# Patient Record
Sex: Male | Born: 1935
Health system: Southern US, Community
[De-identification: ages and names within clinical notes are randomized; demographics above are authoritative.]

## PROBLEM LIST (undated history)

## (undated) DIAGNOSIS — M1990 Unspecified osteoarthritis, unspecified site: Secondary | ICD-10-CM

## (undated) DIAGNOSIS — E785 Hyperlipidemia, unspecified: Secondary | ICD-10-CM

## (undated) DIAGNOSIS — R011 Cardiac murmur, unspecified: Secondary | ICD-10-CM

## (undated) DIAGNOSIS — K635 Polyp of colon: Secondary | ICD-10-CM

## (undated) DIAGNOSIS — I4891 Unspecified atrial fibrillation: Secondary | ICD-10-CM

## (undated) DIAGNOSIS — I2692 Saddle embolus of pulmonary artery without acute cor pulmonale: Principal | ICD-10-CM

## (undated) DIAGNOSIS — I10 Essential (primary) hypertension: Secondary | ICD-10-CM

## (undated) DIAGNOSIS — I714 Abdominal aortic aneurysm, without rupture: Secondary | ICD-10-CM

## (undated) HISTORY — DX: Saddle embolus of pulmonary artery without acute cor pulmonale: I26.92

## (undated) HISTORY — DX: Hyperlipidemia, unspecified: E78.5

## (undated) HISTORY — DX: Polyp of colon: K63.5

## (undated) HISTORY — DX: Unspecified atrial fibrillation: I48.91

## (undated) HISTORY — PX: TONSILLECTOMY: SUR1361

## (undated) HISTORY — DX: Abdominal aortic aneurysm, without rupture: I71.4

## (undated) HISTORY — DX: Cardiac murmur, unspecified: R01.1

## (undated) HISTORY — DX: Unspecified osteoarthritis, unspecified site: M19.90

## (undated) HISTORY — DX: Essential (primary) hypertension: I10

---

## 1998-08-09 ENCOUNTER — Ambulatory Visit (HOSPITAL_COMMUNITY): Admission: RE | Admit: 1998-08-09 | Discharge: 1998-08-09 | Payer: Self-pay | Admitting: Family Medicine

## 1998-08-10 ENCOUNTER — Ambulatory Visit (HOSPITAL_COMMUNITY): Admission: RE | Admit: 1998-08-10 | Discharge: 1998-08-10 | Payer: Self-pay | Admitting: *Deleted

## 1998-08-10 ENCOUNTER — Encounter: Payer: Self-pay | Admitting: Family Medicine

## 1998-09-20 ENCOUNTER — Ambulatory Visit (HOSPITAL_COMMUNITY): Admission: RE | Admit: 1998-09-20 | Discharge: 1998-09-20 | Payer: Self-pay | Admitting: Gastroenterology

## 1998-09-20 ENCOUNTER — Encounter: Payer: Self-pay | Admitting: Gastroenterology

## 1998-10-25 ENCOUNTER — Ambulatory Visit (HOSPITAL_COMMUNITY): Admission: RE | Admit: 1998-10-25 | Discharge: 1998-10-25 | Payer: Self-pay | Admitting: Gastroenterology

## 2008-08-03 ENCOUNTER — Encounter: Admission: RE | Admit: 2008-08-03 | Discharge: 2008-08-03 | Payer: Self-pay | Admitting: Family Medicine

## 2008-08-16 ENCOUNTER — Encounter
Admission: RE | Admit: 2008-08-16 | Discharge: 2008-10-06 | Payer: Self-pay | Admitting: Physical Medicine and Rehabilitation

## 2009-03-08 ENCOUNTER — Encounter: Admission: RE | Admit: 2009-03-08 | Discharge: 2009-04-28 | Payer: Self-pay | Admitting: Family Medicine

## 2009-05-10 ENCOUNTER — Encounter (INDEPENDENT_AMBULATORY_CARE_PROVIDER_SITE_OTHER): Payer: Self-pay | Admitting: *Deleted

## 2010-05-30 NOTE — Letter (Signed)
Summary: Colonoscopy Date Change Letter  [facility]  [address] [place], [place] [address]   Phone: [phone]  Fax: [phone]      May 10, 2009 MRN: [id number]   Jerry Fox [address] [place], [place]  [address]   Dear [name],   Previously you were recommended to have a repeat colonoscopy around this time. Your chart was recently reviewed by Dr. Judie Petit T. Russella Dar of [facility]. Follow up colonoscopy is now recommended in February 2014. This revised recommendation is based on current, nationally recognized guidelines for colorectal cancer screening and polyp surveillance. These guidelines are endorsed by the American Cancer Society, [employer] on Colorectal Cancer as well as numerous other major medical organizations.  Please understand that our recommendation assumes that you do not have any new symptoms such as bleeding, a change in bowel habits, anemia, or significant abdominal discomfort. If you do have any concerning GI symptoms or want to discuss the guideline recommendations, please call to arrange an office visit at your earliest convenience. Otherwise we will keep you in our reminder system and contact you 1-2 months prior to the date listed above to schedule your next colonoscopy.  Thank you,  Judie Petit T. Russella Dar, M.D.  [facility] [phone]

## 2010-06-13 ENCOUNTER — Other Ambulatory Visit: Payer: Self-pay | Admitting: Nurse Practitioner

## 2010-06-13 DIAGNOSIS — M25512 Pain in left shoulder: Secondary | ICD-10-CM

## 2010-06-17 ENCOUNTER — Other Ambulatory Visit: Payer: Self-pay

## 2010-07-08 ENCOUNTER — Ambulatory Visit
Admission: RE | Admit: 2010-07-08 | Discharge: 2010-07-08 | Disposition: A | Discharge status: Home / Self Care | Payer: BC Managed Care – PPO | Source: Ambulatory Visit | Attending: Nurse Practitioner | Admitting: Nurse Practitioner

## 2010-07-08 DIAGNOSIS — M25512 Pain in left shoulder: Secondary | ICD-10-CM

## 2011-02-01 ENCOUNTER — Ambulatory Visit (INDEPENDENT_AMBULATORY_CARE_PROVIDER_SITE_OTHER): Payer: Medicare Other | Admitting: Family Medicine

## 2011-02-01 ENCOUNTER — Encounter: Payer: Self-pay | Admitting: Family Medicine

## 2011-02-01 VITALS — BP 160/90 | HR 72 | Temp 97.5°F | Resp 12 | Ht 68.75 in | Wt 192.0 lb

## 2011-02-01 DIAGNOSIS — E785 Hyperlipidemia, unspecified: Secondary | ICD-10-CM | POA: Insufficient documentation

## 2011-02-01 DIAGNOSIS — M545 Low back pain, unspecified: Secondary | ICD-10-CM

## 2011-02-01 DIAGNOSIS — G8929 Other chronic pain: Secondary | ICD-10-CM

## 2011-02-01 DIAGNOSIS — Z23 Encounter for immunization: Secondary | ICD-10-CM

## 2011-02-01 DIAGNOSIS — M1990 Unspecified osteoarthritis, unspecified site: Secondary | ICD-10-CM | POA: Insufficient documentation

## 2011-02-01 DIAGNOSIS — I10 Essential (primary) hypertension: Secondary | ICD-10-CM | POA: Insufficient documentation

## 2011-02-01 LAB — BASIC METABOLIC PANEL
CO2: 32 mEq/L (ref 19–32)
Calcium: 10 mg/dL (ref 8.4–10.5)
Chloride: 102 mEq/L (ref 96–112)
Glucose, Bld: 110 mg/dL — ABNORMAL HIGH (ref 70–99)
Sodium: 141 mEq/L (ref 135–145)

## 2011-02-01 LAB — LIPID PANEL
HDL: 50.7 mg/dL (ref >39.00)
LDL Cholesterol: 95 mg/dL (ref 0–99)
Total CHOL/HDL Ratio: 3
VLDL: 24.2 mg/dL (ref 0.0–40.0)

## 2011-02-01 LAB — HEPATIC FUNCTION PANEL: Total Bilirubin: 0.8 mg/dL (ref 0.3–1.2)

## 2011-02-01 MED ORDER — LISINOPRIL 10 MG PO TABS: 10.0000 mg | ORAL_TABLET | Freq: Every day | ORAL | Status: DC

## 2011-02-01 NOTE — Progress Notes (Signed)
Subjective:    Patient ID: Jerry Fox, male    DOB: 1933-11-19, 77 y.o.   MRN: [id number]  HPI New to establish care. Past medical history reviewed. History of osteoarthritis, chronic low back pain, hypertension, hyperlipidemia, reported history of positive PPD treated and history of colon polyps. Medications reviewed. Surgical history tonsillectomy in childhood.  Blood pressures have been running slightly high at home 140s to 150s systolic. No headaches or dizziness. Patient gives what sounds like remote history of nonobstructive CAD by catheterization several years ago. No lab work in about a year. Takes lovastatin for hyperlipidemia. No myalgias. Osteoarthritis mostly involving the lower lumbar region and hands.  Family history significant for both parents with coronary disease. Father hypertension.  Patient is retired from [employer]. Nonsmoker. Did smoke and quit around age 75. No alcohol use. Last tetanus 5 years ago. Pneumovax 3 years ago. No flu vaccine yet.  Past Medical History  Diagnosis Date  . Arthritis   . Murmur, cardiac   . Hypertension   . Colon polyps    Past Surgical History  Procedure Date  . Tonsillectomy     reports that he quit smoking about 57 years ago. His smoking use included Cigarettes. He has a 10 pack-year smoking history. He quit smokeless tobacco use about 47 years ago. His smokeless tobacco use included Chew. His alcohol and drug histories not on file. family history includes Arthritis in his father and mother; Heart disease in his father, mother, and paternal grandfather; and Hypertension in his father. No Known Allergies    Review of Systems  Constitutional: Negative for fever, chills, appetite change, fatigue and unexpected weight change.  HENT: Negative for trouble swallowing.   Eyes: Negative for visual disturbance.  Respiratory: Negative for cough and shortness of breath.   Cardiovascular: Negative for chest pain, palpitations  and leg swelling.  Gastrointestinal: Negative for abdominal pain.  Genitourinary: Negative for dysuria.  Musculoskeletal: Positive for back pain and arthralgias. Negative for myalgias and gait problem.  Skin: Negative for rash.  Neurological: Negative for dizziness, syncope and headaches.  Hematological: Negative for adenopathy.  Psychiatric/Behavioral: Negative for dysphoric mood.       Objective:   Physical Exam  Constitutional: He is oriented to person, place, and time. He appears well-developed and well-nourished.  HENT:  Right Ear: External ear normal.  Left Ear: External ear normal.  Mouth/Throat: Oropharynx is clear and moist.  Neck: Neck supple. No thyromegaly present.  Cardiovascular: Normal rate and regular rhythm.  Exam reveals no gallop.   Pulmonary/Chest: Effort normal and breath sounds normal. No respiratory distress. He has no wheezes. He has no rales.  Abdominal: Soft. There is no tenderness.  Musculoskeletal: He exhibits no edema.  Lymphadenopathy:    He has no cervical adenopathy.  Neurological: He is alert and oriented to person, place, and time.  Psychiatric: He has a normal mood and affect. His behavior is normal.          Assessment & Plan:  #1 hyperlipidemia. Check lipid and hepatic panel #2 hypertension suboptimally controlled. Add lisinopril 10 mg daily and reassess blood pressure approximately 1 month #3 osteoarthritis involving mostly low back and hands. Patient's been maintained on hydrocodone for years and takes intermittent diazepam for muscle spasm. Send for old records. We explained if we take over pain medication have to have clear change of command regarding who is in charge of that and only through one practice and one provider much as possible.  #4 health  maintenance. Flu vaccine recommended and patient consents

## 2011-02-02 NOTE — Progress Notes (Signed)
Quick Note:  Pt informed ______ 

## 2011-03-06 ENCOUNTER — Ambulatory Visit (INDEPENDENT_AMBULATORY_CARE_PROVIDER_SITE_OTHER): Payer: Medicare Other | Admitting: Family Medicine

## 2011-03-06 ENCOUNTER — Encounter: Payer: Self-pay | Admitting: Family Medicine

## 2011-03-06 VITALS — BP 142/80 | Temp 97.7°F | Wt 194.0 lb

## 2011-03-06 DIAGNOSIS — I10 Essential (primary) hypertension: Secondary | ICD-10-CM

## 2011-03-06 NOTE — Progress Notes (Signed)
  Subjective:    Patient ID: Jerry Fox, male    DOB: Jul 18, 1933, 77 y.o.   MRN: [id number]  HPI  Patient seen for followup hypertension. Added lisinopril last visit. No cough or other side effect. Blood pressure is improved with home readings consistently less than 140 systolic. No dizziness or headache. Compliant with all medications. He is trying to taper back his diazepam which he has taken for back muscle spasms in the past. Has already received flu vaccine.  Review of Systems  Constitutional: Negative for fatigue.  Eyes: Negative for visual disturbance.  Respiratory: Negative for cough, chest tightness and shortness of breath.   Cardiovascular: Negative for chest pain, palpitations and leg swelling.  Neurological: Negative for dizziness, syncope, weakness, light-headedness and headaches.       Objective:   Physical Exam  Constitutional: He appears well-developed and well-nourished. No distress.  HENT:  Mouth/Throat: Oropharynx is clear and moist.  Neck: Neck supple. No thyromegaly present.  Cardiovascular: Normal rate and regular rhythm.   Pulmonary/Chest: Effort normal and breath sounds normal. No respiratory distress. He has no wheezes. He has no rales.  Musculoskeletal: He exhibits no edema.  Lymphadenopathy:    He has no cervical adenopathy.          Assessment & Plan:  Hypertension. Improved. Continue current medications. Continue close home monitoring. Routine follow up 6 months.

## 2011-03-06 NOTE — Patient Instructions (Signed)
Keep monitoring blood pressure and be in touch if BP > 140/90.

## 2011-04-12 ENCOUNTER — Other Ambulatory Visit: Payer: Self-pay | Admitting: Family Medicine

## 2011-04-12 NOTE — Telephone Encounter (Signed)
Has taken diazepam and hydrocodone intermittently and rarely for back pain flares May refill diazepam 5 mg one po bid prn muscle spasm #30 with one refill and Norco 5/325 mg 1-2 po q6 hours prn pain #30 with one refill. Refill other requested meds for 6 months.

## 2011-04-12 NOTE — Telephone Encounter (Signed)
Pt has not had these meds filled in [facility] or [facility] by [place], Valium and Hydrocodone Please advise

## 2011-04-12 NOTE — Telephone Encounter (Signed)
Pt called req refills of diazepam (VALIUM) 5 MG tablet, hydrochlorothiazide (HYDRODIURIL) 25 MG tablet,HYDROcodone-acetaminophen (NORCO) 5-325 MG per tablet ,lovastatin (MEVACOR) 20 MG tablet ,metoprolol tartrate (LOPRESSOR) 25 MG tablet to [facility] in [place].

## 2011-04-13 MED ORDER — HYDROCHLOROTHIAZIDE 25 MG PO TABS: 25.0000 mg | ORAL_TABLET | Freq: Every day | ORAL | Status: DC

## 2011-04-13 MED ORDER — DIAZEPAM 5 MG PO TABS: 5.0000 mg | ORAL_TABLET | Freq: Four times a day (QID) | ORAL | Status: DC | PRN

## 2011-04-13 MED ORDER — HYDROCODONE-ACETAMINOPHEN 5-325 MG PO TABS: 1.0000 | ORAL_TABLET | Freq: Four times a day (QID) | ORAL | Status: DC | PRN

## 2011-04-13 MED ORDER — METOPROLOL TARTRATE 25 MG PO TABS: 25.0000 mg | ORAL_TABLET | Freq: Every day | ORAL | Status: DC

## 2011-04-13 MED ORDER — LOVASTATIN 20 MG PO TABS: 20.0000 mg | ORAL_TABLET | Freq: Every day | ORAL | Status: DC

## 2011-04-13 MED ORDER — LISINOPRIL 10 MG PO TABS: 10.0000 mg | ORAL_TABLET | Freq: Every day | ORAL | Status: DC

## 2011-05-31 ENCOUNTER — Other Ambulatory Visit: Payer: Self-pay | Admitting: *Deleted

## 2011-05-31 NOTE — Telephone Encounter (Signed)
Diazepam last filled on 04-13-11, #30 with 1 refill

## 2011-06-05 NOTE — Telephone Encounter (Signed)
Refill for 3 months. 

## 2011-06-06 MED ORDER — DIAZEPAM 5 MG PO TABS: 5.0000 mg | ORAL_TABLET | Freq: Four times a day (QID) | ORAL | Status: DC | PRN

## 2011-06-19 ENCOUNTER — Telehealth: Payer: Self-pay | Admitting: Family Medicine

## 2011-06-19 NOTE — Telephone Encounter (Signed)
Follow up to discuss.

## 2011-06-19 NOTE — Telephone Encounter (Signed)
HYDROcodone-acetaminophen - pt wants to get dosage increased say still having a lot of pain//also same request for muscle relaxer. Or is there something else he can try?

## 2011-06-19 NOTE — Telephone Encounter (Signed)
Please advise 

## 2011-06-20 NOTE — Telephone Encounter (Signed)
Pt wife informed

## 2011-06-27 ENCOUNTER — Encounter: Payer: Self-pay | Admitting: Family Medicine

## 2011-06-27 ENCOUNTER — Ambulatory Visit (INDEPENDENT_AMBULATORY_CARE_PROVIDER_SITE_OTHER): Payer: Medicare Other | Admitting: Family Medicine

## 2011-06-27 DIAGNOSIS — I10 Essential (primary) hypertension: Secondary | ICD-10-CM

## 2011-06-27 DIAGNOSIS — M545 Low back pain, unspecified: Secondary | ICD-10-CM

## 2011-06-27 DIAGNOSIS — E785 Hyperlipidemia, unspecified: Secondary | ICD-10-CM

## 2011-06-27 DIAGNOSIS — M1990 Unspecified osteoarthritis, unspecified site: Secondary | ICD-10-CM

## 2011-06-27 DIAGNOSIS — G8929 Other chronic pain: Secondary | ICD-10-CM

## 2011-06-27 MED ORDER — DIAZEPAM 5 MG PO TABS: 5.0000 mg | ORAL_TABLET | Freq: Four times a day (QID) | ORAL | Status: DC | PRN

## 2011-06-27 MED ORDER — HYDROCODONE-ACETAMINOPHEN 5-325 MG PO TABS: 1.0000 | ORAL_TABLET | Freq: Four times a day (QID) | ORAL | Status: DC | PRN

## 2011-06-27 NOTE — Progress Notes (Signed)
  Subjective:    Patient ID: Jerry Fox, male    DOB: 02/26/1934, 78 y.o.   MRN: [id number]  HPI  Medical followup. Patient has history of hypertension, hyperlipidemia, and osteoarthritis mostly involving lumbar and cervical spine. Has had previous x-rays including MRI scans. He has been on regimen of diazepam and hydrocodone which he takes bid. He has frequent muscle spasms in his back mostly lumbar area. Diazepam seems to working well. No problems with gait. No sedation issues. No history of misuse. Generally takes one hydrocodone twice daily. Without this he had severe sleep difficulty. Previous intolerance to nonsteroidals. No relief with Tylenol.  Hypertension treated with lisinopril, HCTZ, and metoprolol. Blood pressures been well controlled by home readings. Lipids were checked last fall and at goal  Past Medical History  Diagnosis Date  . Arthritis   . Murmur, cardiac   . Hypertension   . Colon polyps    Past Surgical History  Procedure Date  . Tonsillectomy     reports that he quit smoking about 57 years ago. His smoking use included Cigarettes. He has a 10 pack-year smoking history. He quit smokeless tobacco use about 47 years ago. His smokeless tobacco use included Chew. His alcohol and drug histories not on file. family history includes Arthritis in his father and mother; Heart disease in his father, mother, and paternal grandfather; and Hypertension in his father. No Known Allergies    Review of Systems  HENT: Positive for neck pain.   Respiratory: Negative for cough and shortness of breath.   Cardiovascular: Negative for chest pain, palpitations and leg swelling.  Gastrointestinal: Negative for abdominal pain.  Musculoskeletal: Positive for back pain.  Neurological: Negative for dizziness.       Objective:   Physical Exam  Constitutional: He is oriented to person, place, and time. He appears well-developed and well-nourished.  Neck: Neck supple. No  thyromegaly present.  Cardiovascular: Normal rate and regular rhythm.   Pulmonary/Chest: Effort normal and breath sounds normal. No respiratory distress. He has no wheezes. He has no rales.  Musculoskeletal: He exhibits no edema.  Lymphadenopathy:    He has no cervical adenopathy.  Neurological: He is alert and oriented to person, place, and time.          Assessment & Plan:  #1Osteoarthritis involving cervical and lumbar spine. We've refilled his diazepam and hydrocodone. He knows to get this through one office only. Cautioned about potential side effects.  #2 hypertension stable #3 hyperlipidemia recent lipids reviewed with patient and at goal. Repeat lipid and hepatic panel in 6 months

## 2011-09-03 ENCOUNTER — Ambulatory Visit: Payer: Medicare Other | Admitting: Family Medicine

## 2011-11-21 ENCOUNTER — Encounter: Payer: Self-pay | Admitting: Family Medicine

## 2011-11-21 ENCOUNTER — Ambulatory Visit (INDEPENDENT_AMBULATORY_CARE_PROVIDER_SITE_OTHER): Payer: Medicare Other | Admitting: Family Medicine

## 2011-11-21 VITALS — BP 120/68 | Temp 97.7°F | Wt 198.0 lb

## 2011-11-21 DIAGNOSIS — G8929 Other chronic pain: Secondary | ICD-10-CM

## 2011-11-21 DIAGNOSIS — R5383 Other fatigue: Secondary | ICD-10-CM

## 2011-11-21 DIAGNOSIS — R5381 Other malaise: Secondary | ICD-10-CM

## 2011-11-21 DIAGNOSIS — M1990 Unspecified osteoarthritis, unspecified site: Secondary | ICD-10-CM

## 2011-11-21 DIAGNOSIS — I10 Essential (primary) hypertension: Secondary | ICD-10-CM

## 2011-11-21 DIAGNOSIS — M545 Low back pain: Secondary | ICD-10-CM

## 2011-11-21 DIAGNOSIS — E785 Hyperlipidemia, unspecified: Secondary | ICD-10-CM

## 2011-11-21 LAB — LIPID PANEL
Cholesterol: 143 mg/dL (ref 0–200)
Total CHOL/HDL Ratio: 3
Triglycerides: 140 mg/dL (ref 0.0–149.0)

## 2011-11-21 LAB — HEPATIC FUNCTION PANEL
AST: 22 U/L (ref 0–37)
Albumin: 4.1 g/dL (ref 3.5–5.2)
Alkaline Phosphatase: 51 U/L (ref 39–117)
Total Protein: 7.2 g/dL (ref 6.0–8.3)

## 2011-11-21 LAB — BASIC METABOLIC PANEL
Calcium: 9.6 mg/dL (ref 8.4–10.5)
Creatinine, Ser: 1 mg/dL (ref 0.4–1.5)
GFR: 77.23 mL/min (ref >60.00)

## 2011-11-21 LAB — TESTOSTERONE: Testosterone: 385.2 ng/dL (ref 350.00–890.00)

## 2011-11-21 MED ORDER — TRAMADOL HCL 50 MG PO TABS: ORAL_TABLET | ORAL | Status: DC

## 2011-11-21 NOTE — Progress Notes (Signed)
  Subjective:    Patient ID: Jerry Fox, male    DOB: February 06, 1934, 78 y.o.   MRN: [id number]  HPI  Medical followup. Patient has history of chronic low back pain secondary to osteoarthritis, hyperlipidemia, hypertension. Medications reviewed. Low back pain tends to be worse at night. He generally takes 2 hydrocodone per day and also diazepam as needed. He's recently had some increased low back pain at night. Does not call trying tramadol previously. Tries to avoid regular nonsteroidal use because of his age and risk of GI problems.  Blood pressures been well controlled with lisinopril. He also takes Lopressor. No side effects. No recent chest pains. Had one episode recently with golf where he felt overheated and had elevated pulse rate but no chest pain. Felt dizzy and symptoms resolved with fluids and cooling down.  Hyperlipidemia treated with lovastatin. Due for repeat lipids. No myalgias.  Pt c/o fatigue but normal libido and no ED issues.  He is requesting testosterone levels.  Past Medical History  Diagnosis Date  . Arthritis   . Murmur, cardiac   . Hypertension   . Colon polyps    Past Surgical History  Procedure Date  . Tonsillectomy     reports that he quit smoking about 57 years ago. His smoking use included Cigarettes. He has a 10 pack-year smoking history. He quit smokeless tobacco use about 47 years ago. His smokeless tobacco use included Chew. His alcohol and drug histories not on file. family history includes Arthritis in his father and mother; Heart disease in his father, mother, and paternal grandfather; and Hypertension in his father. No Known Allergies    Review of Systems  Constitutional: Negative for fatigue.  Eyes: Negative for visual disturbance.  Respiratory: Negative for cough, chest tightness and shortness of breath.   Cardiovascular: Negative for chest pain, palpitations and leg swelling.  Neurological: Negative for dizziness, syncope, weakness,  light-headedness and headaches.       Objective:   Physical Exam  Constitutional: He is oriented to person, place, and time. He appears well-developed and well-nourished.  Neck: Neck supple. No thyromegaly present.  Cardiovascular: Normal rate and regular rhythm.   Pulmonary/Chest: Effort normal and breath sounds normal. No respiratory distress. He has no wheezes. He has no rales.  Musculoskeletal: He exhibits no edema.  Neurological: He is alert and oriented to person, place, and time. No cranial nerve deficit.          Assessment & Plan:  #1 chronic low back pain. Try tramadol 50 mg one to 2 every 6 hours when necessary. Try to avoid escalation in hydrocodone use if possible #2 hypertension well controlled. Check basic metabolic panel #3 hyperlipidemia. Check lipid and hepatic panel

## 2011-11-22 ENCOUNTER — Encounter: Payer: Self-pay | Admitting: Family Medicine

## 2011-11-22 NOTE — Progress Notes (Signed)
Quick Note:  Pt informed, copy mailed to home ______ 

## 2011-12-20 ENCOUNTER — Other Ambulatory Visit: Payer: Self-pay | Admitting: *Deleted

## 2011-12-20 MED ORDER — HYDROCODONE-ACETAMINOPHEN 5-325 MG PO TABS: 1.0000 | ORAL_TABLET | Freq: Four times a day (QID) | ORAL | Status: DC | PRN

## 2011-12-20 MED ORDER — DIAZEPAM 5 MG PO TABS: 5.0000 mg | ORAL_TABLET | Freq: Four times a day (QID) | ORAL | Status: DC | PRN

## 2011-12-26 ENCOUNTER — Ambulatory Visit: Payer: Medicare Other | Admitting: Family Medicine

## 2012-01-07 ENCOUNTER — Other Ambulatory Visit: Payer: Self-pay | Admitting: *Deleted

## 2012-01-07 MED ORDER — LISINOPRIL 10 MG PO TABS: 10.0000 mg | ORAL_TABLET | Freq: Every day | ORAL | Status: DC

## 2012-01-07 MED ORDER — METOPROLOL TARTRATE 25 MG PO TABS: 25.0000 mg | ORAL_TABLET | Freq: Every day | ORAL | Status: DC

## 2012-01-07 MED ORDER — LOVASTATIN 20 MG PO TABS: 20.0000 mg | ORAL_TABLET | Freq: Every day | ORAL | Status: DC

## 2012-01-07 MED ORDER — HYDROCHLOROTHIAZIDE 25 MG PO TABS: 25.0000 mg | ORAL_TABLET | Freq: Every day | ORAL | Status: DC

## 2012-04-01 ENCOUNTER — Telehealth: Payer: Self-pay | Admitting: *Deleted

## 2012-04-01 DIAGNOSIS — G8929 Other chronic pain: Secondary | ICD-10-CM

## 2012-04-01 MED ORDER — TRAMADOL HCL 50 MG PO TABS: ORAL_TABLET | ORAL | Status: DC

## 2012-04-01 NOTE — Telephone Encounter (Signed)
Refill for 6 months. 

## 2012-04-01 NOTE — Telephone Encounter (Signed)
Diazepam refill request, last filled 12-20-11, #60 with 3 refills

## 2012-04-02 MED ORDER — DIAZEPAM 5 MG PO TABS: 5.0000 mg | ORAL_TABLET | Freq: Four times a day (QID) | ORAL | Status: DC | PRN

## 2012-05-07 ENCOUNTER — Other Ambulatory Visit: Payer: Self-pay | Admitting: *Deleted

## 2012-05-07 DIAGNOSIS — G8929 Other chronic pain: Secondary | ICD-10-CM

## 2012-05-07 MED ORDER — TRAMADOL HCL 50 MG PO TABS: ORAL_TABLET | ORAL | Status: DC

## 2012-05-16 ENCOUNTER — Encounter: Payer: Self-pay | Admitting: Gastroenterology

## 2012-05-23 ENCOUNTER — Ambulatory Visit: Payer: Medicare Other | Admitting: Family Medicine

## 2012-05-23 ENCOUNTER — Encounter: Payer: Self-pay | Admitting: Family Medicine

## 2012-05-23 ENCOUNTER — Telehealth: Payer: Self-pay | Admitting: Family Medicine

## 2012-05-23 ENCOUNTER — Ambulatory Visit (INDEPENDENT_AMBULATORY_CARE_PROVIDER_SITE_OTHER): Payer: PRIVATE HEALTH INSURANCE | Admitting: Family Medicine

## 2012-05-23 VITALS — BP 130/74 | Temp 97.9°F | Wt 200.0 lb

## 2012-05-23 DIAGNOSIS — M545 Low back pain: Secondary | ICD-10-CM

## 2012-05-23 DIAGNOSIS — I10 Essential (primary) hypertension: Secondary | ICD-10-CM

## 2012-05-23 DIAGNOSIS — G8929 Other chronic pain: Secondary | ICD-10-CM

## 2012-05-23 DIAGNOSIS — E785 Hyperlipidemia, unspecified: Secondary | ICD-10-CM

## 2012-05-23 NOTE — Progress Notes (Signed)
  Subjective:    Patient ID: Jerry Fox, male    DOB: 1933-12-27, 79 y.o.   MRN: [id number]  HPI Medical followup. His history of hypertension and hyperlipidemia. Medications reviewed. Blood pressure stable. No orthostasis. Lipids were very well controlled when checked last summer. No history of CAD. No recent chest pains. In consistent exercise.  Chronic low back pain. Essentially unchanged. He takes tramadol and supplement with hydrocodone generally daily but his pain has been poorly controlled with this. Takes diazepam for muscle spasms and taking this for years. No recent falls. Rare episodes of constipation. Pain is lower lumbar. No clear radiculopathy symptoms. No numbness. No weakness. No incontinence symptoms. Patient like to consider physical therapy for back  Past Medical History  Diagnosis Date  . Arthritis   . Murmur, cardiac   . Hypertension   . Colon polyps   . Hyperlipidemia    Past Surgical History  Procedure Date  . Tonsillectomy     reports that he quit smoking about 58 years ago. His smoking use included Cigarettes. He has a 10 pack-year smoking history. He quit smokeless tobacco use about 48 years ago. His smokeless tobacco use included Chew. His alcohol and drug histories not on file. family history includes Arthritis in his father and mother; Heart disease in his father, mother, and paternal grandfather; and Hypertension in his father. No Known Allergies    Review of Systems  Constitutional: Negative for fever, activity change, appetite change, fatigue and unexpected weight change.  Eyes: Negative for visual disturbance.  Respiratory: Negative for cough, chest tightness and shortness of breath.   Cardiovascular: Negative for chest pain, palpitations and leg swelling.  Gastrointestinal: Negative for vomiting and abdominal pain.  Genitourinary: Negative for dysuria, hematuria and flank pain.  Musculoskeletal: Positive for back pain. Negative for joint  swelling.  Neurological: Negative for dizziness, syncope, weakness, light-headedness, numbness and headaches.       Objective:   Physical Exam  Constitutional: He appears well-developed and well-nourished.  Neck: Neck supple.  Cardiovascular: Normal rate and regular rhythm.   Pulmonary/Chest: Effort normal and breath sounds normal. No respiratory distress. He has no wheezes. He has no rales.  Musculoskeletal: He exhibits no edema.       Straight leg raises are negative bilaterally. No peripheral edema  Lymphadenopathy:    He has no cervical adenopathy.  Neurological:       Full-strength lower extremities. 2+ reflexes knee and ankle bilaterally          Assessment & Plan:  #1 hypertension. Stable at goal. Continue current medications. Check basic metabolic panel at followup #2 hyperlipidemia. Check lipids and hepatic panel in 6 months. Continue lovastatin  #3 chronic low back pain. Nonfocal neurologic exam. Set up physical therapy. Work on weight loss. He is requesting taking up to 3 hydrocodone daily. Continue tramadol and try to minimize escalation of hydrocodone.

## 2012-05-23 NOTE — Telephone Encounter (Signed)
We also just received 2 faxes from pt pharmacy.  1.) Diazepam 5mg  #60, pt said you increased to #90 today?  2.)  Hydrocodone/APAP 5-325 #60, pt said you increased to #90 today?

## 2012-05-23 NOTE — Telephone Encounter (Signed)
Pt was seen today and forgot to tell Doc he is having problem with gerd. [facility] [place]

## 2012-05-23 NOTE — Telephone Encounter (Signed)
We did NOT recommend increasing Diazepam to TID-especially at his age-increased risk of falls. We can increase his Hydrocodone to TID.

## 2012-05-23 NOTE — Patient Instructions (Addendum)
We will call you regarding setting up physical therapy.

## 2012-05-27 MED ORDER — HYDROCODONE-ACETAMINOPHEN 5-325 MG PO TABS: 1.0000 | ORAL_TABLET | Freq: Three times a day (TID) | ORAL | Status: DC | PRN

## 2012-05-28 ENCOUNTER — Ambulatory Visit: Payer: PRIVATE HEALTH INSURANCE | Admitting: Physical Therapy

## 2012-05-31 DIAGNOSIS — I714 Abdominal aortic aneurysm, without rupture, unspecified: Secondary | ICD-10-CM

## 2012-05-31 DIAGNOSIS — I2692 Saddle embolus of pulmonary artery without acute cor pulmonale: Secondary | ICD-10-CM

## 2012-05-31 DIAGNOSIS — I4891 Unspecified atrial fibrillation: Secondary | ICD-10-CM

## 2012-05-31 HISTORY — DX: Saddle embolus of pulmonary artery without acute cor pulmonale: I26.92

## 2012-05-31 HISTORY — DX: Abdominal aortic aneurysm, without rupture: I71.4

## 2012-05-31 HISTORY — DX: Abdominal aortic aneurysm, without rupture, unspecified: I71.40

## 2012-05-31 HISTORY — DX: Unspecified atrial fibrillation: I48.91

## 2012-06-10 ENCOUNTER — Emergency Department (HOSPITAL_COMMUNITY): Payer: Medicare Other

## 2012-06-10 ENCOUNTER — Encounter: Payer: Self-pay | Admitting: Family Medicine

## 2012-06-10 ENCOUNTER — Telehealth: Payer: Self-pay | Admitting: Family Medicine

## 2012-06-10 ENCOUNTER — Inpatient Hospital Stay (HOSPITAL_COMMUNITY)
Admission: EM | Admit: 2012-06-10 | Discharge: 2012-06-15 | DRG: 175 | Disposition: A | Discharge status: Home / Self Care | Payer: Medicare Other | Attending: Internal Medicine | Admitting: Internal Medicine

## 2012-06-10 ENCOUNTER — Ambulatory Visit (INDEPENDENT_AMBULATORY_CARE_PROVIDER_SITE_OTHER): Payer: PRIVATE HEALTH INSURANCE | Admitting: Family Medicine

## 2012-06-10 ENCOUNTER — Encounter (HOSPITAL_COMMUNITY): Payer: Self-pay | Admitting: Physical Medicine and Rehabilitation

## 2012-06-10 VITALS — BP 138/78 | HR 107 | Temp 98.6°F | Wt 204.0 lb

## 2012-06-10 DIAGNOSIS — I2692 Saddle embolus of pulmonary artery without acute cor pulmonale: Secondary | ICD-10-CM | POA: Diagnosis present

## 2012-06-10 DIAGNOSIS — I272 Pulmonary hypertension, unspecified: Secondary | ICD-10-CM

## 2012-06-10 DIAGNOSIS — Z79899 Other long term (current) drug therapy: Secondary | ICD-10-CM

## 2012-06-10 DIAGNOSIS — J9601 Acute respiratory failure with hypoxia: Secondary | ICD-10-CM

## 2012-06-10 DIAGNOSIS — I714 Abdominal aortic aneurysm, without rupture, unspecified: Secondary | ICD-10-CM | POA: Diagnosis present

## 2012-06-10 DIAGNOSIS — K5900 Constipation, unspecified: Secondary | ICD-10-CM | POA: Diagnosis present

## 2012-06-10 DIAGNOSIS — R0781 Pleurodynia: Secondary | ICD-10-CM | POA: Diagnosis present

## 2012-06-10 DIAGNOSIS — J9600 Acute respiratory failure, unspecified whether with hypoxia or hypercapnia: Secondary | ICD-10-CM | POA: Diagnosis present

## 2012-06-10 DIAGNOSIS — Z7982 Long term (current) use of aspirin: Secondary | ICD-10-CM

## 2012-06-10 DIAGNOSIS — R1011 Right upper quadrant pain: Secondary | ICD-10-CM

## 2012-06-10 DIAGNOSIS — M1990 Unspecified osteoarthritis, unspecified site: Secondary | ICD-10-CM

## 2012-06-10 DIAGNOSIS — I4891 Unspecified atrial fibrillation: Secondary | ICD-10-CM | POA: Diagnosis present

## 2012-06-10 DIAGNOSIS — I10 Essential (primary) hypertension: Secondary | ICD-10-CM | POA: Diagnosis present

## 2012-06-10 DIAGNOSIS — M549 Dorsalgia, unspecified: Secondary | ICD-10-CM

## 2012-06-10 DIAGNOSIS — F17200 Nicotine dependence, unspecified, uncomplicated: Secondary | ICD-10-CM | POA: Diagnosis present

## 2012-06-10 DIAGNOSIS — M545 Low back pain: Secondary | ICD-10-CM

## 2012-06-10 DIAGNOSIS — D72819 Decreased white blood cell count, unspecified: Secondary | ICD-10-CM | POA: Diagnosis present

## 2012-06-10 DIAGNOSIS — I369 Nonrheumatic tricuspid valve disorder, unspecified: Secondary | ICD-10-CM

## 2012-06-10 DIAGNOSIS — D72829 Elevated white blood cell count, unspecified: Secondary | ICD-10-CM | POA: Diagnosis present

## 2012-06-10 DIAGNOSIS — I2699 Other pulmonary embolism without acute cor pulmonale: Principal | ICD-10-CM | POA: Diagnosis present

## 2012-06-10 DIAGNOSIS — I2789 Other specified pulmonary heart diseases: Secondary | ICD-10-CM | POA: Diagnosis present

## 2012-06-10 DIAGNOSIS — I2510 Atherosclerotic heart disease of native coronary artery without angina pectoris: Secondary | ICD-10-CM | POA: Diagnosis present

## 2012-06-10 DIAGNOSIS — E785 Hyperlipidemia, unspecified: Secondary | ICD-10-CM | POA: Diagnosis present

## 2012-06-10 HISTORY — DX: Unspecified atrial fibrillation: I48.91

## 2012-06-10 LAB — URINALYSIS, ROUTINE W REFLEX MICROSCOPIC
Ketones, ur: 15 mg/dL — AB
Nitrite: NEGATIVE
Protein, ur: 30 mg/dL — AB

## 2012-06-10 LAB — MRSA PCR SCREENING: MRSA by PCR: NEGATIVE

## 2012-06-10 LAB — URINE MICROSCOPIC-ADD ON

## 2012-06-10 LAB — COMPREHENSIVE METABOLIC PANEL
ALT: 15 U/L (ref 0–53)
AST: 17 U/L (ref 0–37)
Alkaline Phosphatase: 60 U/L (ref 39–117)
CO2: 30 mEq/L (ref 19–32)
Chloride: 94 mEq/L — ABNORMAL LOW (ref 96–112)
Creatinine, Ser: 0.89 mg/dL (ref 0.50–1.35)
GFR calc non Af Amer: 81 mL/min — ABNORMAL LOW (ref >90)
Potassium: 3.5 mEq/L (ref 3.5–5.1)
Total Bilirubin: 0.9 mg/dL (ref 0.3–1.2)

## 2012-06-10 LAB — CBC WITH DIFFERENTIAL/PLATELET
Basophils Absolute: 0 10*3/uL (ref 0.0–0.1)
HCT: 39.7 % (ref 39.0–52.0)
Hemoglobin: 13.6 g/dL (ref 13.0–17.0)
Lymphocytes Relative: 6 % — ABNORMAL LOW (ref 12–46)
Monocytes Absolute: 2.1 10*3/uL — ABNORMAL HIGH (ref 0.1–1.0)
Neutro Abs: 15.7 10*3/uL — ABNORMAL HIGH (ref 1.7–7.7)
RDW: 12.9 % (ref 11.5–15.5)
WBC: 19.1 10*3/uL — ABNORMAL HIGH (ref 4.0–10.5)

## 2012-06-10 LAB — GLUCOSE, CAPILLARY: Glucose-Capillary: 120 mg/dL — ABNORMAL HIGH (ref 70–99)

## 2012-06-10 LAB — POCT I-STAT TROPONIN I: Troponin i, poc: 0 ng/mL (ref 0.00–0.08)

## 2012-06-10 MED ORDER — PIPERACILLIN-TAZOBACTAM 3.375 G IVPB: 3.3750 g | Freq: Three times a day (TID) | INTRAVENOUS | Status: DC

## 2012-06-10 MED ORDER — HEPARIN (PORCINE) IN NACL 100-0.45 UNIT/ML-% IJ SOLN
1600.0000 [IU]/h | INTRAMUSCULAR | Status: DC
  Administered 2012-06-10: 1450 [IU]/h via INTRAVENOUS
  Administered 2012-06-11 (×2): 1600 [IU]/h via INTRAVENOUS
  Filled 2012-06-10 (×4): qty 250

## 2012-06-10 MED ORDER — METOPROLOL TARTRATE 25 MG PO TABS
25.0000 mg | ORAL_TABLET | Freq: Every day | ORAL | Status: DC
  Administered 2012-06-10 – 2012-06-11 (×2): 25 mg via ORAL
  Filled 2012-06-10 (×2): qty 1

## 2012-06-10 MED ORDER — ACETAMINOPHEN 650 MG RE SUPP: 650.0000 mg | Freq: Four times a day (QID) | RECTAL | Status: DC | PRN

## 2012-06-10 MED ORDER — OXYCODONE HCL 5 MG PO TABS
10.0000 mg | ORAL_TABLET | Freq: Once | ORAL | Status: AC
  Administered 2012-06-10: 10 mg via ORAL
  Filled 2012-06-10: qty 2

## 2012-06-10 MED ORDER — SODIUM CHLORIDE 0.9 % IJ SOLN
3.0000 mL | Freq: Two times a day (BID) | INTRAMUSCULAR | Status: DC
  Administered 2012-06-10: 3 mL via INTRAVENOUS

## 2012-06-10 MED ORDER — TRAMADOL HCL 50 MG PO TABS
50.0000 mg | ORAL_TABLET | Freq: Four times a day (QID) | ORAL | Status: DC | PRN
  Administered 2012-06-10 – 2012-06-13 (×2): 100 mg via ORAL
  Administered 2012-06-14 – 2012-06-15 (×2): 50 mg via ORAL
  Filled 2012-06-10: qty 2
  Filled 2012-06-10: qty 1
  Filled 2012-06-10: qty 2
  Filled 2012-06-10: qty 1

## 2012-06-10 MED ORDER — VANCOMYCIN HCL IN DEXTROSE 1-5 GM/200ML-% IV SOLN: 1000.0000 mg | Freq: Once | INTRAVENOUS | Status: DC

## 2012-06-10 MED ORDER — PIPERACILLIN-TAZOBACTAM 3.375 G IVPB 30 MIN: 3.3750 g | Freq: Once | INTRAVENOUS | Status: DC

## 2012-06-10 MED ORDER — ONDANSETRON HCL 4 MG PO TABS
4.0000 mg | ORAL_TABLET | Freq: Four times a day (QID) | ORAL | Status: DC | PRN
  Administered 2012-06-11: 4 mg via ORAL
  Filled 2012-06-10: qty 1

## 2012-06-10 MED ORDER — DILTIAZEM HCL 100 MG IV SOLR
2.0000 mg/h | Freq: Once | INTRAVENOUS | Status: AC
  Administered 2012-06-10: 2 mg/h via INTRAVENOUS

## 2012-06-10 MED ORDER — VANCOMYCIN HCL IN DEXTROSE 1-5 GM/200ML-% IV SOLN: 1000.0000 mg | Freq: Two times a day (BID) | INTRAVENOUS | Status: DC

## 2012-06-10 MED ORDER — SODIUM CHLORIDE 0.9 % IJ SOLN
3.0000 mL | Freq: Two times a day (BID) | INTRAMUSCULAR | Status: DC
  Administered 2012-06-10 – 2012-06-11 (×2): 3 mL via INTRAVENOUS

## 2012-06-10 MED ORDER — SODIUM CHLORIDE 0.9 % IV SOLN: 250.0000 mL | INTRAVENOUS | Status: DC | PRN

## 2012-06-10 MED ORDER — IOHEXOL 350 MG/ML SOLN
80.0000 mL | Freq: Once | INTRAVENOUS | Status: AC | PRN
  Administered 2012-06-10: 80 mL via INTRAVENOUS

## 2012-06-10 MED ORDER — MORPHINE SULFATE 2 MG/ML IJ SOLN
2.0000 mg | INTRAMUSCULAR | Status: DC | PRN
  Administered 2012-06-10 – 2012-06-11 (×10): 2 mg via INTRAVENOUS
  Filled 2012-06-10 (×10): qty 1

## 2012-06-10 MED ORDER — ONDANSETRON HCL 4 MG/2ML IJ SOLN: 4.0000 mg | Freq: Four times a day (QID) | INTRAMUSCULAR | Status: DC | PRN

## 2012-06-10 MED ORDER — ACETAMINOPHEN 325 MG PO TABS
650.0000 mg | ORAL_TABLET | Freq: Four times a day (QID) | ORAL | Status: DC | PRN
Start: 2012-06-10 — End: 2012-06-15

## 2012-06-10 MED ORDER — BIOTENE DRY MOUTH MT LIQD
15.0000 mL | Freq: Two times a day (BID) | OROMUCOSAL | Status: DC
  Administered 2012-06-10 – 2012-06-15 (×8): 15 mL via OROMUCOSAL

## 2012-06-10 MED ORDER — SODIUM CHLORIDE 0.9 % IV BOLUS (SEPSIS)
1000.0000 mL | Freq: Once | INTRAVENOUS | Status: AC
  Administered 2012-06-10: 1000 mL via INTRAVENOUS

## 2012-06-10 MED ORDER — SIMVASTATIN 5 MG PO TABS
5.0000 mg | ORAL_TABLET | Freq: Every day | ORAL | Status: DC
  Administered 2012-06-11 – 2012-06-14 (×4): 5 mg via ORAL
  Filled 2012-06-10 (×5): qty 1

## 2012-06-10 MED ORDER — SODIUM CHLORIDE 0.9 % IV SOLN
INTRAVENOUS | Status: AC
  Administered 2012-06-10: 20:00:00 via INTRAVENOUS

## 2012-06-10 MED ORDER — HEPARIN BOLUS VIA INFUSION
4000.0000 [IU] | Freq: Once | INTRAVENOUS | Status: AC
  Administered 2012-06-10: 4000 [IU] via INTRAVENOUS

## 2012-06-10 MED ORDER — LEVALBUTEROL HCL 0.63 MG/3ML IN NEBU
0.6300 mg | INHALATION_SOLUTION | Freq: Four times a day (QID) | RESPIRATORY_TRACT | Status: DC | PRN
  Administered 2012-06-11: 0.63 mg via RESPIRATORY_TRACT
  Filled 2012-06-10 (×2): qty 3

## 2012-06-10 MED ORDER — DIAZEPAM 2 MG PO TABS
2.0000 mg | ORAL_TABLET | Freq: Three times a day (TID) | ORAL | Status: DC | PRN
  Administered 2012-06-10 – 2012-06-12 (×2): 2 mg via ORAL
  Administered 2012-06-14: 5 mg via ORAL
  Filled 2012-06-10 (×2): qty 1
  Filled 2012-06-10: qty 2
  Filled 2012-06-10: qty 1

## 2012-06-10 MED ORDER — SODIUM CHLORIDE 0.9 % IJ SOLN: 3.0000 mL | INTRAMUSCULAR | Status: DC | PRN

## 2012-06-10 NOTE — Telephone Encounter (Signed)
noted 

## 2012-06-10 NOTE — ED Notes (Signed)
Pt voided urine in urinal

## 2012-06-10 NOTE — Progress Notes (Addendum)
ANTICOAGULATION CONSULT NOTE - Initial Consult  Pharmacy Consult for heparin Indication: rule out pulmonary embolus  No Known Allergies  Patient Measurements:   Heparin Dosing Weight: 92 kg  Vital Signs: Temp: 98.8 F (37.1 C) (02/11 1430) Temp src: Oral (02/11 1430) BP: 119/57 mmHg (02/11 1430) Pulse Rate: 94 (02/11 1430)  Labs:  Recent Labs  06/10/12 1116  HGB 13.6  HCT 39.7  PLT 208  CREATININE 0.89    The CrCl is unknown because both a height and weight (above a minimum accepted value) are required for this calculation.   Medical History: Past Medical History  Diagnosis Date  . Arthritis   . Murmur, cardiac   . Hypertension   . Colon polyps   . Hyperlipidemia     Medications:  Asa, hctz, lisinopril, mevacor, lopressor  Assessment: 79 year old man who presented with back pain not to rule out for pulmonary embolus.  He is not on any anticoagulants at home except for aspirin.  Goal of Therapy:  Heparin level 0.3-0.7 units/ml Monitor platelets by anticoagulation protocol: Yes   Plan:  Give 4000 units bolus x 1 Start heparin infusion at 1450 units/hr Check anti-Xa level in 6 hours and daily while on heparin Continue to monitor H&H and platelets  Mickeal Skinner 06/10/2012,3:50 PM  Addendum: Asked to dose vancomycin and Zosyn for suspected pneumonia as well.  Patient weighs 92.5KG and Scr is 0.9 with CrCl 60-70 mL/min.  First doses already ordered by ED physician. Plan: Vancomycin 1g IV q12, Zosyn 3.375g IV q8h (infuse over 4 hours)  Comment - could not find any risk factors for hospital acquired infection.  Consider narrowing antibiotics to Levofloxacin monotherapy or based on cultures. Celedonio Miyamoto, PharmD

## 2012-06-10 NOTE — ED Notes (Signed)
Family at bedside. 

## 2012-06-10 NOTE — ED Provider Notes (Signed)
History     CSN: [id number]  Arrival date & time 06/10/12  1044   First MD Initiated Contact with Patient 06/10/12 1101      Chief Complaint  Patient presents with  . Back Pain  . Abdominal Pain    HPI Patient is a 79 yo M with PMH of HTN, HLD, murmur and prior "small heart attack" in 2002 presenting from PCP office with complaint of right sided back, shoulder and abdominal pain. Pt states he has chronic low back pain, but current pain is different and started acutely at 7:00pm yesterday. It initially started as a pain under his right shoulder blade that did not improve with Vicodin or muscle relaxer. He is unable to lie flat, and pain continued this morning with pain to his right flank/abdomen therefore he went to see his PCP this morning. She was concerned of an intraabdominal process and sent him to ED for further evaluation.  He was noted to be in A.fib with RVR on arrival with no known history. He denies CP, SOB, palpitations at this time. He states his upper back is his biggest concern, and denies abdominal pain at this time as well.   Past Medical History  Diagnosis Date  . Arthritis   . Murmur, cardiac   . Hypertension   . Colon polyps   . Hyperlipidemia     Past Surgical History  Procedure Laterality Date  . Tonsillectomy      Family History  Problem Relation Age of Onset  . Arthritis Mother   . Heart disease Mother   . Arthritis Father   . Heart disease Father   . Hypertension Father   . Heart disease Paternal Grandfather     History  Substance Use Topics  . Smoking status: Former Smoker -- 1.00 packs/day for 10 years    Types: Cigarettes    Quit date: 01/31/1954  . Smokeless tobacco: Former Neurosurgeon    Types: Chew    Quit date: 02/01/1964  . Alcohol Use: No      Review of Systems  Allergies  Review of patient's allergies indicates no known allergies.  Home Medications   Current Outpatient Rx  Name  Route  Sig  Dispense  Refill  . aspirin 325 MG  tablet      1/2 tab daily         . diazepam (VALIUM) 5 MG tablet   Oral   Take 2.5-5 mg by mouth 3 (three) times daily as needed (for anxiety; 2.5 mg every morning and at noon and 5 mg at bedtime).          . hydrochlorothiazide (HYDRODIURIL) 25 MG tablet   Oral   Take 1 tablet (25 mg total) by mouth daily.   90 tablet   3   . HYDROcodone-acetaminophen (NORCO/VICODIN) 5-325 MG per tablet   Oral   Take 1 tablet by mouth 3 (three) times daily as needed for pain (for pain).         Marland Kitchen lisinopril (PRINIVIL,ZESTRIL) 10 MG tablet   Oral   Take 1 tablet (10 mg total) by mouth daily.   90 tablet   3   . metoprolol tartrate (LOPRESSOR) 25 MG tablet   Oral   Take 1 tablet (25 mg total) by mouth daily.   90 tablet   3   . traMADol (ULTRAM) 50 MG tablet   Oral   Take 50-100 mg by mouth every 6 (six) hours as needed for pain (for pain).         Marland Kitchen  lovastatin (MEVACOR) 20 MG tablet   Oral   Take 1 tablet (20 mg total) by mouth at bedtime.   90 tablet   3     BP 119/57  Pulse 94  Temp(Src) 98.8 F (37.1 C) (Oral)  Resp 20  SpO2 97%  Physical Exam  ED Course  Procedures (including critical care time)  Labs Reviewed  CBC WITH DIFFERENTIAL - Abnormal; Notable for the following:    WBC 19.1 (*)    Neutrophils Relative 82 (*)    Neutro Abs 15.7 (*)    Lymphocytes Relative 6 (*)    Monocytes Absolute 2.1 (*)    All other components within normal limits  COMPREHENSIVE METABOLIC PANEL - Abnormal; Notable for the following:    Chloride 94 (*)    Glucose, Bld 153 (*)    GFR calc non Af Amer 81 (*)    All other components within normal limits  URINALYSIS, ROUTINE W REFLEX MICROSCOPIC - Abnormal; Notable for the following:    Color, Urine Irwin Toran (*)    APPearance CLOUDY (*)    Glucose, UA 100 (*)    Hgb urine dipstick MODERATE (*)    Bilirubin Urine SMALL (*)    Ketones, ur 15 (*)    Protein, ur 30 (*)    Leukocytes, UA TRACE (*)    All other components within  normal limits  URINE MICROSCOPIC-ADD ON - Abnormal; Notable for the following:    Bacteria, UA FEW (*)    Casts GRANULAR CAST (*)    All other components within normal limits  POCT I-STAT TROPONIN I   Dg Chest Port 1 View  06/10/2012  *RADIOLOGY REPORT*  Clinical Data: Back pain.  PORTABLE CHEST - 1 VIEW  Comparison: None.  Findings: Low lung volumes with bibasilar atelectasis.  Heart size is accentuated by the low volumes.  No acute bony abnormality.  IMPRESSION: Low lung volumes with bibasilar opacities, likely atelectasis.   Original Report Authenticated By: Charlett Nose, M.D.     Date: 06/10/2012  Rate: 163  Rhythm: Atrial fib with RVR  QRS Axis: normal  Intervals: normal, except PR which cannot be calculated  ST/T Wave abnormalities: normal  Old EKG Reviewed: Now in A.fib which is new for patient   No diagnosis found.  MDM  79 yo M with back and abdominal pain found to be in A.fib with RVR  1110- Started on Cardiazem drip for A.fib with RVR with rate of 160-180. HR decreased after bolus and patient converted to NSR with HR in the 90's. Drip was d/c. Patient continued to have pain and was given Morphine. He still states this is the "worst pain he's ever had."  1217- Labs reviewed, WBC elevated. Will get abd u/s to rule out gallbladder pathology, but given the new onset A.fib and other risk factors patient should be admitted for observation. Cardiology consulted. Reviewed with Dr. Radford Pax.  1430- Urine shows leukocytes and HgB. Awaiting abd u/s. Patient's pain somewhat improved with Morphine but continues to stay 7/10. 1533- Abd u/s shows incidental finding of aortic aneurysm. Discussed patient with Dr. Gala Romney who would like to work up possible PE given the pain, elevated WBC and unilateral leg swelling on his exam. Will order CT angio and start heparin until results return.  1555- Care assumed by Dr. Zachary George, MD 06/10/12 1556

## 2012-06-10 NOTE — ED Provider Notes (Signed)
Care assumed at sign out. Jerry Fox is a 79 y.o. male here with new onset afib, SOB. Rapid afib converted to sinus after cardizem. Dr. Gala Romney saw the patient and recommended CT angio chest to r/o PE. CT showed saddle embolus and patient was started on heparin. He was never hypotensive so I called the ICU and they feel that he is stable for step down. He also has WBC 19 and elevated lactate so I ordered blood culture x 2 and vanc/zosyn for possible pneumonia. I talked to Dr. Susie Cassette, who accepted the patient on step down.   CRITICAL CARE Performed by: Silverio Lay, DAVID   Total critical care time: 40 min   Critical care time was exclusive of separately billable procedures and treating other patients.  Critical care was necessary to treat or prevent imminent or life-threatening deterioration.  Critical care was time spent personally by me on the following activities: development of treatment plan with patient and/or surrogate as well as nursing, discussions with consultants, evaluation of patient's response to treatment, examination of patient, obtaining history from patient or surrogate, ordering and performing treatments and interventions, ordering and review of laboratory studies, ordering and review of radiographic studies, pulse oximetry and re-evaluation of patient's condition.    [name], MD 06/10/12 [phone]

## 2012-06-10 NOTE — Telephone Encounter (Signed)
Patient Information:  Caller Name: Kennon Rounds  Phone: [phone]  Patient: Jerry Fox, Jerry Fox  Gender: Male  DOB: May 26, 1933  Age: 79 Years  PCP: Evelena Peat (Family Practice)  Office Follow Up:  Does the office need to follow up with this patient?: No  Instructions For The Office: N/A  RN Note:  ONset of pain down right side of back from shoulders down to end of spine above the buttocks.  Pain does radiate around the side to the stomach area on the right.  Per back pain protocol, advised appt today; appt scheduled 1000 06/10/12 with Dr. Selena Batten.  krs/can  Symptoms  Reason For Call & Symptoms: disabling back pain  Reviewed Health History In EMR: Yes  Reviewed Medications In EMR: Yes  Reviewed Allergies In EMR: Yes  Reviewed Surgeries / Procedures: Yes  Date of Onset of Symptoms: 06/09/2012  Guideline(s) Used:  Back Pain  Disposition Per Guideline:   Go to Office Now  Reason For Disposition Reached:   Severe back pain  Advice Given:  N/A  Appointment Scheduled:  06/10/2012 10:00:00 Appointment Scheduled Provider:  Kriste Basque (Family Practice)

## 2012-06-10 NOTE — ED Notes (Signed)
Cardiologist in to assess pt for admission 

## 2012-06-10 NOTE — ED Notes (Signed)
Patient transported to Ultrasound 

## 2012-06-10 NOTE — Progress Notes (Addendum)
Chief Complaint  Patient presents with  . severe back pain    HPI:  Acute visit for back pain and abd pain: -started last night and reports it is the worst pain he has ever had in his life and is not like any of the back pain he has had in the past -last night started having worsening back pain - did lift a mattress the day before and felt back strain with this -pain is severe and constant in R abdomen, R low back and R shoulder -has taken his vicodin and ibuprofen and dermatran which helped minimally -pain is worse with any movement and was worse when he woke up, sleep was uncomfortable -denies: fevers, chills, SOB, NVD, palpitations, dysuria, changes in bowel or vomiting  ROS: See pertinent positives and negatives per HPI.  Past Medical History  Diagnosis Date  . Arthritis   . Murmur, cardiac   . Hypertension   . Colon polyps   . Hyperlipidemia     Family History  Problem Relation Age of Onset  . Arthritis Mother   . Heart disease Mother   . Arthritis Father   . Heart disease Father   . Hypertension Father   . Heart disease Paternal Grandfather     History   Social History  . Marital Status: Married    Spouse Name: N/A    Number of Children: N/A  . Years of Education: N/A   Social History Main Topics  . Smoking status: Former Smoker -- 1.00 packs/day for 10 years    Types: Cigarettes    Quit date: 01/31/1954  . Smokeless tobacco: Former Neurosurgeon    Types: Chew    Quit date: 02/01/1964  . Alcohol Use: None  . Drug Use: None  . Sexually Active: None   Other Topics Concern  . None   Social History Narrative  . None    Current outpatient prescriptions:aspirin 325 MG tablet, 1/2 tab daily, Disp: , Rfl: ;  diazepam (VALIUM) 5 MG tablet, Take 5 mg by mouth every 12 (twelve) hours as needed., Disp: , Rfl: ;  hydrochlorothiazide (HYDRODIURIL) 25 MG tablet, Take 1 tablet (25 mg total) by mouth daily., Disp: 90 tablet, Rfl: 3;  HYDROcodone-acetaminophen  (NORCO/VICODIN) 5-325 MG per tablet, Take 1 tablet by mouth 3 (three) times daily as needed., Disp: 90 tablet, Rfl: 3 lisinopril (PRINIVIL,ZESTRIL) 10 MG tablet, Take 1 tablet (10 mg total) by mouth daily., Disp: 90 tablet, Rfl: 3;  lovastatin (MEVACOR) 20 MG tablet, Take 1 tablet (20 mg total) by mouth at bedtime., Disp: 90 tablet, Rfl: 3;  metoprolol tartrate (LOPRESSOR) 25 MG tablet, Take 1 tablet (25 mg total) by mouth daily., Disp: 90 tablet, Rfl: 3;  traMADol (ULTRAM) 50 MG tablet, 1-2 po q 6 hours prn pain, Disp: 30 tablet, Rfl: 0  EXAM:  Filed Vitals:   06/10/12 0947  BP: 138/78  Pulse: 107  Temp: 98.6 F (37 C)    Body mass index is 30.35 kg/(m^2).  GENERAL: vitals reviewed and listed above, alert, oriented, diaphoretic and appears to be in obvious discomfort  HEENT: atraumatic, conjunttiva clear, no obvious abnormalities on inspection of external nose and ears  NECK: no obvious masses on inspection  LUNGS: clear to auscultation bilaterally, no wheezes, rales or rhonchi, good air movement  CV: HRRR, no peripheral edema  ABD: BS+, TTP R upper, mid and RLQ with guarding - could not tolerate deep palpation on the R  MS: moves all extremities without noticeable abnormality -  minimal TTP R upper shoulder and mid thoracic paraspinal muscles  PSYCH: pleasant and cooperative, no obvious depression or anxiety  ASSESSMENT AND PLAN:  Discussed the following assessment and plan:  1. Abdominal pain, right upper quadrant   2. Back pain    -patient with known chronic back pain, but with most of his discomfort on exam today in the abdomen with obvious discomfort with even mild TTP in the RLQ and RUQ, vital sings ok except for mild tachy -advised EMS transport to the ED for further evaluation for intraabdominal pathology  -pt refused EMS transport - he and wife report they will drive to the ED - he reports he will be more comfortable and it with be faster then the ambulance and  refuses EMS -ED notified and notified traige nurse of my concerns for possible intraabdominal acute pathology and their refusal to take EMS -Patient advised to return or notify a doctor immediately if symptoms worsen or persist or new concerns arise.  There are no Patient Instructions on file for this visit.   Kriste Basque R.

## 2012-06-10 NOTE — Consult Note (Signed)
PCP: Evelena Peat, MD Reason for consult: Back pain. New onset AF.  HPI: Mr. Kinzler is a 79 y/o male with h/o CAD, HTN, HL and chronic back pain. Whom we are asked to consult on for back pain and new onset AF.   Says he had small MI in 2002. Cath at [facility] showed a blockage "in a very small artery at the bottom which already fixed itself." Other arteries said to be OK. Has not had cardiology f/u since.  His wife says he has not felt well for the past week or so. Last night about 7PM had severe pain in R scapula that radiated down his side and into his hip. This was very severe and much different than his chronic back pain. It was associated with shortness of breath. No CP, fever, chills, cough, nausea or vomiting.  Came to the ER this am due to ongoing severe pain in his back. Found to be in AF @163 . ECG with LVH but no ST-T wave abnormality. Converted to NSR spontaneously. Troponin negative. WBC 19k. Ab u/s showed small AAA but no dissection. No cholecystitis.   On ROS, no orthopnea or PND. + LLE swelling.   Review of Systems:     Cardiac Review of Systems: {Y] = yes [ ]  = no  Chest Pain [    ]  Resting SOB Cove.Etienne  ] Exertional SOB  [  ]  Orthopnea [  ]   Pedal Edema [   y]    Palpitations [ y ] Syncope  [  ]   Presyncope [   ]  General Review of Systems: [Y] = yes [  ]=no Constitional: recent weight change [  ]; anorexia [  ]; fatigue [  ]; nausea [  ]; night sweats [  ]; fever [  ]; or chills [  ];                                                                                                                                          Dental: poor dentition[  ];   Eye : blurred vision [  ]; diplopia [   ]; vision changes [  ];  Amaurosis fugax[  ]; Resp: cough [  ];  wheezing[  ];  hemoptysis[  ]; shortness of breath[y  ]; paroxysmal nocturnal dyspnea[  ]; dyspnea on exertion[  ]; or orthopnea[  ];  GI:  gallstones[  ], vomiting[  ];  dysphagia[  ]; melena[  ];  hematochezia [  ];  heartburn[  ];  GU: kidney stones [  ]; hematuria[  ];   dysuria [  ];  nocturia[  ];  history of     obstruction [  ];                 Skin: rash, swelling[  ];, hair loss[  ];  peripheral  edema[ y ];  or itching[  ]; Musculosketetal: myalgias[  ];  joint swelling[  ];  joint erythema[  ];  joint pain[y  ];  back pain[y  ];  Heme/Lymph: bruising[  ];  bleeding[  ];  anemia[  ];  Neuro: TIA[  ];  headaches[  ];  stroke[  ];  vertigo[  ];  seizures[  ];   paresthesias[  ];  difficulty walking[  ];  Psych:depression[  ]; anxiety[  ];  Endocrine: diabetes[  ];  thyroid dysfunction[  ];  Other:  Past Medical History  Diagnosis Date  . Arthritis   . Murmur, cardiac   . Hypertension   . Colon polyps   . Hyperlipidemia     No Known Allergies  History   Social History  . Marital Status: Married    Spouse Name: N/A    Number of Children: N/A  . Years of Education: N/A   Occupational History  . Not on file.   Social History Main Topics  . Smoking status: Former Smoker -- 1.00 packs/day for 10 years    Types: Cigarettes    Quit date: 01/31/1954  . Smokeless tobacco: Former Neurosurgeon    Types: Chew    Quit date: 02/01/1964  . Alcohol Use: No  . Drug Use: Not on file  . Sexually Active: Not on file   Other Topics Concern  . Not on file   Social History Narrative  . No narrative on file    Family History  Problem Relation Age of Onset  . Arthritis Mother   . Heart disease Mother   . Arthritis Father   . Heart disease Father   . Hypertension Father   . Heart disease Paternal Grandfather     PHYSICAL EXAM: Filed Vitals:   06/10/12 1430  BP: 119/57  Pulse: 94  Temp: 98.8 F (37.1 C)  Resp: 20   General:  Mildly tachpyneic HEENT: normal Neck: supple. JVP 9 cm + prominent CV waves. Carotids 2+ bilat; no bruits. No lymphadenopathy or thryomegaly appreciated. Cor: PMI nondisplaced. Regular rate & rhythm. No rubs, gallops 2/6 TR Lungs: clear Abdomen: obese soft,  nontender, nondistended. No hepatosplenomegaly. No bruits or masses. Good bowel sounds. Extremities: no cyanosis, clubbing, rash, LLE 1+ edema. slightly warm. R is normal. No cords Neuro: alert & oriented x 3, cranial nerves grossly intact. moves all 4 extremities w/o difficulty. Affect pleasant.  ECG: AF 163 LVH. No ST-T wave abnormalities.    Results for orders placed during the hospital encounter of 06/10/12 (from the past 24 hour(s))  CBC WITH DIFFERENTIAL     Status: Abnormal   Collection Time    06/10/12 11:16 AM      Result Value Range   WBC 19.1 (*) 4.0 - 10.5 K/uL   RBC 4.31  4.22 - 5.81 MIL/uL   Hemoglobin 13.6  13.0 - 17.0 g/dL   HCT 16.1  09.6 - 04.5 %   MCV 92.1  78.0 - 100.0 fL   MCH 31.6  26.0 - 34.0 pg   MCHC 34.3  30.0 - 36.0 g/dL   RDW 40.9  81.1 - 91.4 %   Platelets 208  150 - 400 K/uL   Neutrophils Relative 82 (*) 43 - 77 %   Neutro Abs 15.7 (*) 1.7 - 7.7 K/uL   Lymphocytes Relative 6 (*) 12 - 46 %   Lymphs Abs 1.2  0.7 - 4.0 K/uL   Monocytes Relative 11  3 - 12 %  Monocytes Absolute 2.1 (*) 0.1 - 1.0 K/uL   Eosinophils Relative 0  0 - 5 %   Eosinophils Absolute 0.0  0.0 - 0.7 K/uL   Basophils Relative 0  0 - 1 %   Basophils Absolute 0.0  0.0 - 0.1 K/uL  COMPREHENSIVE METABOLIC PANEL     Status: Abnormal   Collection Time    06/10/12 11:16 AM      Result Value Range   Sodium 135  135 - 145 mEq/L   Potassium 3.5  3.5 - 5.1 mEq/L   Chloride 94 (*) 96 - 112 mEq/L   CO2 30  19 - 32 mEq/L   Glucose, Bld 153 (*) 70 - 99 mg/dL   BUN 19  6 - 23 mg/dL   Creatinine, Ser 1.61  0.50 - 1.35 mg/dL   Calcium 9.4  8.4 - 09.6 mg/dL   Total Protein 7.6  6.0 - 8.3 g/dL   Albumin 3.7  3.5 - 5.2 g/dL   AST 17  0 - 37 U/L   ALT 15  0 - 53 U/L   Alkaline Phosphatase 60  39 - 117 U/L   Total Bilirubin 0.9  0.3 - 1.2 mg/dL   GFR calc non Af Amer 81 (*) >90 mL/min   GFR calc Af Amer >90  >90 mL/min  POCT I-STAT TROPONIN I     Status: None   Collection Time    06/10/12  11:19 AM      Result Value Range   Troponin i, poc 0.00  0.00 - 0.08 ng/mL   Comment 3           URINALYSIS, ROUTINE W REFLEX MICROSCOPIC     Status: Abnormal   Collection Time    06/10/12  1:02 PM      Result Value Range   Color, Urine AMBER (*) YELLOW   APPearance CLOUDY (*) CLEAR   Specific Gravity, Urine 1.026  1.005 - 1.030   pH 6.0  5.0 - 8.0   Glucose, UA 100 (*) NEGATIVE mg/dL   Hgb urine dipstick MODERATE (*) NEGATIVE   Bilirubin Urine SMALL (*) NEGATIVE   Ketones, ur 15 (*) NEGATIVE mg/dL   Protein, ur 30 (*) NEGATIVE mg/dL   Urobilinogen, UA 0.2  0.0 - 1.0 mg/dL   Nitrite NEGATIVE  NEGATIVE   Leukocytes, UA TRACE (*) NEGATIVE  URINE MICROSCOPIC-ADD ON     Status: Abnormal   Collection Time    06/10/12  1:02 PM      Result Value Range   Squamous Epithelial / LPF RARE  RARE   WBC, UA 0-2  <3 WBC/hpf   RBC / HPF 3-6  <3 RBC/hpf   Bacteria, UA FEW (*) RARE   Casts GRANULAR CAST (*) NEGATIVE   Urine-Other MUCOUS PRESENT     US Abdomen Complete  06/10/2012  *RADIOLOGY REPORT*  Clinical Data:  Abdominal pain, back pain  COMPLETE ABDOMINAL ULTRASOUND  Comparison:  Lumbar MRI 08/03/2008.  Findings: Study is limited by abundant bowel gas and patient's large body habitus.  No sonographic Murphy's sign.  Gallbladder:  No gallstones, gallbladder wall thickening, or pericholecystic fluid.  Common bile duct:  Measures 4.8 mm in diameter.  Liver:  No focal lesion identified.  Within normal limits in parenchymal echogenicity.  IVC: Not visualized due to abundant bowel gas  Pancreas:  Not visualized  Spleen:  Measures 5.7 cm in length.  Normal echogenicity.  Right Kidney:  Measures 12.4 cm in length.  No  mass, hydronephrosis or diagnostic renal calculus  Left Kidney:  Measures 12.3 cm in length.  No mass, hydronephrosis or diagnostic renal calculus.  Abdominal aorta: There is aneurysm of the abdominal aorta measures 4.3 x 3.4 cm extending 3.8 cm cranial caudally.  IMPRESSION:  1.  No  gallstones are noted within gallbladder.  Normal CBD. 2.  No hydronephrosis or diagnostic renal calculus. 3.  Aneurysm of the abdominal aorta measures 4.3 x 3.4 cm.   Original Report Authenticated By: Natasha Mead, M.D.    Dg Chest Port 1 View  06/10/2012  *RADIOLOGY REPORT*  Clinical Data: Back pain.  PORTABLE CHEST - 1 VIEW  Comparison: None.  Findings: Low lung volumes with bibasilar atelectasis.  Heart size is accentuated by the low volumes.  No acute bony abnormality.  IMPRESSION: Low lung volumes with bibasilar opacities, likely atelectasis.   Original Report Authenticated By: Charlett Nose, M.D.      ASSESSMENT:  1. Back pain, acute severe 2. AF with RVR, new onset - now back in sinus rhythm 3. Asymmetric LE swelling 4. H/o CAD with NSTEMI on cath 2002 as above 5. Small AAA on u/s 6. HTN, controlled  PLAN/DISCUSSION:  Given his acute back pain, asymmetric LE edema and new onset AF my major concern is possible PE. Will get chest CT to further evaluate. If CT negative, would admit to hospitalist service for further work-up. We will continue to follow. Would start heparin now. Order echo. If does not have PE will need to decide on need for further cardiac testing and need for chronic anti-coagulation depending on what work-up shows.   D/w Dr. Radford Pax in ER.   Mckenna Gamm,MD 3:52 PM

## 2012-06-10 NOTE — ED Notes (Addendum)
Pt presents to department for evaluation of back pain and R sided abdominal pain. Pt from [facility] PCP for further evaluation. Onset of symptoms Monday evening. 10/10 pain at the time, becomes worse with movement. Pt is alert and oriented x4. SVT on cardiac monitor upon arrival to ED.

## 2012-06-10 NOTE — H&P (Addendum)
[facility] History and Physical  ELVIS LAUFER WUJ:[id number] DOB: [birth year] DOA: 06/10/2012  Referring physician: [name], MD  PCP: [name], MD   Chief Complaint: *Back pain. New onset AF  HPI:  Mr. Jerry Fox is a 79 y/o male with h/o CAD, HTN, HL and chronic back pain who presented with   new onset afib, SOB. Rapid afib converted to sinus rhythm after iv cardizem. Dr. Gala Romney saw the patient and recommended CT angio chest to r/o PE. CT showed saddle embolus and patient was started on heparin. He was never hypotensive Says he had small MI in 2002. Cath at [facility] showed a blockage "in a very small artery at the bottom which already fixed itself." Other arteries said to be OK. Has not had cardiology f/u since.  His wife says he has not felt well for the past week or so. Last night about 7PM had severe pain in R scapula that radiated down his side and into his hip. This was very severe and much different than his chronic back pain. It was associated with shortness of breath. No CP, fever, chills, cough, nausea or vomiting.  Came to the ER this am due to ongoing severe pain in his back. Found to be in AF @163 . ECG with LVH but no ST-T wave abnormality. Converted to NSR spontaneously after iv cardizem . Troponin negative. He also has WBC 19 and elevated lactate so I ordered blood culture x 2 and vanc/zosyn for possible pneumonia.        Review of Systems: negative for the following  Constitutional: Denies fever, chills, diaphoresis, appetite change and fatigue.  HEENT: Denies photophobia, eye pain, redness, hearing loss, ear pain, congestion, sore throat, rhinorrhea, sneezing, mouth sores, trouble swallowing, neck pain, neck stiffness and tinnitus.  Respiratory: Denies SOB, DOE, cough, chest tightness, and wheezing.  Cardiovascular: Denies chest pain, palpitations and leg swelling.  Gastrointestinal: Denies nausea, vomiting, abdominal pain, diarrhea, constipation,  blood in stool and abdominal distention.  Genitourinary: Denies dysuria, urgency, frequency, hematuria, flank pain and difficulty urinating.  Musculoskeletal: Denies myalgias, back pain, joint swelling, arthralgias and gait problem.  Skin: Denies pallor, rash and wound.  Neurological: Denies dizziness, seizures, syncope, weakness, light-headedness, numbness and headaches.  Hematological: Denies adenopathy. Easy bruising, personal or family bleeding history  Psychiatric/Behavioral: Denies suicidal ideation, mood changes, confusion, nervousness, sleep disturbance and agitation       Past Medical History  Diagnosis Date  . Arthritis   . Murmur, cardiac   . Hypertension   . Colon polyps   . Hyperlipidemia      Past Surgical History  Procedure Laterality Date  . Tonsillectomy        Social History:  reports that he quit smoking about 58 years ago. His smoking use included Cigarettes. He has a 10 pack-year smoking history. He quit smokeless tobacco use about 48 years ago. His smokeless tobacco use included Chew. He reports that he does not drink alcohol. His drug history is not on file.    No Known Allergies  Family History  Problem Relation Age of Onset  . Arthritis Mother   . Heart disease Mother   . Arthritis Father   . Heart disease Father   . Hypertension Father   . Heart disease Paternal Grandfather      Prior to Admission medications   Medication Sig Start Date End Date Taking? Authorizing Provider  aspirin 325 MG tablet 1/2 tab daily   Yes Historical Provider, MD  diazepam (  VALIUM) 5 MG tablet Take 2.5-5 mg by mouth 3 (three) times daily as needed (for anxiety; 2.5 mg every morning and at noon and 5 mg at bedtime).  04/02/12  Yes [name], MD  hydrochlorothiazide (HYDRODIURIL) 25 MG tablet Take 1 tablet (25 mg total) by mouth daily. 01/07/12  Yes [name], MD  HYDROcodone-acetaminophen (NORCO/VICODIN) 5-325 MG per tablet Take 1 tablet by mouth 3  (three) times daily as needed for pain (for pain).   Yes Historical Provider, MD  lisinopril (PRINIVIL,ZESTRIL) 10 MG tablet Take 1 tablet (10 mg total) by mouth daily. 01/07/12 01/06/13 Yes [name], MD  metoprolol tartrate (LOPRESSOR) 25 MG tablet Take 1 tablet (25 mg total) by mouth daily. 01/07/12  Yes [name], MD  traMADol (ULTRAM) 50 MG tablet Take 50-100 mg by mouth every 6 (six) hours as needed for pain (for pain).   Yes Historical Provider, MD  lovastatin (MEVACOR) 20 MG tablet Take 1 tablet (20 mg total) by mouth at bedtime. 01/07/12   [name], MD     Physical Exam: Filed Vitals:   06/10/12 1055 06/10/12 1200 06/10/12 1430 06/10/12 1615  BP: 128/78  119/57 121/64  Pulse: 172 96 94 116  Temp: 97.9 F (36.6 C)  98.8 F (37.1 C) 98.6 F (37 C)  TempSrc: Oral  Oral Oral  Resp: 18 21 20 17   SpO2: 98% 95% 97% 95%     Constitutional: Vital signs reviewed. Patient is a well-developed and well-nourished in no acute distress and cooperative with exam. Alert and oriented x3.  Head: Normocephalic and atraumatic  Ear: TM normal bilaterally  Mouth: no erythema or exudates, MMM  Eyes: PERRL, EOMI, conjunctivae normal, No scleral icterus.  Neck: supple. JVP 9 cm + prominent CV waves. Carotids 2+ bilat; no bruits. No lymphadenopathy or thryomegaly appreciated.  Cor: PMI nondisplaced. Regular rate & rhythm. No rubs, gallops 2/6 TR Pulmonary/Chest: CTAB, no wheezes, rales, or rhonchi  Abdominal: Soft. Non-tender, non-distended, bowel sounds are normal, no masses, organomegaly, or guarding present.  GU: no CVA tenderness Musculoskeletal: No joint deformities, erythema, or stiffness, ROM full and no nontender Ext: no edema and no cyanosis, pulses palpable bilaterally (DP and PT)  Hematology: no cervical, inginal, or axillary adenopathy.  Neurological: A&O x3, Strenght is normal and symmetric bilaterally, cranial nerve II-XII are grossly intact, no focal motor deficit,  sensory intact to light touch bilaterally.  Skin: Warm, dry and intact. No rash, cyanosis, or clubbing.  Psychiatric: Normal mood and affect. speech and behavior is normal. Judgment and thought content normal. Cognition and memory are normal.       Labs on Admission:    Basic Metabolic Panel:  Recent Labs Lab 06/10/12 1116  NA 135  K 3.5  CL 94*  CO2 30  GLUCOSE 153*  BUN 19  CREATININE 0.89  CALCIUM 9.4   Liver Function Tests:  Recent Labs Lab 06/10/12 1116  AST 17  ALT 15  ALKPHOS 60  BILITOT 0.9  PROT 7.6  ALBUMIN 3.7   No results found for this basename: LIPASE, AMYLASE,  in the last 168 hours No results found for this basename: AMMONIA,  in the last 168 hours CBC:  Recent Labs Lab 06/10/12 1116  WBC 19.1*  NEUTROABS 15.7*  HGB 13.6  HCT 39.7  MCV 92.1  PLT 208   Cardiac Enzymes:  Recent Labs Lab 06/10/12 1555  TROPONINI <0.30    BNP (last 3 results) No results found  for this basename: PROBNP,  in the last 8760 hours    CBG: No results found for this basename: GLUCAP,  in the last 168 hours  Radiological Exams on Admission: Ct Angio Chest W/cm &/or Wo Cm  06/10/2012  *RADIOLOGY REPORT*  Clinical Data:  Right shoulder pain, shortness of breath, flank pain.  CT ANGIOGRAPHY CHEST CT ABDOMEN AND PELVIS WITH CONTRAST  Technique:  Multidetector CT imaging of the chest was performed using the standard protocol during bolus administration of intravenous contrast.  Multiplanar CT image reconstructions including MIPs were obtained to evaluate the vascular anatomy. Multidetector CT imaging of the abdomen and pelvis was performed using the standard protocol during bolus administration of intravenous contrast.  Contrast: 80mL OMNIPAQUE IOHEXOL 350 MG/ML SOLN  Comparison:   None.  CTA CHEST  Findings:  There is a saddle embolus across the bifurcation of the main pulmonary artery, nearly occlusive at it extent into bilateral lower lobe branches. Adequate  contrast opacification of the thoracic aorta with no evidence of dissection, aneurysm, or stenosis. There is classic 3-vessel brachiocephalic arch anatomy. Patchy coronary and aortic calcifications.  No pleural or pericardial effusion.  Sub centimeter precarinal and subcarinal lymph nodes.  No definite hilar adenopathy.  Extensive ground-glass opacities throughout much of the right lower lobe with some patchy interspersed airspace disease.  There is mild dependent atelectasis posteriorly in the left lower lobe.  Linear scarring or subsegmental atelectasis in the lingula and right middle lobe. Regional bones unremarkable.   Review of the MIP images confirms the above findings.  IMPRESSION:  1.  Saddle pulmonary embolus with occlusive components extending into bilateral lower lobe branches. 2.  Alveolar and airspace opacities throughout   much of the right lower lobe, differential diagnosis pulmonary infarct, pneumonia, other inflammatory or infiltrative processes.  I telephoned the critical test results to Dr. Silverio Lay  at the time of interpretation.  CT ABDOMEN AND PELVIS  Findings: Scattered atheromatous plaque in the abdominal aorta with a fusiform 3.8 cm infrarenal aneurysm.  No dissection or stenosis. No retroperitoneal hemorrhage.  Small hiatal hernia.  Stomach, small bowel, and colon are nondilated.  No ascites.  No free air.  Unremarkable liver, gallbladder, spleen, adrenal glands, kidneys.  Portal vein patent. Spondylitic changes in the lower lumbar spine. Urinary bladder physiologically distended.  Right inguinal hernia containing only mesenteric fat.  No adenopathy localized.  Review of the MIP images confirms the above findings.  IMPRESSION:  1.  No acute abdominal process. 2.  3.8 cm abdominal aortic aneurysm. Recommend followup by [place] in 2 years.  This recommendation follows ACR consensus guidelines: White Paper of the ACR Incidental Findings Committee II on Vascular Findings.  J Am Coll Radiol 2013;  10:789-794. 3.  Small hiatal hernia.   Original Report Authenticated By: D. Andria Rhein, MD    US Abdomen Complete  06/10/2012  *RADIOLOGY REPORT*  Clinical Data:  Abdominal pain, back pain  COMPLETE ABDOMINAL ULTRASOUND  Comparison:  Lumbar MRI 08/03/2008.  Findings: Study is limited by abundant bowel gas and patient's large body habitus.  No sonographic Murphy's sign.  Gallbladder:  No gallstones, gallbladder wall thickening, or pericholecystic fluid.  Common bile duct:  Measures 4.8 mm in diameter.  Liver:  No focal lesion identified.  Within normal limits in parenchymal echogenicity.  IVC: Not visualized due to abundant bowel gas  Pancreas:  Not visualized  Spleen:  Measures 5.7 cm in length.  Normal echogenicity.  Right Kidney:  Measures 12.4 cm in length.  No mass, hydronephrosis or diagnostic renal calculus  Left Kidney:  Measures 12.3 cm in length.  No mass, hydronephrosis or diagnostic renal calculus.  Abdominal aorta: There is aneurysm of the abdominal aorta measures 4.3 x 3.4 cm extending 3.8 cm cranial caudally.  IMPRESSION:  1.  No gallstones are noted within gallbladder.  Normal CBD. 2.  No hydronephrosis or diagnostic renal calculus. 3.  Aneurysm of the abdominal aorta measures 4.3 x 3.4 cm.   Original Report Authenticated By: Natasha Mead, M.D.    Ct Abdomen Pelvis W Contrast  06/10/2012  *RADIOLOGY REPORT*  Clinical Data:  Right shoulder pain, shortness of breath, flank pain.  CT ANGIOGRAPHY CHEST CT ABDOMEN AND PELVIS WITH CONTRAST  Technique:  Multidetector CT imaging of the chest was performed using the standard protocol during bolus administration of intravenous contrast.  Multiplanar CT image reconstructions including MIPs were obtained to evaluate the vascular anatomy. Multidetector CT imaging of the abdomen and pelvis was performed using the standard protocol during bolus administration of intravenous contrast.  Contrast: 80mL OMNIPAQUE IOHEXOL 350 MG/ML SOLN  Comparison:   None.  CTA  CHEST  Findings:  There is a saddle embolus across the bifurcation of the main pulmonary artery, nearly occlusive at it extent into bilateral lower lobe branches. Adequate contrast opacification of the thoracic aorta with no evidence of dissection, aneurysm, or stenosis. There is classic 3-vessel brachiocephalic arch anatomy. Patchy coronary and aortic calcifications.  No pleural or pericardial effusion.  Sub centimeter precarinal and subcarinal lymph nodes.  No definite hilar adenopathy.  Extensive ground-glass opacities throughout much of the right lower lobe with some patchy interspersed airspace disease.  There is mild dependent atelectasis posteriorly in the left lower lobe.  Linear scarring or subsegmental atelectasis in the lingula and right middle lobe. Regional bones unremarkable.   Review of the MIP images confirms the above findings.  IMPRESSION:  1.  Saddle pulmonary embolus with occlusive components extending into bilateral lower lobe branches. 2.  Alveolar and airspace opacities throughout   much of the right lower lobe, differential diagnosis pulmonary infarct, pneumonia, other inflammatory or infiltrative processes.  I telephoned the critical test results to Dr. Silverio Lay  at the time of interpretation.  CT ABDOMEN AND PELVIS  Findings: Scattered atheromatous plaque in the abdominal aorta with a fusiform 3.8 cm infrarenal aneurysm.  No dissection or stenosis. No retroperitoneal hemorrhage.  Small hiatal hernia.  Stomach, small bowel, and colon are nondilated.  No ascites.  No free air.  Unremarkable liver, gallbladder, spleen, adrenal glands, kidneys.  Portal vein patent. Spondylitic changes in the lower lumbar spine. Urinary bladder physiologically distended.  Right inguinal hernia containing only mesenteric fat.  No adenopathy localized.  Review of the MIP images confirms the above findings.  IMPRESSION:  1.  No acute abdominal process. 2.  3.8 cm abdominal aortic aneurysm. Recommend followup by [place] in 2  years.  This recommendation follows ACR consensus guidelines: White Paper of the ACR Incidental Findings Committee II on Vascular Findings.  J Am Coll Radiol 2013; 10:789-794. 3.  Small hiatal hernia.   Original Report Authenticated By: D. Andria Rhein, MD    Dg Chest Port 1 View  06/10/2012  *RADIOLOGY REPORT*  Clinical Data: Back pain.  PORTABLE CHEST - 1 VIEW  Comparison: None.  Findings: Low lung volumes with bibasilar atelectasis.  Heart size is accentuated by the low volumes.  No acute bony abnormality.  IMPRESSION: Low lung volumes with bibasilar opacities, likely atelectasis.   Original  Report Authenticated By: Charlett Nose, M.D.     EKG: Independently reviewed.Date: 06/10/2012  Rate: 163  Rhythm: Atrial fib with RVR  QRS Axis: normal  Intervals: normal, except PR which cannot be calculated  ST/T Wave abnormalities: normal  Old EKG Reviewed: Now in A.fib which is new for patie   Assessment/Plan Principal Problem:   Saddle pulmonary embolus Active Problems:   Hyperlipidemia   Hypertension   Chronic low back pain   Atrial fibrillation   1. Saddle pulmonary embolism started on heparin drip, 2-D echo completed, we'll admit to step down, cycle cardiac enzymes, blood pressure stable at this time 2. It'll fibrillation with rapid ventricular response presenting heart rate was 160-180. The patient was started on a Cardizem drip and he converted to normal sinus rhythm after diltiazem bolus. Patient would need long-term anticoagulation, cardiology following, Continue toprol XL if ok with cards  3. Possible pulmonary infarct  As the cause of his leukocytosis. Hold off on ABX at this time per cardiology . 4. Incidental abdominal aortic aneurysm, cardiology of a, patient will need repeat ultrasound per protocol.  Code Status:   full Family Communication: bedside Disposition Plan: admit   Time spent: 70 mins   [facility] Pager [phone]  If 7PM-7AM, please contact  night-coverage [email or web address] Password [facility] 06/10/2012, 6:17 PM

## 2012-06-10 NOTE — Progress Notes (Signed)
  Echocardiogram 2D Echocardiogram has been performed.  Georgian Co 06/10/2012, 6:02 PM

## 2012-06-10 NOTE — ED Notes (Signed)
Echo at bedside, pt c/o chest pain rate of 8 on pain scale 0-10.  Pain med given for same.

## 2012-06-10 NOTE — ED Notes (Signed)
IV attempted x's 2 in left arm without success.

## 2012-06-11 ENCOUNTER — Inpatient Hospital Stay (HOSPITAL_COMMUNITY): Payer: Medicare Other

## 2012-06-11 DIAGNOSIS — D72829 Elevated white blood cell count, unspecified: Secondary | ICD-10-CM | POA: Diagnosis present

## 2012-06-11 DIAGNOSIS — I714 Abdominal aortic aneurysm, without rupture: Secondary | ICD-10-CM

## 2012-06-11 DIAGNOSIS — J9600 Acute respiratory failure, unspecified whether with hypoxia or hypercapnia: Secondary | ICD-10-CM

## 2012-06-11 DIAGNOSIS — I272 Pulmonary hypertension, unspecified: Secondary | ICD-10-CM | POA: Diagnosis present

## 2012-06-11 DIAGNOSIS — J9601 Acute respiratory failure with hypoxia: Secondary | ICD-10-CM | POA: Diagnosis present

## 2012-06-11 DIAGNOSIS — M7989 Other specified soft tissue disorders: Secondary | ICD-10-CM

## 2012-06-11 DIAGNOSIS — R0781 Pleurodynia: Secondary | ICD-10-CM | POA: Diagnosis present

## 2012-06-11 LAB — COMPREHENSIVE METABOLIC PANEL
ALT: 11 U/L (ref 0–53)
Albumin: 3.1 g/dL — ABNORMAL LOW (ref 3.5–5.2)
Calcium: 8.7 mg/dL (ref 8.4–10.5)
GFR calc Af Amer: >90 mL/min (ref >90)
Glucose, Bld: 117 mg/dL — ABNORMAL HIGH (ref 70–99)
Sodium: 138 mEq/L (ref 135–145)
Total Protein: 6.9 g/dL (ref 6.0–8.3)

## 2012-06-11 LAB — TROPONIN I
Troponin I: <0.3 ng/mL (ref <0.30)
Troponin I: <0.3 ng/mL (ref <0.30)

## 2012-06-11 LAB — URINE CULTURE: Colony Count: NO GROWTH

## 2012-06-11 LAB — LACTIC ACID, PLASMA: Lactic Acid, Venous: 0.9 mmol/L (ref 0.5–2.2)

## 2012-06-11 LAB — CBC
Hemoglobin: 12.1 g/dL — ABNORMAL LOW (ref 13.0–17.0)
MCH: 31.5 pg (ref 26.0–34.0)
MCHC: 33.5 g/dL (ref 30.0–36.0)
Platelets: 200 10*3/uL (ref 150–400)
RDW: 13.1 % (ref 11.5–15.5)

## 2012-06-11 LAB — HEMOGLOBIN A1C
Hgb A1c MFr Bld: 5.2 % (ref <5.7)
Mean Plasma Glucose: 103 mg/dL (ref <117)

## 2012-06-11 MED ORDER — SODIUM CHLORIDE 0.9 % IV SOLN
INTRAVENOUS | Status: DC
  Administered 2012-06-12: 05:00:00 via INTRAVENOUS

## 2012-06-11 MED ORDER — OXYCODONE HCL 5 MG PO TABS
5.0000 mg | ORAL_TABLET | ORAL | Status: DC | PRN
  Administered 2012-06-11 (×5): 10 mg via ORAL
  Administered 2012-06-12: 5 mg via ORAL
  Administered 2012-06-12 (×2): 10 mg via ORAL
  Administered 2012-06-13 – 2012-06-14 (×2): 5 mg via ORAL
  Filled 2012-06-11: qty 2
  Filled 2012-06-11: qty 1
  Filled 2012-06-11 (×3): qty 2
  Filled 2012-06-11 (×2): qty 1
  Filled 2012-06-11 (×4): qty 2
  Filled 2012-06-11: qty 1

## 2012-06-11 MED ORDER — ALUM & MAG HYDROXIDE-SIMETH 200-200-20 MG/5ML PO SUSP
30.0000 mL | ORAL | Status: DC | PRN
  Administered 2012-06-11 (×2): 30 mL via ORAL
  Filled 2012-06-11 (×2): qty 30

## 2012-06-11 MED ORDER — CALCIUM CARBONATE ANTACID 500 MG PO CHEW
1.0000 | CHEWABLE_TABLET | Freq: Three times a day (TID) | ORAL | Status: DC | PRN
  Administered 2012-06-11: 200 mg via ORAL
  Filled 2012-06-11 (×3): qty 1

## 2012-06-11 MED ORDER — HYDRALAZINE HCL 20 MG/ML IJ SOLN
10.0000 mg | INTRAMUSCULAR | Status: DC | PRN
  Administered 2012-06-11: 10 mg via INTRAVENOUS
  Filled 2012-06-11: qty 1
  Filled 2012-06-11: qty 0.5

## 2012-06-11 MED ORDER — SENNOSIDES-DOCUSATE SODIUM 8.6-50 MG PO TABS
1.0000 | ORAL_TABLET | Freq: Two times a day (BID) | ORAL | Status: DC
  Administered 2012-06-11 – 2012-06-15 (×7): 1 via ORAL
  Filled 2012-06-11 (×11): qty 1

## 2012-06-11 MED ORDER — METOPROLOL TARTRATE 50 MG PO TABS
50.0000 mg | ORAL_TABLET | Freq: Two times a day (BID) | ORAL | Status: DC
  Administered 2012-06-11 – 2012-06-15 (×8): 50 mg via ORAL
  Filled 2012-06-11 (×9): qty 1

## 2012-06-11 MED ORDER — LEVALBUTEROL HCL 0.63 MG/3ML IN NEBU
0.6300 mg | INHALATION_SOLUTION | RESPIRATORY_TRACT | Status: DC | PRN
  Administered 2012-06-12: 0.63 mg via RESPIRATORY_TRACT
  Filled 2012-06-11 (×2): qty 3

## 2012-06-11 MED ORDER — POLYETHYLENE GLYCOL 3350 17 G PO PACK
17.0000 g | PACK | Freq: Every day | ORAL | Status: DC
  Administered 2012-06-11 – 2012-06-13 (×3): 17 g via ORAL
  Filled 2012-06-11 (×3): qty 1

## 2012-06-11 MED ORDER — METHYLPREDNISOLONE SODIUM SUCC 125 MG IJ SOLR
125.0000 mg | Freq: Once | INTRAMUSCULAR | Status: AC
  Administered 2012-06-11: 125 mg via INTRAVENOUS
  Filled 2012-06-11 (×2): qty 2

## 2012-06-11 NOTE — Progress Notes (Signed)
ANTICOAGULATION CONSULT NOTE  Pharmacy Consult for heparin Indication: pulmonary embolus  No Known Allergies  Patient Measurements: Height: 5\' 11"  (180.3 cm) Weight: 199 lb 4.7 oz (90.4 kg) IBW/kg (Calculated) : 75.3 Heparin Dosing Weight: 92 kg  Vital Signs: Temp: 97.9 F (36.6 C) (02/11 2341) Temp src: Oral (02/11 2341) BP: 144/63 mmHg (02/11 2341) Pulse Rate: 92 (02/11 2341)  Labs:  Recent Labs  06/10/12 1116 06/10/12 1555 06/10/12 2021 06/10/12 2233  HGB 13.6  --   --   --   HCT 39.7  --   --   --   PLT 208  --   --   --   HEPARINUNFRC  --   --   --  0.31  CREATININE 0.89  --   --   --   TROPONINI  --  <0.30 <0.30  --     Estimated Creatinine Clearance: 81.2 ml/min (by C-G formula based on Cr of 0.89).  Assessment: 79 year old man with saddle PE for heparin  Goal of Therapy:  Heparin level 0.3-0.7 units/ml Monitor platelets by anticoagulation protocol: Yes   Plan:  Increase Heparin 1600 units/hr to keep in range.   Check heparin level in 6 hours.  Geannie Risen, PharmD, BCPS

## 2012-06-11 NOTE — Progress Notes (Signed)
SUBJECTIVE: The patient is doing well today.  His pain is improved.  He denies SOB.  He is stable  . antiseptic oral rinse  15 mL Mouth Rinse BID  . metoprolol tartrate  25 mg Oral Daily  . simvastatin  5 mg Oral q1800  . sodium chloride  3 mL Intravenous Q12H  . sodium chloride  3 mL Intravenous Q12H   . heparin 1,600 Units/hr (06/11/12 0600)    OBJECTIVE: Physical Exam: Filed Vitals:   06/11/12 0440 06/11/12 0546 06/11/12 0600 06/11/12 0734  BP:   147/84 129/77  Pulse:   97 94  Temp:    98.2 F (36.8 C)  TempSrc:    Oral  Resp:   23 22  Height:      Weight: 200 lb 9.9 oz (91 kg)     SpO2:  95% 94% 96%    Intake/Output Summary (Last 24 hours) at 06/11/12 1051 Last data filed at 06/11/12 0700  Gross per 24 hour  Intake 1246.98 ml  Output    550 ml  Net 696.98 ml    Telemetry reveals sinus rhythm  GEN- The patient is well appearing, alert and oriented x 3 today.   Head- normocephalic, atraumatic Eyes-  Sclera clear, conjunctiva pink Ears- hearing intact Oropharynx- clear Neck- supple,  Lungs- Clear to ausculation bilaterally, normal work of breathing Heart- Regular rate and rhythm, no murmurs, rubs or gallops, PMI not laterally displaced, no RV heave, P2 is normal GI- soft, NT, ND, + BS Extremities- no clubbing, cyanosis, +1 LLE edema Skin- no rash or lesion Psych- euthymic mood, full affect Neuro- strength and sensation are intact  LABS: Basic Metabolic Panel:  Recent Labs  30/86/57 1116 06/10/12 2233 06/11/12 0817  NA 135  --  138  K 3.5  --  4.0  CL 94*  --  101  CO2 30  --  27  GLUCOSE 153*  --  117*  BUN 19  --  20  CREATININE 0.89  --  0.79  CALCIUM 9.4  --  8.7  MG  --  2.0  --    Liver Function Tests:  Recent Labs  06/10/12 1116 06/11/12 0817  AST 17 15  ALT 15 11  ALKPHOS 60 67  BILITOT 0.9 0.8  PROT 7.6 6.9  ALBUMIN 3.7 3.1*   No results found for this basename: LIPASE, AMYLASE,  in the last 72 hours CBC:  Recent Labs  06/10/12 1116 06/11/12 0817  WBC 19.1* 17.2*  NEUTROABS 15.7*  --   HGB 13.6 12.1*  HCT 39.7 36.1*  MCV 92.1 94.0  PLT 208 200   Cardiac Enzymes:  Recent Labs  06/10/12 2021 06/11/12 0125 06/11/12 0816  TROPONINI <0.30 <0.30 <0.30   BNP: No components found with this basename: POCBNP,  D-Dimer: No results found for this basename: DDIMER,  in the last 72 hours Hemoglobin A1C: No results found for this basename: HGBA1C,  in the last 72 hours Fasting Lipid Panel: No results found for this basename: CHOL, HDL, LDLCALC, TRIG, CHOLHDL, LDLDIRECT,  in the last 72 hours Thyroid Function Tests:  Recent Labs  06/10/12 2233  TSH 0.458   Anemia Panel: No results found for this basename: VITAMINB12, FOLATE, FERRITIN, TIBC, IRON, RETICCTPCT,  in the last 72 hours  RADIOLOGY: Ct Angio Chest W/cm &/or Wo Cm  06/10/2012  *RADIOLOGY REPORT*  Clinical Data:  Right shoulder pain, shortness of breath, flank pain.  CT ANGIOGRAPHY CHEST CT ABDOMEN AND PELVIS WITH  CONTRAST  Technique:  Multidetector CT imaging of the chest was performed using the standard protocol during bolus administration of intravenous contrast.  Multiplanar CT image reconstructions including MIPs were obtained to evaluate the vascular anatomy. Multidetector CT imaging of the abdomen and pelvis was performed using the standard protocol during bolus administration of intravenous contrast.  Contrast: 80mL OMNIPAQUE IOHEXOL 350 MG/ML SOLN  Comparison:   None.  CTA CHEST  Findings:  There is a saddle embolus across the bifurcation of the main pulmonary artery, nearly occlusive at it extent into bilateral lower lobe branches. Adequate contrast opacification of the thoracic aorta with no evidence of dissection, aneurysm, or stenosis. There is classic 3-vessel brachiocephalic arch anatomy. Patchy coronary and aortic calcifications.  No pleural or pericardial effusion.  Sub centimeter precarinal and subcarinal lymph nodes.  No definite  hilar adenopathy.  Extensive ground-glass opacities throughout much of the right lower lobe with some patchy interspersed airspace disease.  There is mild dependent atelectasis posteriorly in the left lower lobe.  Linear scarring or subsegmental atelectasis in the lingula and right middle lobe. Regional bones unremarkable.   Review of the MIP images confirms the above findings.  IMPRESSION:  1.  Saddle pulmonary embolus with occlusive components extending into bilateral lower lobe branches. 2.  Alveolar and airspace opacities throughout   much of the right lower lobe, differential diagnosis pulmonary infarct, pneumonia, other inflammatory or infiltrative processes.  I telephoned the critical test results to Dr. Silverio Lay  at the time of interpretation.  CT ABDOMEN AND PELVIS  Findings: Scattered atheromatous plaque in the abdominal aorta with a fusiform 3.8 cm infrarenal aneurysm.  No dissection or stenosis. No retroperitoneal hemorrhage.  Small hiatal hernia.  Stomach, small bowel, and colon are nondilated.  No ascites.  No free air.  Unremarkable liver, gallbladder, spleen, adrenal glands, kidneys.  Portal vein patent. Spondylitic changes in the lower lumbar spine. Urinary bladder physiologically distended.  Right inguinal hernia containing only mesenteric fat.  No adenopathy localized.  Review of the MIP images confirms the above findings.  IMPRESSION:  1.  No acute abdominal process. 2.  3.8 cm abdominal aortic aneurysm. Recommend followup by [place] in 2 years.  This recommendation follows ACR consensus guidelines: White Paper of the ACR Incidental Findings Committee II on Vascular Findings.  J Am Coll Radiol 2013; 10:789-794. 3.  Small hiatal hernia.   Original Report Authenticated By: D. Andria Rhein, MD    US Abdomen Complete  06/10/2012  *RADIOLOGY REPORT*  Clinical Data:  Abdominal pain, back pain  COMPLETE ABDOMINAL ULTRASOUND  Comparison:  Lumbar MRI 08/03/2008.  Findings: Study is limited by abundant bowel gas  and patient's large body habitus.  No sonographic Murphy's sign.  Gallbladder:  No gallstones, gallbladder wall thickening, or pericholecystic fluid.  Common bile duct:  Measures 4.8 mm in diameter.  Liver:  No focal lesion identified.  Within normal limits in parenchymal echogenicity.  IVC: Not visualized due to abundant bowel gas  Pancreas:  Not visualized  Spleen:  Measures 5.7 cm in length.  Normal echogenicity.  Right Kidney:  Measures 12.4 cm in length.  No mass, hydronephrosis or diagnostic renal calculus  Left Kidney:  Measures 12.3 cm in length.  No mass, hydronephrosis or diagnostic renal calculus.  Abdominal aorta: There is aneurysm of the abdominal aorta measures 4.3 x 3.4 cm extending 3.8 cm cranial caudally.  IMPRESSION:  1.  No gallstones are noted within gallbladder.  Normal CBD. 2.  No hydronephrosis or diagnostic renal  calculus. 3.  Aneurysm of the abdominal aorta measures 4.3 x 3.4 cm.   Original Report Authenticated By: Natasha Mead, M.D.    Ct Abdomen Pelvis W Contrast  06/10/2012  *RADIOLOGY REPORT*  Clinical Data:  Right shoulder pain, shortness of breath, flank pain.  CT ANGIOGRAPHY CHEST CT ABDOMEN AND PELVIS WITH CONTRAST  Technique:  Multidetector CT imaging of the chest was performed using the standard protocol during bolus administration of intravenous contrast.  Multiplanar CT image reconstructions including MIPs were obtained to evaluate the vascular anatomy. Multidetector CT imaging of the abdomen and pelvis was performed using the standard protocol during bolus administration of intravenous contrast.  Contrast: 80mL OMNIPAQUE IOHEXOL 350 MG/ML SOLN  Comparison:   None.  CTA CHEST  Findings:  There is a saddle embolus across the bifurcation of the main pulmonary artery, nearly occlusive at it extent into bilateral lower lobe branches. Adequate contrast opacification of the thoracic aorta with no evidence of dissection, aneurysm, or stenosis. There is classic 3-vessel brachiocephalic  arch anatomy. Patchy coronary and aortic calcifications.  No pleural or pericardial effusion.  Sub centimeter precarinal and subcarinal lymph nodes.  No definite hilar adenopathy.  Extensive ground-glass opacities throughout much of the right lower lobe with some patchy interspersed airspace disease.  There is mild dependent atelectasis posteriorly in the left lower lobe.  Linear scarring or subsegmental atelectasis in the lingula and right middle lobe. Regional bones unremarkable.   Review of the MIP images confirms the above findings.  IMPRESSION:  1.  Saddle pulmonary embolus with occlusive components extending into bilateral lower lobe branches. 2.  Alveolar and airspace opacities throughout   much of the right lower lobe, differential diagnosis pulmonary infarct, pneumonia, other inflammatory or infiltrative processes.  I telephoned the critical test results to Dr. Silverio Lay  at the time of interpretation.  CT ABDOMEN AND PELVIS  Findings: Scattered atheromatous plaque in the abdominal aorta with a fusiform 3.8 cm infrarenal aneurysm.  No dissection or stenosis. No retroperitoneal hemorrhage.  Small hiatal hernia.  Stomach, small bowel, and colon are nondilated.  No ascites.  No free air.  Unremarkable liver, gallbladder, spleen, adrenal glands, kidneys.  Portal vein patent. Spondylitic changes in the lower lumbar spine. Urinary bladder physiologically distended.  Right inguinal hernia containing only mesenteric fat.  No adenopathy localized.  Review of the MIP images confirms the above findings.  IMPRESSION:  1.  No acute abdominal process. 2.  3.8 cm abdominal aortic aneurysm. Recommend followup by [place] in 2 years.  This recommendation follows ACR consensus guidelines: White Paper of the ACR Incidental Findings Committee II on Vascular Findings.  J Am Coll Radiol 2013; 10:789-794. 3.  Small hiatal hernia.   Original Report Authenticated By: D. Andria Rhein, MD    Dg Chest Port 1 View  06/11/2012  *RADIOLOGY REPORT*   Clinical Data: Acute onset of shortness of breath and wheezing.  PORTABLE CHEST - 1 VIEW  Comparison: Chest radiograph and CT of the chest performed 06/10/2012  Findings: The lungs are hypoexpanded.  Mild right basilar airspace opacity may reflect pulmonary infarct, given its appearance on CT. No definite pleural effusion or pneumothorax is seen.  The cardiomediastinal silhouette is borderline normal in size.  No acute osseous abnormalities are identified.  IMPRESSION: Lungs hypoexpanded; mild right basilar airspace opacity may reflect pulmonary infarct, given its appearance on recent CT.  No significant change from the prior study.   Original Report Authenticated By: Tonia Ghent, M.D.    Dg Chest  Port 1 View  06/10/2012  *RADIOLOGY REPORT*  Clinical Data: Back pain.  PORTABLE CHEST - 1 VIEW  Comparison: None.  Findings: Low lung volumes with bibasilar atelectasis.  Heart size is accentuated by the low volumes.  No acute bony abnormality.  IMPRESSION: Low lung volumes with bibasilar opacities, likely atelectasis.   Original Report Authenticated By: Charlett Nose, M.D.     ASSESSMENT AND PLAN:  Principal Problem:   Saddle pulmonary embolus Active Problems:   Hyperlipidemia   Hypertension   Chronic low back pain   Atrial fibrillation   AAA (abdominal aortic aneurysm) without rupture/ 3.8 cm   Acute respiratory failure with hypoxia   Leukocytopenia, unspecified  1. PTE Pt diagnosed with saddle PTE.  Ultrasound of L leg is pending. Continue IV heparin for now.  He is hemodynamically stable.  If he has LLE DVT, then IVC filter is probable necessary.  Once we make this decision and if echo looks OK, would convert heparin to xarelto 15mg  BID. Keep in stepdown today  2. AAA Follow as per radiology with outpatient [place] in 2 years  3. AFib Likely due to large PTE Will anticoagulate as above Now back in sinus  Hillis Range, MD 06/11/2012 10:51 AM

## 2012-06-11 NOTE — Progress Notes (Signed)
Pt has new onset of SOB with minimal exertion and audible wheezes without auscultation.  Lungs have expiratory wheezes with diminished bases. Pt 95% on 3L [place]. Non-productive cough. Call placed to Craige Cotta, NP. New orders received. Will monitor.   M.Foster Simpson, RN

## 2012-06-11 NOTE — ED Provider Notes (Signed)
.  Face to face Exam:  General:  A&Ox3 HEENT:  Atraumatic Resp:  Normal effort Abd:  Nondistended Neuro:No focal deficits    Nelia Shi, MD 06/11/12 [phone]

## 2012-06-11 NOTE — Progress Notes (Signed)
ANTICOAGULATION CONSULT NOTE  Pharmacy Consult for heparin Indication: pulmonary embolus  No Known Allergies  Patient Measurements: Height: 5\' 11"  (180.3 cm) Weight: 200 lb 9.9 oz (91 kg) IBW/kg (Calculated) : 75.3 Heparin Dosing Weight: 92 kg  Vital Signs: Temp: 98.2 F (36.8 C) (02/12 0734) Temp src: Oral (02/12 0734) BP: 129/77 mmHg (02/12 0734) Pulse Rate: 94 (02/12 0734)  Labs:  Recent Labs  06/10/12 1116 06/10/12 1555 06/10/12 2021 06/10/12 2233 06/11/12 0125 06/11/12 0817  HGB 13.6  --   --   --   --  12.1*  HCT 39.7  --   --   --   --  36.1*  PLT 208  --   --   --   --  200  HEPARINUNFRC  --   --   --  0.31  --  0.53  CREATININE 0.89  --   --   --   --   --   TROPONINI  --  <0.30 <0.30  --  <0.30  --     Estimated Creatinine Clearance: 81.5 ml/min (by C-G formula based on Cr of 0.89).  Assessment: 79 year old man with saddle PE for heparin. Heparin level therapeutic this AM.  Goal of Therapy:  Heparin level 0.3-0.7 units/ml Monitor platelets by anticoagulation protocol: Yes   Plan:  Cont heparin at 1600 units/hr F/u with daily level

## 2012-06-11 NOTE — Progress Notes (Signed)
Pt still c/o of 9/10 sharp back/shoulder pain after Morphine 2mg  IV x3 doses and Tramadol 100mg  PO. Call placed to Craige Cotta, NP. New orders received. Will monitor.  M.Foster Simpson, RN

## 2012-06-11 NOTE — Progress Notes (Signed)
VASCULAR LAB PRELIMINARY  PRELIMINARY  PRELIMINARY  PRELIMINARY  Left lower extremity venous duplex completed.    Preliminary report:  Left: Sub acute  DVT noted in the distal FV, pop v, PTV, and pero v.  No evidence of superficial thrombosis.  No Baker's cyst.   Amay Mijangos, RVT 06/11/2012, 11:50 AM

## 2012-06-11 NOTE — Progress Notes (Signed)
[facility] Progress Note [place] TEAM 1 - Stepdown/ICU TEAM   ZAMARI VEA ZOX:[id number] DOB: [birth year] DOA: 06/10/2012 PCP: [name], MD  Brief narrative: 79 year old male patient who developed severe pain of the right scapula at 7 PM on the night of admission. He presented to the emergency department and was found to be in new-onset atrial fibrillation. This was associated with hypoxemia and shortness of breath. He was started empirically on IV Cardizem with rapid conversion back to sinus rhythm. He was evaluated by cardiology who recommended a CT angio of the chest which demonstrated saddle pulmonary emboli. He was subsequently started on  IV heparin. He has remained hemodynamically stable. In addition to the above findings EKG was consistent with LVH but no acute ischemic ST abnormalities, he had leukocytosis with a white count of 19,000 and an elevated serum lactate. CT the chest also demonstrated alveolar and airspace opacities throughout the right lower lobe consistent with either pneumonia or pulmonary infarct. He was admitted to step down.  Assessment/Plan:  Acute respiratory failure with hypoxia due to Saddle pulmonary embolus  -remains on [place] oxygen w/ occ. desats and recurrent pleuritic pain -no provoking factors-may need eventual hematological eval. -continue Heparin-consider transition to Xarelto when more clinically stable  -maintain bedrest -presented with lactic acidosis- repeat lactic acid now normal  Acute Pulmonary HTN (46 mmHg) -due to acute PE- no RV strain seen on ECHO  New onset atrial fibrillation -RESOLVED- suspect due to significant PE  Pleuritic pain -from PE and associated pulmonary infarcts -symptomatic rxn  AAA (abdominal aortic aneurysm) without rupture/ 3.8 cm -will follow up imaging (CT vs abd. [place]) in next 6 months and likely would benefit from OP vascular eval  Hyperlipidemia -cont. statin   Hypertension -cont B  blocker  DVT prophylaxis: Full dose heparin Code Status: Full Family Communication: Patient Disposition Plan: Step down  Consultants: None  Procedures:  2-D echocardiogram (06/10/12): Study Conclusions - Left ventricle: The cavity size was normal. Wall thickness was normal. Systolic function was normal. The estimated ejection fraction was in the range of 55% to 60%. - Aortic valve: Calcified non coronary cusp - Left atrium: The atrium was mildly dilated. - Pulmonary arteries: PA peak pressure: 46mm Hg (S).  Lower extremity venous duplex (06/11/12): Preliminary report: Left: Sub acute DVT noted in the distal FV, pop v, PTV, and pero v. No evidence of superficial thrombosis. No Baker's cyst.   Antibiotics: Zosyn 2/11 Vancomycin 2/11   HPI/Subjective: Continues to endorse a pleuritic pain with deep breathing. Denies chest pain. Still somewhat short of breath especially with movement. Denies nausea or abdominal pain.  Objective: Blood pressure 134/81, pulse 85, temperature 98.2 F (36.8 C), temperature source Oral, resp. rate 12, height 5\' 11"  (1.803 m), weight 91 kg (200 lb 9.9 oz), SpO2 97.00%.  Intake/Output Summary (Last 24 hours) at 06/11/12 1203 Last data filed at 06/11/12 1151  Gross per 24 hour  Intake 1435.81 ml  Output    600 ml  Net 835.81 ml   Exam: General: No acute respiratory distress Lungs: Clear to auscultation bilaterally without wheezes or crackles, nasal cannula Cardiovascular: Regular rate and sinus rhythm without murmur gallop or rub normal S1 and S2, focal edema right lower extremity from ankle to knee, significant bilateral JVD about 7 cm Abdomen: Nontender, nondistended, soft, bowel sounds positive, no rebound, no ascites, no appreciable mass Extremities: No significant cyanosis, clubbing of bilateral lower extremities Neurological: Wears glasses, alert and oriented x3, moves all extremities x4,  exam nonfocal, cranial nerves II through XII grossly  intact  Data Reviewed: Basic Metabolic Panel:  Recent Labs Lab 06/10/12 1116 06/10/12 2233 06/11/12 0817  NA 135  --  138  K 3.5  --  4.0  CL 94*  --  101  CO2 30  --  27  GLUCOSE 153*  --  117*  BUN 19  --  20  CREATININE 0.89  --  0.79  CALCIUM 9.4  --  8.7  MG  --  2.0  --    Liver Function Tests:  Recent Labs Lab 06/10/12 1116 06/11/12 0817  AST 17 15  ALT 15 11  ALKPHOS 60 67  BILITOT 0.9 0.8  PROT 7.6 6.9  ALBUMIN 3.7 3.1*   CBC:  Recent Labs Lab 06/10/12 1116 06/11/12 0817  WBC 19.1* 17.2*  NEUTROABS 15.7*  --   HGB 13.6 12.1*  HCT 39.7 36.1*  MCV 92.1 94.0  PLT 208 200   Cardiac Enzymes:  Recent Labs Lab 06/10/12 1555 06/10/12 2021 06/11/12 0125 06/11/12 0816  TROPONINI <0.30 <0.30 <0.30 <0.30   CBG:  Recent Labs Lab 06/10/12 2340  GLUCAP 120*    Recent Results (from the past 240 hour(s))  MRSA PCR SCREENING     Status: None   Collection Time    06/10/12  8:02 PM      Result Value Range Status   MRSA by PCR NEGATIVE  NEGATIVE Final   Comment:            The GeneXpert MRSA Assay (FDA     approved for NASAL specimens     only), is one component of a     comprehensive MRSA colonization     surveillance program. It is not     intended to diagnose MRSA     infection nor to guide or     monitor treatment for     MRSA infections.     Studies:  Recent x-ray studies have been reviewed in detail by the Attending Physician  Scheduled Meds:  Reviewed in detail by the Attending Physician   Junious Silk, ANP [facility] Office  [phone] Pager [phone]  On-Call/Text Page:      [email or web address]      password [email or web address]  If 7PM-7AM, please contact night-coverage [email or web address] Password [email or web address] 06/11/2012, 12:03 PM   LOS: 1 day   I have personally examined this patient and reviewed the entire database. I have reviewed the above note, made any necessary editorial changes, and agree with its content.  Lonia Blood,  MD [facility]

## 2012-06-11 NOTE — Progress Notes (Signed)
Utilization Review Completed. 06/11/2012  

## 2012-06-12 LAB — CBC
Hemoglobin: 12 g/dL — ABNORMAL LOW (ref 13.0–17.0)
MCH: 31.1 pg (ref 26.0–34.0)
Platelets: 250 10*3/uL (ref 150–400)
RBC: 3.86 MIL/uL — ABNORMAL LOW (ref 4.22–5.81)
WBC: 24.2 10*3/uL — ABNORMAL HIGH (ref 4.0–10.5)

## 2012-06-12 LAB — HEPARIN LEVEL (UNFRACTIONATED): Heparin Unfractionated: 0.33 IU/mL (ref 0.30–0.70)

## 2012-06-12 MED ORDER — WHITE PETROLATUM GEL
Status: AC
  Administered 2012-06-12: 12:00:00
  Filled 2012-06-12: qty 5

## 2012-06-12 MED ORDER — HEPARIN (PORCINE) IN NACL 100-0.45 UNIT/ML-% IJ SOLN
1750.0000 [IU]/h | INTRAMUSCULAR | Status: DC
  Filled 2012-06-12 (×2): qty 250

## 2012-06-12 MED ORDER — RIVAROXABAN 20 MG PO TABS: 20.0000 mg | ORAL_TABLET | Freq: Every day | ORAL | Status: DC

## 2012-06-12 MED ORDER — RIVAROXABAN 15 MG PO TABS
15.0000 mg | ORAL_TABLET | Freq: Two times a day (BID) | ORAL | Status: DC
  Administered 2012-06-12 – 2012-06-15 (×7): 15 mg via ORAL
  Filled 2012-06-12 (×8): qty 1

## 2012-06-12 NOTE — Progress Notes (Signed)
Patient Name: Jerry Fox      SUBJECTIVE: dmitted with back pain and af  Found to have saddle embolus (PE) great pick up DB!!!! Feeling much better  Past Medical History  Diagnosis Date  . Arthritis   . Murmur, cardiac   . Hypertension   . Colon polyps   . Hyperlipidemia     PHYSICAL EXAM Filed Vitals:   06/12/12 0047 06/12/12 0400 06/12/12 0451 06/12/12 0821  BP: 144/75 120/63  113/61  Pulse: 103 74 95 91  Temp:  98.1 F (36.7 C)  98.2 F (36.8 C)  TempSrc:  Oral  Oral  Resp: 22 8 14 9   Height:      Weight:   204 lb 12.9 oz (92.9 kg)   SpO2: 97% 96% 96% 96%   Well developed and nourished in no acute distress HENT normal Neck supple with JVP 8-10 Clear Regular rate and rhythm, no murmurs or gallops Abd-soft with active BS No Clubbing cyanosis L>R edema Skin-warm and dry A & Oriented  Grossly normal sensory and motor function  TELEMETRY: Reviewed telemetry pt in sinus tach    Intake/Output Summary (Last 24 hours) at 06/12/12 0959 Last data filed at 06/12/12 0600  Gross per 24 hour  Intake 1034.75 ml  Output   1150 ml  Net -115.25 ml    LABS: Basic Metabolic Panel:  Recent Labs Lab 06/10/12 1116 06/10/12 2233 06/11/12 0817  NA 135  --  138  K 3.5  --  4.0  CL 94*  --  101  CO2 30  --  27  GLUCOSE 153*  --  117*  BUN 19  --  20  CREATININE 0.89  --  0.79  CALCIUM 9.4  --  8.7  MG  --  2.0  --    Cardiac Enzymes:  Recent Labs  06/10/12 2021 06/11/12 0125 06/11/12 0816  TROPONINI <0.30 <0.30 <0.30   CBC:  Recent Labs Lab 06/10/12 1116 06/11/12 0817 06/12/12 0502  WBC 19.1* 17.2* 24.2*  NEUTROABS 15.7*  --   --   HGB 13.6 12.1* 12.0*  HCT 39.7 36.1* 36.3*  MCV 92.1 94.0 94.0  PLT 208 200 250   PROTIME: No results found for this basename: LABPROT, INR,  in the last 72 hours Liver Function Tests:  Recent Labs  06/10/12 1116 06/11/12 0817  AST 17 15  ALT 15 11  ALKPHOS 60 67  BILITOT 0.9 0.8  PROT 7.6 6.9    ALBUMIN 3.7 3.1*   No results found for this basename: LIPASE, AMYLASE,  in the last 72 hours BNP: BNP (last 3 results) No results found for this basename: PROBNP,  in the last 8760 hours D-Dimer: No results found for this basename: DDIMER,  in the last 72 hours Hemoglobin A1C:  Recent Labs  06/10/12 2233  HGBA1C 5.2   Fasting Lipid Panel: No results found for this basename: CHOL, HDL, LDLCALC, TRIG, CHOLHDL, LDLDIRECT,  in the last 72 hours Thyroid Function Tests:  Recent Labs  06/10/12 2233  TSH 0.458     ASSESSMENT AND PLAN:  Principal Problem:   Acute respiratory failure with hypoxia Active Problems:     Saddle pulmonary embolus   New onset atrial fibrillation   Continue anticoagulation pt on heparin but not coumadin??? Plan to transition to Rivaroxaban which is reasonable Duration to be determined as no provocative factors Will follow,, Would interpret afib as secondary to PE and NOT its own problem in terms of long  term anticoagulation    Signed, [name] MD  06/12/2012

## 2012-06-12 NOTE — Progress Notes (Signed)
Report called to Susie, RN on unit 2000.  Wife at bedside and aware of transfer to 2027.

## 2012-06-12 NOTE — Progress Notes (Signed)
Pt's copay for Xarelto 15 mg BID will be $45.00 for a 30-day supply.

## 2012-06-12 NOTE — Progress Notes (Addendum)
ANTICOAGULATION CONSULT NOTE  Pharmacy Consult for heparin Indication: pulmonary embolus  No Known Allergies  Patient Measurements: Height: 5\' 11"  (180.3 cm) Weight: 204 lb 12.9 oz (92.9 kg) IBW/kg (Calculated) : 75.3 Heparin Dosing Weight: 92 kg  Vital Signs: Temp: 98.1 F (36.7 C) (02/13 0400) Temp src: Oral (02/13 0400) BP: 120/63 mmHg (02/13 0400) Pulse Rate: 95 (02/13 0451)  Labs:  Recent Labs  06/10/12 1116  06/10/12 2021 06/10/12 2233 06/11/12 0125 06/11/12 0816 06/11/12 0817 06/12/12 0502  HGB 13.6  --   --   --   --   --  12.1* 12.0*  HCT 39.7  --   --   --   --   --  36.1* 36.3*  PLT 208  --   --   --   --   --  200 250  HEPARINUNFRC  --   --   --  0.31  --   --  0.53 0.33  CREATININE 0.89  --   --   --   --   --  0.79  --   TROPONINI  --   < > <0.30  --  <0.30 <0.30  --   --   < > = values in this interval not displayed.  Estimated Creatinine Clearance: 91.4 ml/min (by C-G formula based on Cr of 0.79).  Assessment: 79 year old man with saddle PE for heparin. Heparin level therapeutic this AM but trending lower so will adjust rate. Plan to transition to Xarelto when stable.   Goal of Therapy:  Heparin level 0.3-0.7 units/ml Monitor platelets by anticoagulation protocol: Yes   Plan:  Increase heparin to 1750 units/hr F/u with daily level  Addendum:  Change to Xarelto.  Plan  Xarelto 15mg  bid x3 wks then 20mg  qday

## 2012-06-12 NOTE — Progress Notes (Signed)
[facility] Progress Note [place] TEAM 1 - Stepdown/ICU TEAM   KORT STETTLER ZOX:[id number] DOB: [birth year] DOA: 06/10/2012 PCP: [name], MD  Brief narrative: 79 year old male patient who developed severe pain of the right scapula at 7 PM on the night of admission. He presented to the emergency department and was found to be in new-onset atrial fibrillation. This was associated with hypoxemia and shortness of breath. He was started empirically on IV Cardizem with rapid conversion back to sinus rhythm. He was evaluated by cardiology who recommended a CT angio of the chest which demonstrated saddle pulmonary emboli. He was subsequently started on  IV heparin. He has remained hemodynamically stable. In addition to the above findings EKG was consistent with LVH but no acute ischemic ST abnormalities, he had leukocytosis with a white count of 19,000 and an elevated serum lactate. CT the chest also demonstrated alveolar and airspace opacities throughout the right lower lobe consistent with either pneumonia or pulmonary infarct. He was admitted to step down.  Assessment/Plan:  Acute respiratory failure with hypoxia due to Saddle pulmonary embolus  -Currently down to 2 L of O2-continue to attempt to wean O2 -Provoking factors include chronic back pain and decreased mobility due to fluid -continue Heparin-ask Pharmacy to transition to Xarelto (co-pay is $45) -mobilize -presented with lactic acidosis- repeat lactic acid now normal  Acute Pulmonary HTN (46 mmHg) -due to acute PE- no RV strain seen on ECHO  New onset atrial fibrillation -RESOLVED- suspect due to significant PE  Pleuritic pain/leukocytosis -from PE and associated pulmonary infarcts -symptomatic rxn  AAA (abdominal aortic aneurysm) without rupture/ 3.8 cm -will follow up imaging (CT vs abd. [place]) in next 6 months and likely would benefit from OP vascular eval  Hyperlipidemia -cont. statin    Hypertension -cont B blocker  DVT prophylaxis: Full dose heparin Code Status: Full Family Communication: Patient Disposition Plan: Transfer to Telemetry  Consultants: CARDIOLOGY  Procedures:  2-D echocardiogram (06/10/12): Study Conclusions - Left ventricle: The cavity size was normal. Wall thickness was normal. Systolic function was normal. The estimated ejection fraction was in the range of 55% to 60%. - Aortic valve: Calcified non coronary cusp - Left atrium: The atrium was mildly dilated. - Pulmonary arteries: PA peak pressure: 46mm Hg (S).  Lower extremity venous duplex (06/11/12): Summary: Findings consistent with subacute deep vein thrombosis involving the left femoral vein, left popliteal vein, left posterial tibial vein, and left peroneal vein.   Antibiotics: Zosyn 2/11 >>>2/12 Vancomycin 2/11 >>>2/12  HPI/Subjective: Much less DOE and pleuritic pain w/ breathing  Objective: Blood pressure 113/61, pulse 91, temperature 98.2 F (36.8 C), temperature source Oral, resp. rate 9, height 5\' 11"  (1.803 m), weight 92.9 kg (204 lb 12.9 oz), SpO2 96.00%.  Intake/Output Summary (Last 24 hours) at 06/12/12 1103 Last data filed at 06/12/12 0900  Gross per 24 hour  Intake 1462.75 ml  Output   1150 ml  Net 312.75 ml   Exam: General: No acute respiratory distress Lungs: Coarse crackles in right lower lobe, clear lungs otherwise Cardiovascular: Regular rate and sinus rhythm without murmur gallop or rub normal S1 and S2, focal edema left lower extremity from ankle to knee, significant bilateral JVD about 7 cm Abdomen: Nontender, nondistended, soft, bowel sounds positive, no rebound, no ascites, no appreciable mass Extremities: No significant cyanosis, clubbing of bilateral lower extremities Neurological: Wears glasses, alert and oriented x3, moves all extremities x4, exam nonfocal, cranial nerves II through XII grossly intact  Data Reviewed: Basic  Metabolic  Panel:  Recent Labs Lab 06/10/12 1116 06/10/12 2233 06/11/12 0817  NA 135  --  138  K 3.5  --  4.0  CL 94*  --  101  CO2 30  --  27  GLUCOSE 153*  --  117*  BUN 19  --  20  CREATININE 0.89  --  0.79  CALCIUM 9.4  --  8.7  MG  --  2.0  --    Liver Function Tests:  Recent Labs Lab 06/10/12 1116 06/11/12 0817  AST 17 15  ALT 15 11  ALKPHOS 60 67  BILITOT 0.9 0.8  PROT 7.6 6.9  ALBUMIN 3.7 3.1*   CBC:  Recent Labs Lab 06/10/12 1116 06/11/12 0817 06/12/12 0502  WBC 19.1* 17.2* 24.2*  NEUTROABS 15.7*  --   --   HGB 13.6 12.1* 12.0*  HCT 39.7 36.1* 36.3*  MCV 92.1 94.0 94.0  PLT 208 200 250   Cardiac Enzymes:  Recent Labs Lab 06/10/12 1555 06/10/12 2021 06/11/12 0125 06/11/12 0816  TROPONINI <0.30 <0.30 <0.30 <0.30   CBG:  Recent Labs Lab 06/10/12 2340  GLUCAP 120*    Recent Results (from the past 240 hour(s))  URINE CULTURE     Status: None   Collection Time    06/10/12  1:02 PM      Result Value Range Status   Specimen Description URINE, CLEAN CATCH   Final   Special Requests ADD [phone] [id number]   Final   Culture  Setup Time 06/10/2012 16:15   Final   Colony Count NO GROWTH   Final   Culture NO GROWTH   Final   Report Status 06/11/2012 FINAL   Final  MRSA PCR SCREENING     Status: None   Collection Time    06/10/12  8:02 PM      Result Value Range Status   MRSA by PCR NEGATIVE  NEGATIVE Final   Comment:            The GeneXpert MRSA Assay (FDA     approved for NASAL specimens     only), is one component of a     comprehensive MRSA colonization     surveillance program. It is not     intended to diagnose MRSA     infection nor to guide or     monitor treatment for     MRSA infections.     Studies:  Recent x-ray studies have been reviewed in detail by the Attending Physician  Scheduled Meds:  Reviewed in detail by the Attending Physician   Junious Silk, ANP [facility] Office  [phone] Pager  [phone]  On-Call/Text Page:      [email or web address]      password [email or web address]  If 7PM-7AM, please contact night-coverage [email or web address] Password [facility] 06/12/2012, 11:03 AM   LOS: 2 days   I have examined the patient, reviewed the chart and modified the above note which I agree with.   Adora Yeh,MD [phone] 06/12/2012, 3:39 PM

## 2012-06-13 LAB — CBC
HCT: 35.6 % — ABNORMAL LOW (ref 39.0–52.0)
Hemoglobin: 11.9 g/dL — ABNORMAL LOW (ref 13.0–17.0)
RBC: 3.79 MIL/uL — ABNORMAL LOW (ref 4.22–5.81)
WBC: 17.8 10*3/uL — ABNORMAL HIGH (ref 4.0–10.5)

## 2012-06-13 MED ORDER — RIVAROXABAN 15 MG PO TABS: 15.0000 mg | ORAL_TABLET | Freq: Two times a day (BID) | ORAL | Status: DC

## 2012-06-13 MED ORDER — GUAIFENESIN 100 MG/5ML PO SOLN
200.0000 mg | ORAL | Status: DC | PRN
  Administered 2012-06-13 – 2012-06-15 (×3): 200 mg via ORAL
  Filled 2012-06-13 (×2): qty 118
  Filled 2012-06-13: qty 10

## 2012-06-13 MED ORDER — BISACODYL 10 MG RE SUPP
10.0000 mg | Freq: Once | RECTAL | Status: AC
  Administered 2012-06-13: 10 mg via RECTAL
  Filled 2012-06-13: qty 1

## 2012-06-13 MED ORDER — POLYETHYLENE GLYCOL 3350 17 G PO PACK
17.0000 g | PACK | Freq: Two times a day (BID) | ORAL | Status: DC
  Administered 2012-06-13 – 2012-06-15 (×3): 17 g via ORAL
  Filled 2012-06-13 (×5): qty 1

## 2012-06-13 MED ORDER — RIVAROXABAN 20 MG PO TABS: 20.0000 mg | ORAL_TABLET | Freq: Every day | ORAL | Status: DC

## 2012-06-13 MED ORDER — GUAIFENESIN 100 MG/5ML PO SYRP
200.0000 mg | ORAL_SOLUTION | Freq: Three times a day (TID) | ORAL | Status: DC | PRN
  Filled 2012-06-13: qty 118

## 2012-06-13 MED ORDER — BISACODYL 10 MG RE SUPP: 10.0000 mg | Freq: Once | RECTAL | Status: DC

## 2012-06-13 MED ORDER — SENNOSIDES-DOCUSATE SODIUM 8.6-50 MG PO TABS: 1.0000 | ORAL_TABLET | Freq: Two times a day (BID) | ORAL | Status: DC

## 2012-06-13 MED ORDER — POLYETHYLENE GLYCOL 3350 17 G PO PACK: 17.0000 g | PACK | Freq: Two times a day (BID) | ORAL | Status: DC

## 2012-06-13 NOTE — Progress Notes (Signed)
Patient ID: Jerry Fox, male   DOB: December 05, 1933, 79 y.o.   MRN: [id number]  Patient Name: Jerry Fox      SUBJECTIVE:  Back pain better less dyspnic  Past Medical History  Diagnosis Date  . Arthritis   . Murmur, cardiac   . Hypertension   . Colon polyps   . Hyperlipidemia     PHYSICAL EXAM Filed Vitals:   06/12/12 1141 06/12/12 1300 06/13/12 0448 06/13/12 0500  BP: 150/73 154/78  144/62  Pulse: 89 86  91  Temp: 98.2 F (36.8 C) 98.1 F (36.7 C)  98.1 F (36.7 C)  TempSrc: Oral Oral  Oral  Resp: 15 18  18   Height:      Weight:   202 lb 6.1 oz (91.8 kg)   SpO2: 95% 95%  96%   Well developed and nourished in no acute distress HENT normal Neck supple with JVP 8-10 Clear Regular rate and rhythm, no murmurs or gallops Abd-soft with active BS No Clubbing cyanosis L>R edema Skin-warm and dry A & Oriented  Grossly normal sensory and motor function  TELEMETRY: Reviewed telemetry pt in sinus tach    Intake/Output Summary (Last 24 hours) at 06/13/12 0808 Last data filed at 06/13/12 0744  Gross per 24 hour  Intake  938.5 ml  Output   1575 ml  Net -636.5 ml    LABS: Basic Metabolic Panel:  Recent Labs Lab 06/10/12 1116 06/10/12 2233 06/11/12 0817  NA 135  --  138  K 3.5  --  4.0  CL 94*  --  101  CO2 30  --  27  GLUCOSE 153*  --  117*  BUN 19  --  20  CREATININE 0.89  --  0.79  CALCIUM 9.4  --  8.7  MG  --  2.0  --    Cardiac Enzymes:  Recent Labs  06/10/12 2021 06/11/12 0125 06/11/12 0816  TROPONINI <0.30 <0.30 <0.30   CBC:  Recent Labs Lab 06/10/12 1116 06/11/12 0817 06/12/12 0502 06/13/12 0440  WBC 19.1* 17.2* 24.2* 17.8*  NEUTROABS 15.7*  --   --   --   HGB 13.6 12.1* 12.0* 11.9*  HCT 39.7 36.1* 36.3* 35.6*  MCV 92.1 94.0 94.0 93.9  PLT 208 200 250 226   PROTIME: No results found for this basename: LABPROT, INR,  in the last 72 hours Liver Function Tests:  Recent Labs  06/10/12 1116 06/11/12 0817  AST 17 15  ALT  15 11  ALKPHOS 60 67  BILITOT 0.9 0.8  PROT 7.6 6.9  ALBUMIN 3.7 3.1*   No results found for this basename: LIPASE, AMYLASE,  in the last 72 hours BNP: BNP (last 3 results) No results found for this basename: PROBNP,  in the last 8760 hours D-Dimer: No results found for this basename: DDIMER,  in the last 72 hours Hemoglobin A1C:  Recent Labs  06/10/12 2233  HGBA1C 5.2   Fasting Lipid Panel: No results found for this basename: CHOL, HDL, LDLCALC, TRIG, CHOLHDL, LDLDIRECT,  in the last 72 hours Thyroid Function Tests:  Recent Labs  06/10/12 2233  TSH 0.458     ASSESSMENT AND PLAN:  Principal Problem:   Acute respiratory failure with hypoxia Active Problems:     Saddle pulmonary embolus   New onset atrial fibrillation   Continue anticoagulation pt on heparin  Plan to transition to Rivaroxaban which is reasonable Should start today Would interpret afib as secondary to PE and NOT  its own problem in terms of long term anticoagulation    Will sign off  Signed, Charlton Haws MD  06/13/2012

## 2012-06-13 NOTE — Care Management Note (Signed)
    Page 1 of 1   06/13/2012     4:25:39 PM   CARE MANAGEMENT NOTE 06/13/2012  Patient:  Jerry Fox, Jerry Fox   Account Number:  [id number]  Date Initiated:  06/11/2012  Documentation initiated by:  Alvira Philips Assessment:   80 yr-old male adm with dx of PE; lives with spouse     Action/Plan:   CM REFERRAL FOR XARELTO COVERAGE.   Anticipated DC Date:  06/14/2012   Anticipated DC Plan:  HOME/SELF CARE      DC Planning Services  CM consult  Medication Assistance      Choice offered to / List presented to:             Status of service:  Completed, signed off Medicare Important Message given?   (If response is "NO", the following Medicare IM given date fields will be blank) Date Medicare IM given:   Date Additional Medicare IM given:    Discharge Disposition:  HOME/SELF CARE  Per UR Regulation:  Reviewed for med. necessity/level of care/duration of stay  If discussed at Long Length of Stay Meetings, dates discussed:    Comments:  PCP: Dr Evelena Peat  06/13/12 Lakeith Careaga,RN,BSN [phone] PT MADE AWARE OF COPAY AMT.  HE IS PLEASED WITH THIS.  HIS PHARMACY[facility] IN [place] HAS DOSE IN STOCK. THEY CLOSE TOMORROW AT 12PM.  DR OTI TO SEND RX TO PHARMACY SO THAT PT'S SON CAN PICK UP RX THIS EVENING OR IN AM BEFORE THEY CLOSE, IN CASE HE IS NOT DC'D TOMORROW BEFORE 12.  06/12/12 0744 Henrietta Mayo RN BSN MCN CCM Pt's copay will be $45 for a 30-day supply.  06/11/12 1235 Henrietta Mayo RN BSN MSN CCM Pt may d/c on Xarelto.  CMA will determine copay.

## 2012-06-13 NOTE — Progress Notes (Signed)
SATURATION QUALIFICATIONS: (This note is used to comply with regulatory documentation for home oxygen)  Patient Saturations on Room Air at Rest = 98%  Patient Saturations on Room Air while Ambulating = 91%  Patient Saturations on 2 Liters of oxygen while Ambulating = 96%   Thanks. [facility] [phone] (pager)

## 2012-06-13 NOTE — Progress Notes (Signed)
[facility] PROGRESS NOTE  Jerry Fox WUJ:[id number] DOB: [birth year] DOA: 06/10/2012 PCP: [name], MD  Assessment/Plan: Principal Problem:   Acute respiratory failure with hypoxia Active Problems:   Hyperlipidemia   Hypertension   Saddle pulmonary embolus   New onset atrial fibrillation   AAA (abdominal aortic aneurysm) without rupture/ 3.8 cm   Leukocytosis, unspecified   Pleuritic pain   Acute Pulmonary HTN (46 mmHg)    1. Respiratory failure with hypoxia due to Saddle pulmonary embolus: Patient presented in in fast atrial fibrillation, and chest CT angiogram revealed saddle pulmonary embolus with occlusive components extending into bilateral lower lobe branches, as well as alveolar and airspace opacities throughout much of the right lower lobe, consistent with pulmonary infarcts. 2D Echocardiogram revealed acute Pulmonary HTN (46 mmHg), although no RV strain was seen. He did have pleuritic pain/leukocytosis, which has been managed symptomatically. He has remained apyrexia, and has no sputum production or other features, indicative of infection. He remained hemodynamically stable, was managed with iv Heparin infusion, and oxygen supplementation, with satisfactory clinical response. Currently down to 2 L of O2-continue to attempt to wean O2. Transitioned to Xarelto effective 06/12/12. For PT/OT evaluation.  2. DVT: LE venous doppler demonstrated subacute deep vein thrombosis involving the left femoral vein, left popliteal vein, left posterial tibial vein, and left peroneal vein.Per patient he had LLE swelling for the past one month. This was the source of his PE.  3. New onset atrial fibrillation: This was the initial presenting clinical problem, with patient in fast atrial fibrillation, which fortunately, responded promptly to iv Cardizem infusion. Patient has since remained in SR. Etiology probably his pulmonary emboli. Cardiac enzymes remained un-elevated.  4. AAA  (abdominal aortic aneurysm) without rupture/ 3.8 cm: This was demonstrated on abdominal/pelvic CT scan, which revealed 3.8 cm abdominal aortic aneurysm. Follow up by [place] in 2 years is recommended. Patient will Jerry Fox would benefit from out patient vascular surgical referral. 5. Hyperlipidemia: Continued on Statin  6. Hypertension: Reasonably controlled on beta-blocker.  7. Constipation: Per patient, no bowel movement since 06/10/12. Bowel regimen optimized.    Code Status: Full Code. Family Communication:  Disposition Plan: Aiming discharge on 06/14/12.    Brief narrative: 79 year old male patient with HTN, dyslipidemia, OA, who developed severe pain of the right scapula at 7 PM on the night of admission. He presented to the emergency department and was found to be in new-onset atrial fibrillation. This was associated with hypoxemia and shortness of breath. He was started on IV Cardizem with rapid conversion back to sinus rhythm. He was evaluated by cardiology who recommended a CT angio of the chest, which demonstrated saddle pulmonary emboli. He was subsequently started on IV heparin. EKG was consistent with LVH but no acute ischemic ST abnormalities, he had leukocytosis with a white count of 19,000 and an elevated serum lactate. CT the chest also demonstrated alveolar and airspace opacities throughout the right lower lobe consistent with either pneumonia or pulmonary infarct. He was admitted to step down unit for further management.   Consultants:  Dr [name], cardiologist.   Procedures:  CXR.  CTA  CT Abdomen/Pelvis.   Antibiotics:  N/A.   HPI/Subjective: No chest pain or SOB. Constipated.   Objective: Vital signs in last 24 hours: Temp:  [98.1 F (36.7 C)-98.2 F (36.8 C)] 98.1 F (36.7 C) (02/14 0500) Pulse Rate:  [86-91] 91 (02/14 0500) Resp:  [9-18] 18 (02/14 0500) BP: (113-154)/(61-78) 144/62 mmHg (02/14 0500) SpO2:  [95 %-96 %]  96 % (02/14 0500) Weight:   [91.8 kg (202 lb 6.1 oz)] 91.8 kg (202 lb 6.1 oz) (02/14 0448) Weight change: -1.1 kg (-2 lb 6.8 oz) Last BM Date: 06/10/12  Intake/Output from previous day: 02/13 0701 - 02/14 0700 In: 764.5 [P.O.:480; I.V.:284.5] Out: 1575 [Urine:1575]     Physical Exam: General: Comfortable, alert, communicative, fully oriented, not short of breath at rest.  HEENT:  No clinical pallor, no jaundice, no conjunctival injection or discharge. NECK:  Supple, JVP not seen, no carotid bruits, no palpable lymphadenopathy, no palpable goiter. CHEST:  Few bibasal fine crackles. HEART:  Sounds 1 and 2 heard, normal, regular, no murmurs. ABDOMEN:  Full, soft, non-tender, no palpable organomegaly, no palpable masses, normal bowel sounds. GENITALIA:  Not examined. LOWER EXTREMITIES:  RLE No pitting edema, palpable peripheral pulses. LLE moderately swollen.   MUSCULOSKELETAL SYSTEM:  Generalized osteoarthritic changes, otherwise, normal. CENTRAL NERVOUS SYSTEM:  No focal neurologic deficit on gross examination.  Lab Results:  Recent Labs  06/12/12 0502 06/13/12 0440  WBC 24.2* 17.8*  HGB 12.0* 11.9*  HCT 36.3* 35.6*  PLT 250 226    Recent Labs  06/10/12 1116 06/11/12 0817  NA 135 138  K 3.5 4.0  CL 94* 101  CO2 30 27  GLUCOSE 153* 117*  BUN 19 20  CREATININE 0.89 0.79  CALCIUM 9.4 8.7   Recent Results (from the past 240 hour(s))  URINE CULTURE     Status: None   Collection Time    06/10/12  1:02 PM      Result Value Range Status   Specimen Description URINE, CLEAN CATCH   Final   Special Requests ADD [id number]   Final   Culture  Setup Time 06/10/2012 16:15   Final   Colony Count NO GROWTH   Final   Culture NO GROWTH   Final   Report Status 06/11/2012 FINAL   Final  CULTURE, BLOOD (ROUTINE X 2)     Status: None   Collection Time    06/10/12  5:27 PM      Result Value Range Status   Specimen Description BLOOD HAND RIGHT   Final   Special Requests BOTTLES DRAWN AEROBIC ONLY 5CC    Final   Culture  Setup Time 06/11/2012 00:32   Final   Culture     Final   Value:        BLOOD CULTURE RECEIVED NO GROWTH TO DATE CULTURE WILL BE HELD FOR 5 DAYS BEFORE ISSUING A FINAL NEGATIVE REPORT   Report Status PENDING   Incomplete  CULTURE, BLOOD (ROUTINE X 2)     Status: None   Collection Time    06/10/12  5:35 PM      Result Value Range Status   Specimen Description BLOOD HAND RIGHT   Final   Special Requests BOTTLES DRAWN AEROBIC ONLY 5CC   Final   Culture  Setup Time 06/11/2012 00:32   Final   Culture     Final   Value:        BLOOD CULTURE RECEIVED NO GROWTH TO DATE CULTURE WILL BE HELD FOR 5 DAYS BEFORE ISSUING A FINAL NEGATIVE REPORT   Report Status PENDING   Incomplete  MRSA PCR SCREENING     Status: None   Collection Time    06/10/12  8:02 PM      Result Value Range Status   MRSA by PCR NEGATIVE  NEGATIVE Final   Comment:  The GeneXpert MRSA Assay (FDA     approved for NASAL specimens     only), is one component of a     comprehensive MRSA colonization     surveillance program. It is not     intended to diagnose MRSA     infection nor to guide or     monitor treatment for     MRSA infections.     Studies/Results: No results found.  Medications: Scheduled Meds: . antiseptic oral rinse  15 mL Mouth Rinse BID  . metoprolol tartrate  50 mg Oral BID  . polyethylene glycol  17 g Oral Daily  . rivaroxaban  15 mg Oral BID   Followed by  . [START ON 07/03/2012] rivaroxaban  20 mg Oral Q supper  . senna-docusate  1 tablet Oral BID  . simvastatin  5 mg Oral q1800   Continuous Infusions: . sodium chloride Stopped (06/12/12 1245)   PRN Meds:.acetaminophen, acetaminophen, alum & mag hydroxide-simeth, calcium carbonate, diazepam, hydrALAZINE, levalbuterol, morphine injection, ondansetron (ZOFRAN) IV, ondansetron, oxyCODONE, traMADol    LOS: 3 days   Jerry Fox,CHRISTOPHER  [facility] Pager [phone]. If 8PM-8AM, please contact night-coverage at  [email or web address], password [facility] 06/13/2012, 7:35 AM  LOS: 3 days

## 2012-06-13 NOTE — Evaluation (Signed)
Physical Therapy Evaluation Patient Details Name: Jerry Fox MRN: [id number] DOB: November 13, 1933 Today's Date: 06/13/2012 Time: 1478-2956 PT Time Calculation (min): 22 min  PT Assessment / Plan / Recommendation Clinical Impression  Pt s/p PE with decr mobility secondary to decr endurance and decr balance due to bedrest for a few days.  Recommend pt use RW and then progress to cane for next two weeks.  Pt and wife understand.  Also recommend that pt sit to shower and they have a seat for shower.  Pt and wife decline HHPT and HHOT.  Oxygen level above 90% throughout.      PT Assessment  Patient needs continued PT services    Follow Up Recommendations  Home health PT (safety eval recommended and HHOT but pt declines)                Equipment Recommendations  None recommended by PT         Frequency Min 3X/week    Precautions / Restrictions Precautions Precautions: Fall Restrictions Weight Bearing Restrictions: No   Pertinent Vitals/Pain Sats >90% on RA, No pain      Mobility  Bed Mobility Bed Mobility: Not assessed Transfers Transfers: Sit to Stand;Stand to Sit Sit to Stand: 5: Supervision;With upper extremity assist;With armrests;From chair/3-in-1 Stand to Sit: 5: Supervision;With upper extremity assist;With armrests;To chair/3-in-1 Ambulation/Gait Ambulation/Gait Assistance: 4: Min guard Ambulation Distance (Feet): 200 Feet Assistive device: None Ambulation/Gait Assistance Details: Pt at times staggers with ambulation.  Veers right and left at times especially with distractions.  Educated pt and wife that she should provide guard assist and pt should use RW for one week and then cane x 1 week untill pt has regained his full strength.  also recommended HHPT and HHOt safety evals which pt and wife declined at this time.  Pt sats >90% on RA with ambulation. Gait Pattern: Step-through pattern;Decreased stride length;Decreased step length - left;Decreased step length -  right Gait velocity: decreased Stairs: Yes Stairs Assistance: 4: Min guard Stair Management Technique: One rail Left;Step to pattern;Forwards Number of Stairs: 4 Wheelchair Mobility Wheelchair Mobility: No         PT Diagnosis: Generalized weakness  PT Problem List: Decreased activity tolerance;Decreased balance;Decreased knowledge of precautions;Decreased safety awareness;Decreased knowledge of use of DME;Decreased mobility PT Treatment Interventions: DME instruction;Gait training;Stair training;Functional mobility training;Therapeutic activities;Therapeutic exercise;Balance training;Patient/family education   PT Goals Acute Rehab PT Goals PT Goal Formulation: With patient Time For Goal Achievement: 06/20/12 Potential to Achieve Goals: Good Pt will go Supine/Side to Sit: Independently PT Goal: Supine/Side to Sit - Progress: Goal set today Pt will go Sit to Stand: Independently PT Goal: Sit to Stand - Progress: Goal set today Pt will Ambulate: 51 - 150 feet;with modified independence;with least restrictive assistive device PT Goal: Ambulate - Progress: Goal set today Pt will Go Up / Down Stairs: 3-5 stairs;with modified independence;with least restrictive assistive device PT Goal: Up/Down Stairs - Progress: Goal set today  Visit Information  Last PT Received On: 06/13/12 Assistance Needed: +1    Subjective Data  Subjective: "I am not too unsteady." Patient Stated Goal: To go home   Prior Functioning  Home Living Lives With: Spouse Type of Home: House Home Access: Stairs to enter [employer] of Steps: 4 Entrance Stairs-Rails: Left Home Layout: One level Bathroom Shower/Tub: Tub/shower unit;Walk-in shower Bathroom Toilet: Standard Home Adaptive Equipment: Bedside commode/3-in-1;Walker - rolling;Straight cane Prior Function Level of Independence: Independent Able to Take Stairs?: Yes Driving: Yes Vocation: Retired Special educational needs teacher  Communication: No  difficulties    Cognition  Cognition Overall Cognitive Status: Appears within functional limits for tasks assessed/performed Arousal/Alertness: Awake/alert Orientation Level: Appears intact for tasks assessed Behavior During Session: [facility] for tasks performed    Extremity/Trunk Assessment Right Lower Extremity Assessment RLE ROM/Strength/Tone: [facility] for tasks assessed Left Lower Extremity Assessment LLE ROM/Strength/Tone: [facility] for tasks assessed   Balance Dynamic Standing Balance Dynamic Standing - Balance Support: No upper extremity supported;During functional activity Dynamic Standing - Level of Assistance: 4: Min assist Dynamic Standing - Balance Activities: Lateral lean/weight shifting;Forward lean/weight shifting;Reaching across midline Standardized Balance Assessment Standardized Balance Assessment: Dynamic Gait Index Dynamic Gait Index Level Surface: Normal Change in Gait Speed: Normal Gait with Horizontal Head Turns: Mild Impairment Gait with Vertical Head Turns: Mild Impairment Gait and Pivot Turn: Mild Impairment Step Over Obstacle: Mild Impairment Step Around Obstacles: Normal Steps: Mild Impairment Total Score: 19 High Level Balance High Level Balance Comments: DGI suggests low risk of falls without use of device.  Pt instructed to use device at home.  Instructed wife in how to adjust RW as she previously used it.  End of Session PT - End of Session Equipment Utilized During Treatment: Gait belt Activity Tolerance: Patient tolerated treatment well Patient left: in chair;with call bell/phone within reach;with family/visitor present Nurse Communication: Mobility status       INGOLD,Chessa Barrasso 06/13/2012, 4:57 PM  [facility] Acute Rehabilitation [phone] (pager)

## 2012-06-14 LAB — CBC
HCT: 33.6 % — ABNORMAL LOW (ref 39.0–52.0)
Hemoglobin: 11.2 g/dL — ABNORMAL LOW (ref 13.0–17.0)
MCH: 31.1 pg (ref 26.0–34.0)
MCV: 93.3 fL (ref 78.0–100.0)
Platelets: 216 10*3/uL (ref 150–400)
RBC: 3.6 MIL/uL — ABNORMAL LOW (ref 4.22–5.81)
WBC: 9.8 10*3/uL (ref 4.0–10.5)

## 2012-06-14 LAB — BASIC METABOLIC PANEL
CO2: 30 mEq/L (ref 19–32)
Chloride: 106 mEq/L (ref 96–112)
Potassium: 4.2 mEq/L (ref 3.5–5.1)
Sodium: 141 mEq/L (ref 135–145)

## 2012-06-14 LAB — TROPONIN I
Troponin I: <0.3 ng/mL (ref <0.30)
Troponin I: <0.3 ng/mL (ref <0.30)

## 2012-06-14 LAB — MAGNESIUM: Magnesium: 2.3 mg/dL (ref 1.5–2.5)

## 2012-06-14 MED ORDER — DILTIAZEM LOAD VIA INFUSION: 20.0000 mg | INTRAVENOUS | Status: DC

## 2012-06-14 MED ORDER — DILTIAZEM HCL 25 MG/5ML IV SOLN
20.0000 mg | INTRAVENOUS | Status: AC
  Administered 2012-06-14: 20 mg via INTRAVENOUS
  Filled 2012-06-14: qty 5

## 2012-06-14 MED ORDER — DILTIAZEM HCL 50 MG/10ML IV SOLN
20.0000 mg | INTRAVENOUS | Status: DC
  Filled 2012-06-14: qty 4

## 2012-06-14 MED ORDER — DILTIAZEM HCL 100 MG IV SOLR
5.0000 mg/h | INTRAVENOUS | Status: AC
  Administered 2012-06-14: 5 mg/h via INTRAVENOUS
  Filled 2012-06-14: qty 100

## 2012-06-14 MED ORDER — DILTIAZEM HCL 60 MG PO TABS
60.0000 mg | ORAL_TABLET | Freq: Three times a day (TID) | ORAL | Status: DC
  Administered 2012-06-14 – 2012-06-15 (×3): 60 mg via ORAL
  Filled 2012-06-14 (×6): qty 1

## 2012-06-14 NOTE — Progress Notes (Signed)
[facility] PROGRESS NOTE  Jerry Fox ZOX:[id number] DOB: 02/23/1934 DOA: 06/10/2012 PCP: [name], MD  Assessment/Plan: Principal Problem:   Acute respiratory failure with hypoxia Active Problems:   Hyperlipidemia   Hypertension   Saddle pulmonary embolus   New onset atrial fibrillation   AAA (abdominal aortic aneurysm) without rupture/ 3.8 cm   Leukocytosis, unspecified   Pleuritic pain   Acute Pulmonary HTN (46 mmHg)    1. Respiratory failure with hypoxia due to Saddle pulmonary embolus: Patient presented in in fast atrial fibrillation, and chest CT angiogram revealed saddle pulmonary embolus with occlusive components extending into bilateral lower lobe branches, as well as alveolar and airspace opacities throughout much of the right lower lobe, consistent with pulmonary infarcts. 2D Echocardiogram revealed acute Pulmonary HTN (46 mmHg), although no RV strain was seen. He did have pleuritic pain/leukocytosis, which has been managed symptomatically. He has remained apyrexia, and has no sputum production or other features, indicative of infection. He remained hemodynamically stable, was managed with iv Heparin infusion, and oxygen supplementation, with satisfactory clinical response. Currently down to 2 L of O2-continue to attempt to wean O2. Transitioned to Xarelto effective 06/12/12. Now saturating at 95%-97% on RA.  2. DVT: LE venous doppler demonstrated subacute deep vein thrombosis involving the left femoral vein, left popliteal vein, left posterial tibial vein, and left peroneal vein.Per patient he had LLE swelling for the past one month. This was the source of his PE.  3. New onset atrial fibrillation: This was the initial presenting clinical problem, with patient in fast atrial fibrillation, which fortunately, responded promptly to iv Cardizem infusion. Patient has since remained in SR. Etiology probably his pulmonary emboli. Cardiac enzymes remained un-elevated.  Unfortunately, this AM, patient went int rapid atrial fibrillation, with HR 140s-150s. Asymptomatic, with normal BP. Have bolused Cardizem iv, followed by infusion,. Will cycle cardiac enzymes and re-consult cardiology.  4. AAA (abdominal aortic aneurysm) without rupture/ 3.8 cm: This was demonstrated on abdominal/pelvic CT scan, which revealed 3.8 cm abdominal aortic aneurysm. Follow up by [place] in 2 years is recommended. Patient will [name] would benefit from out patient vascular surgical referral. 5. Hyperlipidemia: Continued on Statin  6. Hypertension: Reasonably controlled on beta-blocker.  7. Constipation: Per patient, no bowel movement since 06/10/12. Bowel regimen optimized. Now resolved.    Code Status: Full Code. Family Communication:  Disposition Plan: Aiming discharge on 06/14/12.    Brief narrative: 79 year old male patient with HTN, dyslipidemia, OA, who developed severe pain of the right scapula at 7 PM on the night of admission. He presented to the emergency department and was found to be in new-onset atrial fibrillation. This was associated with hypoxemia and shortness of breath. He was started on IV Cardizem with rapid conversion back to sinus rhythm. He was evaluated by cardiology who recommended a CT angio of the chest, which demonstrated saddle pulmonary emboli. He was subsequently started on IV heparin. EKG was consistent with LVH but no acute ischemic ST abnormalities, he had leukocytosis with a white count of 19,000 and an elevated serum lactate. CT the chest also demonstrated alveolar and airspace opacities throughout the right lower lobe consistent with either pneumonia or pulmonary infarct. He was admitted to step down unit for further management.   Consultants:  Dr [name], cardiologist.   Procedures:  CXR.  CTA  CT Abdomen/Pelvis.   Antibiotics:  N/A.   HPI/Subjective: No chest pain or SOB. Moved bowels yesterday and this AM.   Objective: Vital  signs in  last 24 hours: Temp:  [98.1 F (36.7 C)-98.7 F (37.1 C)] 98.7 F (37.1 C) (02/15 0520) Pulse Rate:  [83-132] 132 (02/15 1019) Resp:  [18] 18 (02/15 0520) BP: (146-160)/(70-90) 146/70 mmHg (02/15 0520) SpO2:  [94 %-99 %] 95 % (02/15 0520) Weight change:  Last BM Date: 06/13/12  Intake/Output from previous day: 02/14 0701 - 02/15 0700 In: 720 [P.O.:720] Out: -      Physical Exam: General: Comfortable, alert, communicative, fully oriented, not short of breath at rest.  HEENT:  No clinical pallor, no jaundice, no conjunctival injection or discharge. NECK:  Supple, JVP not seen, no carotid bruits, no palpable lymphadenopathy, no palpable goiter. CHEST:  Clear to auscultation. HEART:  Sounds 1 and 2 heard, normal, regular, no murmurs. ABDOMEN:  Full, soft, non-tender, no palpable organomegaly, no palpable masses, normal bowel sounds. GENITALIA:  Not examined. LOWER EXTREMITIES:  RLE No pitting edema, palpable peripheral pulses. LLE moderately swollen.   MUSCULOSKELETAL SYSTEM:  Generalized osteoarthritic changes, otherwise, normal. CENTRAL NERVOUS SYSTEM:  No focal neurologic deficit on gross examination.  Lab Results:  Recent Labs  06/13/12 0440 06/14/12 0500  WBC 17.8* 9.8  HGB 11.9* 11.2*  HCT 35.6* 33.6*  PLT 226 216    Recent Labs  06/14/12 0500  NA 141  K 4.2  CL 106  CO2 30  GLUCOSE 108*  BUN 19  CREATININE 0.78  CALCIUM 8.6   Recent Results (from the past 240 hour(s))  URINE CULTURE     Status: None   Collection Time    06/10/12  1:02 PM      Result Value Range Status   Specimen Description URINE, CLEAN CATCH   Final   Special Requests ADD [phone]   Final   Culture  Setup Time 06/10/2012 16:15   Final   Colony Count NO GROWTH   Final   Culture NO GROWTH   Final   Report Status 06/11/2012 FINAL   Final  CULTURE, BLOOD (ROUTINE X 2)     Status: None   Collection Time    06/10/12  5:27 PM      Result Value Range Status   Specimen  Description BLOOD HAND RIGHT   Final   Special Requests BOTTLES DRAWN AEROBIC ONLY 5CC   Final   Culture  Setup Time 06/11/2012 00:32   Final   Culture     Final   Value:        BLOOD CULTURE RECEIVED NO GROWTH TO DATE CULTURE WILL BE HELD FOR 5 DAYS BEFORE ISSUING A FINAL NEGATIVE REPORT   Report Status PENDING   Incomplete  CULTURE, BLOOD (ROUTINE X 2)     Status: None   Collection Time    06/10/12  5:35 PM      Result Value Range Status   Specimen Description BLOOD HAND RIGHT   Final   Special Requests BOTTLES DRAWN AEROBIC ONLY 5CC   Final   Culture  Setup Time 06/11/2012 00:32   Final   Culture     Final   Value:        BLOOD CULTURE RECEIVED NO GROWTH TO DATE CULTURE WILL BE HELD FOR 5 DAYS BEFORE ISSUING A FINAL NEGATIVE REPORT   Report Status PENDING   Incomplete  MRSA PCR SCREENING     Status: None   Collection Time    06/10/12  8:02 PM      Result Value Range Status   MRSA by PCR NEGATIVE  NEGATIVE Final  Comment:            The GeneXpert MRSA Assay (FDA     approved for NASAL specimens     only), is one component of a     comprehensive MRSA colonization     surveillance program. It is not     intended to diagnose MRSA     infection nor to guide or     monitor treatment for     MRSA infections.     Studies/Results: No results found.  Medications: Scheduled Meds: . antiseptic oral rinse  15 mL Mouth Rinse BID  . metoprolol tartrate  50 mg Oral BID  . polyethylene glycol  17 g Oral BID  . rivaroxaban  15 mg Oral BID   Followed by  . [START ON 07/03/2012] rivaroxaban  20 mg Oral Q supper  . senna-docusate  1 tablet Oral BID  . simvastatin  5 mg Oral q1800   Continuous Infusions: . sodium chloride Stopped (06/12/12 1245)  . diltiazem (CARDIZEM) infusion 5 mg/hr (06/14/12 1009)   PRN Meds:.acetaminophen, acetaminophen, alum & mag hydroxide-simeth, calcium carbonate, diazepam, guaiFENesin, hydrALAZINE, levalbuterol, morphine injection, ondansetron (ZOFRAN) IV,  ondansetron, oxyCODONE, traMADol    LOS: 4 days   Janee Ureste,CHRISTOPHER  [facility] Pager [phone]. If 8PM-8AM, please contact night-coverage at [email or web address], password [facility] 06/14/2012, 10:27 AM  LOS: 4 days

## 2012-06-14 NOTE — Progress Notes (Signed)
PROGRESS NOTE  Subjective:   Jerry Fox is a 79 yo who was admitted to the hospital with a saddle pulmonary embolus and new onset atrial fib.  He converted back to NSR initially  but developed recurrent atrial fibrillation this morning.    He cannot tell if he is in atrial fib or not - he is completely asymptomatic.    His HR has decreased in the cardiazem drip.   Objective:    Vital Signs:   Temp:  [98.1 F (36.7 C)-98.7 F (37.1 C)] 98.7 F (37.1 C) (02/15 0520) Pulse Rate:  [83-132] 132 (02/15 1019) Resp:  [18] 18 (02/15 0520) BP: (146-153)/(70-90) 146/70 mmHg (02/15 0520) SpO2:  [94 %-99 %] 95 % (02/15 0520)  Last BM Date: 06/13/12   24-hour weight change: Weight change:   Weight trends: Filed Weights   06/11/12 0440 06/12/12 0451 06/13/12 0448  Weight: 200 lb 9.9 oz (91 kg) 204 lb 12.9 oz (92.9 kg) 202 lb 6.1 oz (91.8 kg)    Intake/Output:  02/14 0701 - 02/15 0700 In: 720 [P.O.:720] Out: -  Total I/O In: 240 [P.O.:240] Out: 400 [Urine:400]   Physical Exam: BP 146/70  Pulse 132  Temp(Src) 98.7 F (37.1 C) (Oral)  Resp 18  Ht 5\' 11"  (1.803 m)  Wt 202 lb 6.1 oz (91.8 kg)  BMI 28.24 kg/m2  SpO2 95%  General: Vital signs reviewed and noted.   Head: Normocephalic, atraumatic.  Eyes: conjunctivae/corneas clear.  EOM's intact.   Throat: normal  Neck:  normal  Lungs:    clear  Heart:  Irregularly irregular  Abdomen:  Soft, non-tender, non-distended    Extremities: Mild edema of left lower leg   Neurologic: A&O X3, CN II - XII are grossly intact.   Psych: Normal     Labs: BMET:  Recent Labs  06/14/12 0500 06/14/12 1045  NA 141  --   K 4.2  --   CL 106  --   CO2 30  --   GLUCOSE 108*  --   BUN 19  --   CREATININE 0.78  --   CALCIUM 8.6  --   MG  --  2.3    Liver function tests: No results found for this basename: AST, ALT, ALKPHOS, BILITOT, PROT, ALBUMIN,  in the last 72 hours No results found for this basename: LIPASE, AMYLASE,  in  the last 72 hours  CBC:  Recent Labs  06/13/12 0440 06/14/12 0500  WBC 17.8* 9.8  HGB 11.9* 11.2*  HCT 35.6* 33.6*  MCV 93.9 93.3  PLT 226 216    Cardiac Enzymes:  Recent Labs  06/14/12 1045  TROPONINI <0.30    Coagulation Studies: No results found for this basename: LABPROT, INR,  in the last 72 hours  Other:   Other results:  Tele:  Atrial fib with controlled ventricular response  Medications:    Infusions: . sodium chloride Stopped (06/12/12 1245)  . diltiazem (CARDIZEM) infusion 5 mg/hr (06/14/12 1009)    Scheduled Medications: . antiseptic oral rinse  15 mL Mouth Rinse BID  . metoprolol tartrate  50 mg Oral BID  . polyethylene glycol  17 g Oral BID  . rivaroxaban  15 mg Oral BID   Followed by  . [START ON 07/03/2012] rivaroxaban  20 mg Oral Q supper  . senna-docusate  1 tablet Oral BID  . simvastatin  5 mg Oral q1800    Assessment/ Plan:    1. Atrial fibrillation:  He went  back into A-Fib and we were asked to come back to see patient.  He is completely asymptomatic.   His rate is better - Dilt 5 mg / hour qqt.  Hjis Metoprolol has been increased.  Will change to PO dilt.  Titrate Dilt drip to off as tolerated.     Hyperlipidemia   Hypertension   Saddle pulmonary embolus   AAA (abdominal aortic aneurysm) without rupture/ 3.8 cm   Leukocytosis, unspecified   Pleuritic pain   Acute Pulmonary HTN (46 mmHg)   Disposition:  Length of Stay: 4  Jerry Fox, Jerry Fox., MD, [facility] 06/14/2012, 12:52 PM Office [phone] Pager [phone]

## 2012-06-14 NOTE — Progress Notes (Addendum)
Paged Dr. Elease Hashimoto regarding Cardizem drip and Cardizem 60 mg PO since patient has converted to NSR HR 76. Dr. Elease Hashimoto advised me to turn off Cardizem drip and give patient 60 mg tablet at 1400. Lajuana Matte, RN

## 2012-06-15 LAB — CBC
Platelets: 252 10*3/uL (ref 150–400)
RDW: 12.5 % (ref 11.5–15.5)
WBC: 9.5 10*3/uL (ref 4.0–10.5)

## 2012-06-15 LAB — BASIC METABOLIC PANEL
Chloride: 103 mEq/L (ref 96–112)
Creatinine, Ser: 0.77 mg/dL (ref 0.50–1.35)
GFR calc Af Amer: >90 mL/min (ref >90)
Potassium: 3.7 mEq/L (ref 3.5–5.1)

## 2012-06-15 MED ORDER — BENZONATATE 100 MG PO CAPS
200.0000 mg | ORAL_CAPSULE | Freq: Three times a day (TID) | ORAL | Status: DC
  Administered 2012-06-15: 200 mg via ORAL
  Filled 2012-06-15 (×3): qty 2

## 2012-06-15 MED ORDER — BENZONATATE 200 MG PO CAPS: 200.0000 mg | ORAL_CAPSULE | Freq: Three times a day (TID) | ORAL | Status: DC

## 2012-06-15 MED ORDER — METOPROLOL TARTRATE 50 MG PO TABS: 50.0000 mg | ORAL_TABLET | Freq: Two times a day (BID) | ORAL | Status: DC

## 2012-06-15 MED ORDER — DILTIAZEM HCL ER COATED BEADS 180 MG PO CP24
180.0000 mg | ORAL_CAPSULE | Freq: Every day | ORAL | Status: DC
  Administered 2012-06-15: 180 mg via ORAL
  Filled 2012-06-15: qty 1

## 2012-06-15 MED ORDER — DILTIAZEM HCL ER COATED BEADS 180 MG PO CP24: 180.0000 mg | ORAL_CAPSULE | Freq: Every day | ORAL | Status: DC

## 2012-06-15 NOTE — Discharge Summary (Signed)
Physician Discharge Summary  Jerry Fox:[id number] DOB: 09-20-1933 DOA: 06/10/2012  PCP: [name], MD  Admit date: 06/10/2012 Discharge date: 06/15/2012  Time spent: 40 minutes  Recommendations for Outpatient Follow-up:  1. Follow up with PMD. 2. Follow up with Dr [name], cardiologist. 3. Primary MD to arrange referral to vascular surgeon for follow up of abdominal aortic aneurysm.   Discharge Diagnoses:  Principal Problem:   Acute respiratory failure with hypoxia Active Problems:   Hyperlipidemia   Hypertension   Saddle pulmonary embolus   New onset atrial fibrillation   AAA (abdominal aortic aneurysm) without rupture/ 3.8 cm   Leukocytosis, unspecified   Pleuritic pain   Acute Pulmonary HTN (46 mmHg)   Discharge Condition: Satisfactory.  Diet recommendation: Heart-Healthy  Filed Weights   06/12/12 0451 06/13/12 0448 06/15/12 0508  Weight: 92.9 kg (204 lb 12.9 oz) 91.8 kg (202 lb 6.1 oz) 88.905 kg (196 lb)    History of present illness:  79 year old male patient with HTN, dyslipidemia, OA, who developed severe pain of the right scapula at 7 PM on the night of admission. He presented to the emergency department and was found to be in new-onset atrial fibrillation. This was associated with hypoxemia and shortness of breath. He was started on IV Cardizem with rapid conversion back to sinus rhythm. He was evaluated by cardiology who recommended a CT angio of the chest, which demonstrated saddle pulmonary emboli. He was subsequently started on IV heparin. EKG was consistent with LVH but no acute ischemic ST abnormalities, he had leukocytosis with a white count of 19,000 and an elevated serum lactate. CT the chest also demonstrated alveolar and airspace opacities throughout the right lower lobe consistent with either pneumonia or pulmonary infarct. He was admitted to step down unit for further management.   Hospital Course:  1. Respiratory failure with  hypoxia due to Saddle pulmonary embolus: Patient presented in in fast atrial fibrillation, and chest CT angiogram revealed saddle pulmonary embolus with occlusive components extending into bilateral lower lobe branches, as well as alveolar and airspace opacities throughout much of the right lower lobe, consistent with pulmonary infarcts. 2D Echocardiogram revealed acute Pulmonary HTN (46 mmHg), although no RV strain was seen. He did have pleuritic pain/leukocytosis, which has been managed symptomatically. He has remained apyrexial, and has no purulent sputum production or other features, indicative of infection. He remained hemodynamically stable, was managed with iv Heparin infusion, and oxygen supplementation, with satisfactory clinical response. O2 was successfully weaned, and he is now saturating at 95%-97% on RA. Transitioned to Xarelto effective 06/12/12.  2. DVT: LE venous doppler demonstrated subacute deep vein thrombosis involving the left femoral vein, left popliteal vein, left posterial tibial vein, and left peroneal vein.Per patient he had LLE swelling for the past one month. This was the source of his PE.  3. New onset atrial fibrillation: This was the initial presenting clinical problem, with patient in fast atrial fibrillation, which fortunately, responded promptly to iv Cardizem infusion. Patient has since remained in SR. Etiology probably his pulmonary emboli. Cardiac enzymes remained un-elevated. Unfortunately, in AM of 06/14/12, patient went into rapid atrial fibrillation, with HR 140s-150s, although he was asymptomatic, with normal BP. Controlled with iv Cardizem bolus/infusion, and successfully transitioned to oral Cardizem and beta-blocker, on recommendation of Dr [name], who provided a consultation. Cardiac enzymes remained unelevated. Patient is back in SR.  4. AAA (abdominal aortic aneurysm) without rupture/ 3.8 cm: This was demonstrated on abdominal/pelvic CT scan, which revealed  3.8 cm abdominal aortic aneurysm. Follow up by [place] in 2 years is recommended. Patient will Jerry Fox would benefit from out-patient vascular surgical referral. We have deferred to his PMD to arrange this.  5. Hyperlipidemia: Continued on Statin  6. Hypertension: Reasonably controlled on beta-blocker.  7. Constipation: As of 06/13/12, patient, no bowel movement since 06/10/12. Bowel regimen optimized. Now resolved.    Procedures:  See Below.   2D Echocardiogram.   Consultations: Dr [name], cardiologist.    Discharge Exam: Filed Vitals:   06/14/12 1019 06/14/12 1338 06/14/12 1938 06/15/12 0508  BP:  116/78 129/62 143/59  Pulse: 132 72 75 75  Temp:  98.2 F (36.8 C) 98.1 F (36.7 C) 98.7 F (37.1 C)  TempSrc:  Oral Oral Oral  Resp:  16 18 20   Height:      Weight:    88.905 kg (196 lb)  SpO2:  97% 98% 96%    General: Comfortable, alert, communicative, fully oriented, not short of breath at rest.  HEENT: No clinical pallor, no jaundice, no conjunctival injection or discharge.  NECK: Supple, JVP not seen, no carotid bruits, no palpable lymphadenopathy, no palpable goiter.  CHEST: Clear to auscultation.  HEART: Sounds 1 and 2 heard, normal, regular, no murmurs.  ABDOMEN: Full, soft, non-tender, no palpable organomegaly, no palpable masses, normal bowel sounds.  GENITALIA: Not examined.  LOWER EXTREMITIES: RLE No pitting edema, palpable peripheral pulses. LLE moderately swollen.  MUSCULOSKELETAL SYSTEM: Generalized osteoarthritic changes, otherwise, normal.  CENTRAL NERVOUS SYSTEM: No focal neurologic deficit on gross examination.  Discharge Instructions      Discharge Orders   Future Appointments Provider Department Dept Phone   11/20/2012 11:00 AM [name], MD [facility] [phone]   Future Orders Complete By Expires     Diet - low sodium heart healthy  As directed     Increase activity slowly  As directed         Medication List     STOP taking these medications       aspirin 325 MG tablet     hydrochlorothiazide 25 MG tablet  Commonly known as:  HYDRODIURIL     lisinopril 10 MG tablet  Commonly known as:  PRINIVIL,ZESTRIL      TAKE these medications       benzonatate 200 MG capsule  Commonly known as:  TESSALON  Take 1 capsule (200 mg total) by mouth 3 (three) times daily.     diazepam 5 MG tablet  Commonly known as:  VALIUM  Take 2.5-5 mg by mouth 3 (three) times daily as needed (for anxiety; 2.5 mg every morning and at noon and 5 mg at bedtime).     diltiazem 180 MG 24 hr capsule  Commonly known as:  CARDIZEM CD  Take 1 capsule (180 mg total) by mouth daily.     HYDROcodone-acetaminophen 5-325 MG per tablet  Commonly known as:  NORCO/VICODIN  Take 1 tablet by mouth 3 (three) times daily as needed for pain (for pain).     lovastatin 20 MG tablet  Commonly known as:  MEVACOR  Take 1 tablet (20 mg total) by mouth at bedtime.     metoprolol 50 MG tablet  Commonly known as:  LOPRESSOR  Take 1 tablet (50 mg total) by mouth 2 (two) times daily.     polyethylene glycol packet  Commonly known as:  MIRALAX / GLYCOLAX  Take 17 g by mouth 2 (two) times daily.  Rivaroxaban 15 MG Tabs tablet  Commonly known as:  XARELTO  Take 1 tablet (15 mg total) by mouth 2 (two) times daily.     Rivaroxaban 20 MG Tabs  Commonly known as:  XARELTO  Take 1 tablet (20 mg total) by mouth daily with supper.  Start taking on:  07/03/2012     senna-docusate 8.6-50 MG per tablet  Commonly known as:  Senokot-S  Take 1 tablet by mouth 2 (two) times daily.     traMADol 50 MG tablet  Commonly known as:  ULTRAM  Take 50-100 mg by mouth every 6 (six) hours as needed for pain (for pain).       Follow-up Information   Schedule an appointment as soon as possible for a visit with [name], MD.   Contact information:   [address] [place] [address] [phone]       Schedule an appointment  as soon as possible for a visit with [name], MD.   Contact information:   [address] [place] [address] [phone]        The results of significant diagnostics from this hospitalization (including imaging, microbiology, ancillary and laboratory) are listed below for reference.    Significant Diagnostic Studies: Ct Angio Chest W/cm &/or Wo Cm  06/10/2012  *RADIOLOGY REPORT*  Clinical Data:  Right shoulder pain, shortness of breath, flank pain.  CT ANGIOGRAPHY CHEST CT ABDOMEN AND PELVIS WITH CONTRAST  Technique:  Multidetector CT imaging of the chest was performed using the standard protocol during bolus administration of intravenous contrast.  Multiplanar CT image reconstructions including MIPs were obtained to evaluate the vascular anatomy. Multidetector CT imaging of the abdomen and pelvis was performed using the standard protocol during bolus administration of intravenous contrast.  Contrast: 80mL OMNIPAQUE IOHEXOL 350 MG/ML SOLN  Comparison:   None.  CTA CHEST  Findings:  There is a saddle embolus across the bifurcation of the main pulmonary artery, nearly occlusive at it extent into bilateral lower lobe branches. Adequate contrast opacification of the thoracic aorta with no evidence of dissection, aneurysm, or stenosis. There is classic 3-vessel brachiocephalic arch anatomy. Patchy coronary and aortic calcifications.  No pleural or pericardial effusion.  Sub centimeter precarinal and subcarinal lymph nodes.  No definite hilar adenopathy.  Extensive ground-glass opacities throughout much of the right lower lobe with some patchy interspersed airspace disease.  There is mild dependent atelectasis posteriorly in the left lower lobe.  Linear scarring or subsegmental atelectasis in the lingula and right middle lobe. Regional bones unremarkable.   Review of the MIP images confirms the above findings.  IMPRESSION:  1.  Saddle pulmonary embolus with occlusive components  extending into bilateral lower lobe branches. 2.  Alveolar and airspace opacities throughout   much of the right lower lobe, differential diagnosis pulmonary infarct, pneumonia, other inflammatory or infiltrative processes.  I telephoned the critical test results to Dr. Silverio Lay  at the time of interpretation.  CT ABDOMEN AND PELVIS  Findings: Scattered atheromatous plaque in the abdominal aorta with a fusiform 3.8 cm infrarenal aneurysm.  No dissection or stenosis. No retroperitoneal hemorrhage.  Small hiatal hernia.  Stomach, small bowel, and colon are nondilated.  No ascites.  No free air.  Unremarkable liver, gallbladder, spleen, adrenal glands, kidneys.  Portal vein patent. Spondylitic changes in the lower lumbar spine. Urinary bladder physiologically distended.  Right inguinal hernia containing only mesenteric fat.  No adenopathy localized.  Review of the MIP images confirms  the above findings.  IMPRESSION:  1.  No acute abdominal process. 2.  3.8 cm abdominal aortic aneurysm. Recommend followup by [place] in 2 years.  This recommendation follows ACR consensus guidelines: White Paper of the ACR Incidental Findings Committee II on Vascular Findings.  J Am Coll Radiol 2013; 10:789-794. 3.  Small hiatal hernia.   Original Report Authenticated By: D. Andria Rhein, MD    US Abdomen Complete  06/10/2012  *RADIOLOGY REPORT*  Clinical Data:  Abdominal pain, back pain  COMPLETE ABDOMINAL ULTRASOUND  Comparison:  Lumbar MRI 08/03/2008.  Findings: Study is limited by abundant bowel gas and patient's large body habitus.  No sonographic Murphy's sign.  Gallbladder:  No gallstones, gallbladder wall thickening, or pericholecystic fluid.  Common bile duct:  Measures 4.8 mm in diameter.  Liver:  No focal lesion identified.  Within normal limits in parenchymal echogenicity.  IVC: Not visualized due to abundant bowel gas  Pancreas:  Not visualized  Spleen:  Measures 5.7 cm in length.  Normal echogenicity.  Right Kidney:  Measures 12.4  cm in length.  No mass, hydronephrosis or diagnostic renal calculus  Left Kidney:  Measures 12.3 cm in length.  No mass, hydronephrosis or diagnostic renal calculus.  Abdominal aorta: There is aneurysm of the abdominal aorta measures 4.3 x 3.4 cm extending 3.8 cm cranial caudally.  IMPRESSION:  1.  No gallstones are noted within gallbladder.  Normal CBD. 2.  No hydronephrosis or diagnostic renal calculus. 3.  Aneurysm of the abdominal aorta measures 4.3 x 3.4 cm.   Original Report Authenticated By: Natasha Mead, M.D.    Ct Abdomen Pelvis W Contrast  06/10/2012  *RADIOLOGY REPORT*  Clinical Data:  Right shoulder pain, shortness of breath, flank pain.  CT ANGIOGRAPHY CHEST CT ABDOMEN AND PELVIS WITH CONTRAST  Technique:  Multidetector CT imaging of the chest was performed using the standard protocol during bolus administration of intravenous contrast.  Multiplanar CT image reconstructions including MIPs were obtained to evaluate the vascular anatomy. Multidetector CT imaging of the abdomen and pelvis was performed using the standard protocol during bolus administration of intravenous contrast.  Contrast: 80mL OMNIPAQUE IOHEXOL 350 MG/ML SOLN  Comparison:   None.  CTA CHEST  Findings:  There is a saddle embolus across the bifurcation of the main pulmonary artery, nearly occlusive at it extent into bilateral lower lobe branches. Adequate contrast opacification of the thoracic aorta with no evidence of dissection, aneurysm, or stenosis. There is classic 3-vessel brachiocephalic arch anatomy. Patchy coronary and aortic calcifications.  No pleural or pericardial effusion.  Sub centimeter precarinal and subcarinal lymph nodes.  No definite hilar adenopathy.  Extensive ground-glass opacities throughout much of the right lower lobe with some patchy interspersed airspace disease.  There is mild dependent atelectasis posteriorly in the left lower lobe.  Linear scarring or subsegmental atelectasis in the lingula and right middle  lobe. Regional bones unremarkable.   Review of the MIP images confirms the above findings.  IMPRESSION:  1.  Saddle pulmonary embolus with occlusive components extending into bilateral lower lobe branches. 2.  Alveolar and airspace opacities throughout   much of the right lower lobe, differential diagnosis pulmonary infarct, pneumonia, other inflammatory or infiltrative processes.  I telephoned the critical test results to Dr. Silverio Lay  at the time of interpretation.  CT ABDOMEN AND PELVIS  Findings: Scattered atheromatous plaque in the abdominal aorta with a fusiform 3.8 cm infrarenal aneurysm.  No dissection or stenosis. No retroperitoneal hemorrhage.  Small hiatal hernia.  Stomach, small bowel, and colon are nondilated.  No ascites.  No free air.  Unremarkable liver, gallbladder, spleen, adrenal glands, kidneys.  Portal vein patent. Spondylitic changes in the lower lumbar spine. Urinary bladder physiologically distended.  Right inguinal hernia containing only mesenteric fat.  No adenopathy localized.  Review of the MIP images confirms the above findings.  IMPRESSION:  1.  No acute abdominal process. 2.  3.8 cm abdominal aortic aneurysm. Recommend followup by [place] in 2 years.  This recommendation follows ACR consensus guidelines: White Paper of the ACR Incidental Findings Committee II on Vascular Findings.  J Am Coll Radiol 2013; 10:789-794. 3.  Small hiatal hernia.   Original Report Authenticated By: D. Andria Rhein, MD    Dg Chest Port 1 View  06/11/2012  *RADIOLOGY REPORT*  Clinical Data: Acute onset of shortness of breath and wheezing.  PORTABLE CHEST - 1 VIEW  Comparison: Chest radiograph and CT of the chest performed 06/10/2012  Findings: The lungs are hypoexpanded.  Mild right basilar airspace opacity may reflect pulmonary infarct, given its appearance on CT. No definite pleural effusion or pneumothorax is seen.  The cardiomediastinal silhouette is borderline normal in size.  No acute osseous abnormalities are  identified.  IMPRESSION: Lungs hypoexpanded; mild right basilar airspace opacity may reflect pulmonary infarct, given its appearance on recent CT.  No significant change from the prior study.   Original Report Authenticated By: Tonia Ghent, M.D.    [facility] 1 View  06/10/2012  *RADIOLOGY REPORT*  Clinical Data: Back pain.  PORTABLE CHEST - 1 VIEW  Comparison: None.  Findings: Low lung volumes with bibasilar atelectasis.  Heart size is accentuated by the low volumes.  No acute bony abnormality.  IMPRESSION: Low lung volumes with bibasilar opacities, likely atelectasis.   Original Report Authenticated By: Charlett Nose, M.D.     Microbiology: Recent Results (from the past 240 hour(s))  URINE CULTURE     Status: None   Collection Time    06/10/12  1:02 PM      Result Value Range Status   Specimen Description URINE, CLEAN CATCH   Final   Special Requests ADD [id number]   Final   Culture  Setup Time 06/10/2012 16:15   Final   Colony Count NO GROWTH   Final   Culture NO GROWTH   Final   Report Status 06/11/2012 FINAL   Final  CULTURE, BLOOD (ROUTINE X 2)     Status: None   Collection Time    06/10/12  5:27 PM      Result Value Range Status   Specimen Description BLOOD HAND RIGHT   Final   Special Requests BOTTLES DRAWN AEROBIC ONLY 5CC   Final   Culture  Setup Time 06/11/2012 00:32   Final   Culture     Final   Value:        BLOOD CULTURE RECEIVED NO GROWTH TO DATE CULTURE WILL BE HELD FOR 5 DAYS BEFORE ISSUING A FINAL NEGATIVE REPORT   Report Status PENDING   Incomplete  CULTURE, BLOOD (ROUTINE X 2)     Status: None   Collection Time    06/10/12  5:35 PM      Result Value Range Status   Specimen Description BLOOD HAND RIGHT   Final   Special Requests BOTTLES DRAWN AEROBIC ONLY 5CC   Final   Culture  Setup Time 06/11/2012 00:32   Final   Culture     Final   Value:  BLOOD CULTURE RECEIVED NO GROWTH TO DATE CULTURE WILL BE HELD FOR 5 DAYS BEFORE ISSUING A FINAL NEGATIVE REPORT    Report Status PENDING   Incomplete  MRSA PCR SCREENING     Status: None   Collection Time    06/10/12  8:02 PM      Result Value Range Status   MRSA by PCR NEGATIVE  NEGATIVE Final   Comment:            The GeneXpert MRSA Assay (FDA     approved for NASAL specimens     only), is one component of a     comprehensive MRSA colonization     surveillance program. It is not     intended to diagnose MRSA     infection nor to guide or     monitor treatment for     MRSA infections.     Labs: Basic Metabolic Panel:  Recent Labs Lab 06/10/12 1116 06/10/12 2233 06/11/12 0817 06/14/12 0500 06/14/12 1045 06/15/12 0538  NA 135  --  138 141  --  138  K 3.5  --  4.0 4.2  --  3.7  CL 94*  --  101 106  --  103  CO2 30  --  27 30  --  28  GLUCOSE 153*  --  117* 108*  --  108*  BUN 19  --  20 19  --  17  CREATININE 0.89  --  0.79 0.78  --  0.77  CALCIUM 9.4  --  8.7 8.6  --  8.9  MG  --  2.0  --   --  2.3  --    Liver Function Tests:  Recent Labs Lab 06/10/12 1116 06/11/12 0817  AST 17 15  ALT 15 11  ALKPHOS 60 67  BILITOT 0.9 0.8  PROT 7.6 6.9  ALBUMIN 3.7 3.1*   No results found for this basename: LIPASE, AMYLASE,  in the last 168 hours No results found for this basename: AMMONIA,  in the last 168 hours CBC:  Recent Labs Lab 06/10/12 1116 06/11/12 0817 06/12/12 0502 06/13/12 0440 06/14/12 0500 06/15/12 0538  WBC 19.1* 17.2* 24.2* 17.8* 9.8 9.5  NEUTROABS 15.7*  --   --   --   --   --   HGB 13.6 12.1* 12.0* 11.9* 11.2* 11.3*  HCT 39.7 36.1* 36.3* 35.6* 33.6* 34.7*  MCV 92.1 94.0 94.0 93.9 93.3 92.8  PLT 208 200 250 226 216 252   Cardiac Enzymes:  Recent Labs Lab 06/11/12 0125 06/11/12 0816 06/14/12 1045 06/14/12 1425 06/14/12 2206  TROPONINI <0.30 <0.30 <0.30 <0.30 <0.30   BNP: BNP (last 3 results) No results found for this basename: PROBNP,  in the last 8760 hours CBG:  Recent Labs Lab 06/10/12 2340  GLUCAP 120*        Signed:  Escarlet Saathoff,CHRISTOPHER  [facility] 06/15/2012, 12:50 PM

## 2012-06-15 NOTE — Progress Notes (Signed)
Patient ID: Jerry Fox, male   DOB: 1933/06/21, 79 y.o.   MRN: [id number]   SUBJECTIVE:  Patient feels good today. Yesterday he was started on oral Cardizem.  His rhythm has reverted back to normal sinus. When he had some atrial fib yesterday he did not know he was in atrial fib and he had no significant symptoms.   Filed Vitals:   06/14/12 1019 06/14/12 1338 06/14/12 1938 06/15/12 0508  BP:  116/78 129/62 143/59  Pulse: 132 72 75 75  Temp:  98.2 F (36.8 C) 98.1 F (36.7 C) 98.7 F (37.1 C)  TempSrc:  Oral Oral Oral  Resp:  16 18 20   Height:      Weight:    196 lb (88.905 kg)  SpO2:  97% 98% 96%    Intake/Output Summary (Last 24 hours) at 06/15/12 0825 Last data filed at 06/14/12 1342  Gross per 24 hour  Intake    360 ml  Output    400 ml  Net    -40 ml    LABS: Basic Metabolic Panel:  Recent Labs  40/98/11 0500 06/14/12 1045 06/15/12 0538  NA 141  --  138  K 4.2  --  3.7  CL 106  --  103  CO2 30  --  28  GLUCOSE 108*  --  108*  BUN 19  --  17  CREATININE 0.78  --  0.77  CALCIUM 8.6  --  8.9  MG  --  2.3  --    Liver Function Tests: No results found for this basename: AST, ALT, ALKPHOS, BILITOT, PROT, ALBUMIN,  in the last 72 hours No results found for this basename: LIPASE, AMYLASE,  in the last 72 hours CBC:  Recent Labs  06/14/12 0500 06/15/12 0538  WBC 9.8 9.5  HGB 11.2* 11.3*  HCT 33.6* 34.7*  MCV 93.3 92.8  PLT 216 252   Cardiac Enzymes:  Recent Labs  06/14/12 1045 06/14/12 1425 06/14/12 2206  TROPONINI <0.30 <0.30 <0.30   BNP: No components found with this basename: POCBNP,  D-Dimer: No results found for this basename: DDIMER,  in the last 72 hours Hemoglobin A1C: No results found for this basename: HGBA1C,  in the last 72 hours Fasting Lipid Panel: No results found for this basename: CHOL, HDL, LDLCALC, TRIG, CHOLHDL, LDLDIRECT,  in the last 72 hours Thyroid Function Tests: No results found for this basename: TSH, T4TOTAL,  FREET3, T3FREE, THYROIDAB,  in the last 72 hours  RADIOLOGY: Ct Angio Chest W/cm &/or Wo Cm  06/10/2012  *RADIOLOGY REPORT*  Clinical Data:  Right shoulder pain, shortness of breath, flank pain.  CT ANGIOGRAPHY CHEST CT ABDOMEN AND PELVIS WITH CONTRAST  Technique:  Multidetector CT imaging of the chest was performed using the standard protocol during bolus administration of intravenous contrast.  Multiplanar CT image reconstructions including MIPs were obtained to evaluate the vascular anatomy. Multidetector CT imaging of the abdomen and pelvis was performed using the standard protocol during bolus administration of intravenous contrast.  Contrast: 80mL OMNIPAQUE IOHEXOL 350 MG/ML SOLN  Comparison:   None.  CTA CHEST  Findings:  There is a saddle embolus across the bifurcation of the main pulmonary artery, nearly occlusive at it extent into bilateral lower lobe branches. Adequate contrast opacification of the thoracic aorta with no evidence of dissection, aneurysm, or stenosis. There is classic 3-vessel brachiocephalic arch anatomy. Patchy coronary and aortic calcifications.  No pleural or pericardial effusion.  Sub centimeter precarinal and subcarinal  lymph nodes.  No definite hilar adenopathy.  Extensive ground-glass opacities throughout much of the right lower lobe with some patchy interspersed airspace disease.  There is mild dependent atelectasis posteriorly in the left lower lobe.  Linear scarring or subsegmental atelectasis in the lingula and right middle lobe. Regional bones unremarkable.   Review of the MIP images confirms the above findings.  IMPRESSION:  1.  Saddle pulmonary embolus with occlusive components extending into bilateral lower lobe branches. 2.  Alveolar and airspace opacities throughout   much of the right lower lobe, differential diagnosis pulmonary infarct, pneumonia, other inflammatory or infiltrative processes.  I telephoned the critical test results to Dr. Silverio Lay  at the time of  interpretation.  CT ABDOMEN AND PELVIS  Findings: Scattered atheromatous plaque in the abdominal aorta with a fusiform 3.8 cm infrarenal aneurysm.  No dissection or stenosis. No retroperitoneal hemorrhage.  Small hiatal hernia.  Stomach, small bowel, and colon are nondilated.  No ascites.  No free air.  Unremarkable liver, gallbladder, spleen, adrenal glands, kidneys.  Portal vein patent. Spondylitic changes in the lower lumbar spine. Urinary bladder physiologically distended.  Right inguinal hernia containing only mesenteric fat.  No adenopathy localized.  Review of the MIP images confirms the above findings.  IMPRESSION:  1.  No acute abdominal process. 2.  3.8 cm abdominal aortic aneurysm. Recommend followup by [place] in 2 years.  This recommendation follows ACR consensus guidelines: White Paper of the ACR Incidental Findings Committee II on Vascular Findings.  J Am Coll Radiol 2013; 10:789-794. 3.  Small hiatal hernia.   Original Report Authenticated By: D. Andria Rhein, MD    US Abdomen Complete  06/10/2012  *RADIOLOGY REPORT*  Clinical Data:  Abdominal pain, back pain  COMPLETE ABDOMINAL ULTRASOUND  Comparison:  Lumbar MRI 08/03/2008.  Findings: Study is limited by abundant bowel gas and patient's large body habitus.  No sonographic Murphy's sign.  Gallbladder:  No gallstones, gallbladder wall thickening, or pericholecystic fluid.  Common bile duct:  Measures 4.8 mm in diameter.  Liver:  No focal lesion identified.  Within normal limits in parenchymal echogenicity.  IVC: Not visualized due to abundant bowel gas  Pancreas:  Not visualized  Spleen:  Measures 5.7 cm in length.  Normal echogenicity.  Right Kidney:  Measures 12.4 cm in length.  No mass, hydronephrosis or diagnostic renal calculus  Left Kidney:  Measures 12.3 cm in length.  No mass, hydronephrosis or diagnostic renal calculus.  Abdominal aorta: There is aneurysm of the abdominal aorta measures 4.3 x 3.4 cm extending 3.8 cm cranial caudally.   IMPRESSION:  1.  No gallstones are noted within gallbladder.  Normal CBD. 2.  No hydronephrosis or diagnostic renal calculus. 3.  Aneurysm of the abdominal aorta measures 4.3 x 3.4 cm.   Original Report Authenticated By: Natasha Mead, M.D.    Ct Abdomen Pelvis W Contrast  06/10/2012  *RADIOLOGY REPORT*  Clinical Data:  Right shoulder pain, shortness of breath, flank pain.  CT ANGIOGRAPHY CHEST CT ABDOMEN AND PELVIS WITH CONTRAST  Technique:  Multidetector CT imaging of the chest was performed using the standard protocol during bolus administration of intravenous contrast.  Multiplanar CT image reconstructions including MIPs were obtained to evaluate the vascular anatomy. Multidetector CT imaging of the abdomen and pelvis was performed using the standard protocol during bolus administration of intravenous contrast.  Contrast: 80mL OMNIPAQUE IOHEXOL 350 MG/ML SOLN  Comparison:   None.  CTA CHEST  Findings:  There is a saddle embolus across  the bifurcation of the main pulmonary artery, nearly occlusive at it extent into bilateral lower lobe branches. Adequate contrast opacification of the thoracic aorta with no evidence of dissection, aneurysm, or stenosis. There is classic 3-vessel brachiocephalic arch anatomy. Patchy coronary and aortic calcifications.  No pleural or pericardial effusion.  Sub centimeter precarinal and subcarinal lymph nodes.  No definite hilar adenopathy.  Extensive ground-glass opacities throughout much of the right lower lobe with some patchy interspersed airspace disease.  There is mild dependent atelectasis posteriorly in the left lower lobe.  Linear scarring or subsegmental atelectasis in the lingula and right middle lobe. Regional bones unremarkable.   Review of the MIP images confirms the above findings.  IMPRESSION:  1.  Saddle pulmonary embolus with occlusive components extending into bilateral lower lobe branches. 2.  Alveolar and airspace opacities throughout   much of the right lower  lobe, differential diagnosis pulmonary infarct, pneumonia, other inflammatory or infiltrative processes.  I telephoned the critical test results to Dr. Silverio Lay  at the time of interpretation.  CT ABDOMEN AND PELVIS  Findings: Scattered atheromatous plaque in the abdominal aorta with a fusiform 3.8 cm infrarenal aneurysm.  No dissection or stenosis. No retroperitoneal hemorrhage.  Small hiatal hernia.  Stomach, small bowel, and colon are nondilated.  No ascites.  No free air.  Unremarkable liver, gallbladder, spleen, adrenal glands, kidneys.  Portal vein patent. Spondylitic changes in the lower lumbar spine. Urinary bladder physiologically distended.  Right inguinal hernia containing only mesenteric fat.  No adenopathy localized.  Review of the MIP images confirms the above findings.  IMPRESSION:  1.  No acute abdominal process. 2.  3.8 cm abdominal aortic aneurysm. Recommend followup by [place] in 2 years.  This recommendation follows ACR consensus guidelines: White Paper of the ACR Incidental Findings Committee II on Vascular Findings.  J Am Coll Radiol 2013; 10:789-794. 3.  Small hiatal hernia.   Original Report Authenticated By: D. Andria Rhein, MD    Dg Chest Port 1 View  06/11/2012  *RADIOLOGY REPORT*  Clinical Data: Acute onset of shortness of breath and wheezing.  PORTABLE CHEST - 1 VIEW  Comparison: Chest radiograph and CT of the chest performed 06/10/2012  Findings: The lungs are hypoexpanded.  Mild right basilar airspace opacity may reflect pulmonary infarct, given its appearance on CT. No definite pleural effusion or pneumothorax is seen.  The cardiomediastinal silhouette is borderline normal in size.  No acute osseous abnormalities are identified.  IMPRESSION: Lungs hypoexpanded; mild right basilar airspace opacity may reflect pulmonary infarct, given its appearance on recent CT.  No significant change from the prior study.   Original Report Authenticated By: Tonia Ghent, M.D.    [facility] 1  View  06/10/2012  *RADIOLOGY REPORT*  Clinical Data: Back pain.  PORTABLE CHEST - 1 VIEW  Comparison: None.  Findings: Low lung volumes with bibasilar atelectasis.  Heart size is accentuated by the low volumes.  No acute bony abnormality.  IMPRESSION: Low lung volumes with bibasilar opacities, likely atelectasis.   Original Report Authenticated By: Charlett Nose, M.D.     PHYSICAL EXAM  Patient is oriented to person time and place. Affect is normal. Examination of lungs reveal scattered rhonchi with a few wheezes. Cardiac exam reveals S1 and S2. Abdomen is soft. There is trace peripheral edema.   TELEMETRY: I have reviewed telemetry today June 15, 2012. There is normal sinus rhythm.   ASSESSMENT AND PLAN:    Saddle pulmonary embolus     He  is improving with treatment    New onset atrial fibrillation     Yesterday he had some recurrent asymptomatic atrial fibrillation. Diltiazem was started. Suggest changing him today to a once a day long-acting diltiazem format.   Willa Rough 06/15/2012 8:25 AM

## 2012-06-16 ENCOUNTER — Telehealth: Payer: Self-pay | Admitting: Family Medicine

## 2012-06-16 NOTE — Telephone Encounter (Signed)
Pt was DC'd from hospital yesterday. Pt's wife said instructions say :"follow up appt.ASAP"! Appt was set earlier today for Friday 06/20/12.  Wife thinks that isn't soon enough. Pls advise. Pt had 3 blood clots in leg, one in lung, BP was elevated. Left here 06/10/12 and went to hospital.

## 2012-06-16 NOTE — Telephone Encounter (Signed)
If he is not having acute issues such as worsening dyspnea, Friday should be OK.  Can we offer him follow up sooner?  If possible, let's try to do so. If any ACUTE worsening of chest pain or dyspnea to hospital ASAP

## 2012-06-16 NOTE — Telephone Encounter (Signed)
Please advise 

## 2012-06-17 ENCOUNTER — Ambulatory Visit (INDEPENDENT_AMBULATORY_CARE_PROVIDER_SITE_OTHER): Payer: Self-pay | Admitting: Internal Medicine

## 2012-06-17 ENCOUNTER — Telehealth: Payer: Self-pay | Admitting: *Deleted

## 2012-06-17 ENCOUNTER — Encounter: Payer: Self-pay | Admitting: Internal Medicine

## 2012-06-17 VITALS — BP 137/67 | HR 68 | Ht 70.5 in | Wt 197.8 lb

## 2012-06-17 DIAGNOSIS — R21 Rash and other nonspecific skin eruption: Secondary | ICD-10-CM | POA: Insufficient documentation

## 2012-06-17 DIAGNOSIS — I4891 Unspecified atrial fibrillation: Secondary | ICD-10-CM

## 2012-06-17 DIAGNOSIS — I714 Abdominal aortic aneurysm, without rupture: Secondary | ICD-10-CM

## 2012-06-17 LAB — CULTURE, BLOOD (ROUTINE X 2): Culture: NO GROWTH

## 2012-06-17 MED ORDER — METOPROLOL TARTRATE 100 MG PO TABS: 100.0000 mg | ORAL_TABLET | Freq: Two times a day (BID) | ORAL | Status: DC

## 2012-06-17 MED ORDER — HYDROCHLOROTHIAZIDE 25 MG PO TABS: 25.0000 mg | ORAL_TABLET | Freq: Every day | ORAL | Status: DC

## 2012-06-17 NOTE — Patient Instructions (Addendum)
Your physician wants you to follow-up in: 6 months with Dr Logan Bores will receive a reminder letter in the mail two months in advance. If you don't receive a letter, please call our office to schedule the follow-up appointment.  Your physician has recommended you make the following change in your medication:  STOP Diltiazem   INCREASE Metoprolol to 100 mg twice daily  RESUME HCTZ 25 mg daily  Your physician has recommended that you wear an event monitor. Event monitors are medical devices that record the heart's electrical activity. Doctors most often [place] these monitors to diagnose arrhythmias. Arrhythmias are problems with the speed or rhythm of the heartbeat. The monitor is a small, portable device. You can wear one while you do your normal daily activities. This is usually used to diagnose what is causing palpitations/syncope (passing out).  Your physician wants you to have an abdominal ultrasound

## 2012-06-17 NOTE — Progress Notes (Signed)
Patient Care Team: [name], MD as PCP - General (Family Medicine)   HPI  Jerry Fox is a 79 y.o. male Recently hospitalized and found to have Afib and then Dr DB identified to have PE>>saddle embolus He then had recurrent afib and dilt started;  He has reverted to sinus rhythm.  He continues to have problems with edema and back pain. No significant shortness of breath and the chest pains are much better  Past Medical History  Diagnosis Date  . Arthritis   . Murmur, cardiac   . Hypertension   . Colon polyps   . Hyperlipidemia     Past Surgical History  Procedure Laterality Date  . Tonsillectomy      Current Outpatient Prescriptions  Medication Sig Dispense Refill  . benzonatate (TESSALON) 200 MG capsule Take 1 capsule (200 mg total) by mouth 3 (three) times daily.  21 capsule  0  . diazepam (VALIUM) 5 MG tablet Take 2.5-5 mg by mouth 3 (three) times daily as needed (for anxiety; 2.5 mg every morning and at noon and 5 mg at bedtime).       Marland Kitchen diltiazem (CARDIZEM CD) 180 MG 24 hr capsule Take 1 capsule (180 mg total) by mouth daily.  30 capsule  0  . HYDROcodone-acetaminophen (NORCO/VICODIN) 5-325 MG per tablet Take 1 tablet by mouth 3 (three) times daily as needed for pain (for pain).      Marland Kitchen lovastatin (MEVACOR) 20 MG tablet Take 1 tablet (20 mg total) by mouth at bedtime.  90 tablet  3  . metoprolol (LOPRESSOR) 50 MG tablet Take 1 tablet (50 mg total) by mouth 2 (two) times daily.  60 tablet  0  . polyethylene glycol (MIRALAX / GLYCOLAX) packet Take 17 g by mouth 2 (two) times daily.  60 each  0  . Rivaroxaban (XARELTO) 15 MG TABS tablet Take 1 tablet (15 mg total) by mouth 2 (two) times daily.  40 tablet  0  . [START ON 07/03/2012] Rivaroxaban (XARELTO) 20 MG TABS Take 1 tablet (20 mg total) by mouth daily with supper.  30 tablet  0  . senna-docusate (SENOKOT-S) 8.6-50 MG per tablet Take 1 tablet by mouth 2 (two) times daily.  60 tablet  0  . traMADol (ULTRAM) 50  MG tablet Take 50-100 mg by mouth every 6 (six) hours as needed for pain (for pain).       No current facility-administered medications for this visit.    No Known Allergies  Review of Systems negative except from HPI and PMH  Physical Exam BP 137/67  Pulse 68  Ht 5' 10.5" (1.791 m)  Wt 197 lb 12.8 oz (89.721 kg)  BMI 27.97 kg/m2 Well developed and well nourished in no acute distress HENT normal E scleral and icterus clear Neck Supple   Clear to ausculation  Regular rate and rhythm, no murmurs gallops or rub Soft with active bowel sounds No clubbing cyanosis R>L  Edema Alert and oriented, grossly normal motor and sensory function Skin Warm and Dry; pustular rash on back  Sinus rhythm at 70 Intervals 14/10/42 Left ventricular hypertrophy  Assessment and  Plan

## 2012-06-17 NOTE — Telephone Encounter (Signed)
I spoke with pt and he sounds really good, not having any acute sx.  He is seeing cardiology, Dr Clide Cliff today and feels the 3:45 appt on Friday will be just fine.  Thursday we could use a same day 15 min and acute 15 was the only possible openings sooner than Friday's appt.  FYI

## 2012-06-17 NOTE — Telephone Encounter (Addendum)
Transitional Care Management:  Admission date:06/10/2012 Discharge date:06/15/2012  Discharge Diagnosis: Principal Problem:  Acute respiratory failure with hypoxia Active Problems:  hyperlipidemia  hypertension  Saddle pulmonary embolus  New onset atrial fibrillation  AAA(abdominal aortic aneurysm) without rupture/3.8 cm  leukocytosis  acute pulmonary htn (46 mmhg)  Talked with pt and  He states he feel much better.  He is pain free except for chronic back pain which he says is some better also.  He is coughing up phlegm with the help of the tessalon pearles.  Appetite is good.  He is taking xarelto as ordered,currently 15mg  bid until March7 when he changes to 20 mg daily.  Patient is knowledgeable of his condition and treatment and compliant with his medication.  Appointment with Dr Caryl Never 06-20-2012 at 3:45pm

## 2012-06-17 NOTE — Assessment & Plan Note (Signed)
Blood pressure remains relatively well controlled. We will stop his diltiazem as he has developed a rash and increase his Lopressor. He will be followed up with Dr. Caryl Never just a couple of days.

## 2012-06-17 NOTE — Assessment & Plan Note (Signed)
Continued anticoagulation. In the event that the decision is made towards stopping after 6 months we will 1 to make sure there is no intercurrent atrial fibrillation as his CHADS-VASc score is 3. To that end we will obtain a Holter monitor at 5 months and I will see him at that time

## 2012-06-17 NOTE — Assessment & Plan Note (Signed)
As above.

## 2012-06-17 NOTE — Assessment & Plan Note (Signed)
Will recheck aba anuerysm at followup in 5  months

## 2012-06-18 ENCOUNTER — Other Ambulatory Visit: Payer: Self-pay | Admitting: Internal Medicine

## 2012-06-18 ENCOUNTER — Telehealth: Payer: Self-pay | Admitting: *Deleted

## 2012-06-18 ENCOUNTER — Ambulatory Visit (HOSPITAL_COMMUNITY)
Admission: RE | Admit: 2012-06-18 | Discharge: 2012-06-18 | Disposition: A | Discharge status: Home / Self Care | Payer: Medicare Other | Source: Ambulatory Visit | Attending: Internal Medicine | Admitting: Internal Medicine

## 2012-06-18 DIAGNOSIS — I714 Abdominal aortic aneurysm, without rupture, unspecified: Secondary | ICD-10-CM | POA: Insufficient documentation

## 2012-06-18 NOTE — Telephone Encounter (Signed)
Pt was enrolled for 30 day AF recorder event monitor to be mailed to home address  06/18/12 TK

## 2012-06-20 ENCOUNTER — Encounter: Payer: Self-pay | Admitting: Family Medicine

## 2012-06-20 ENCOUNTER — Ambulatory Visit (INDEPENDENT_AMBULATORY_CARE_PROVIDER_SITE_OTHER): Payer: Medicare Other | Admitting: Family Medicine

## 2012-06-20 ENCOUNTER — Encounter: Payer: Self-pay | Admitting: Internal Medicine

## 2012-06-20 ENCOUNTER — Encounter (INDEPENDENT_AMBULATORY_CARE_PROVIDER_SITE_OTHER): Payer: Medicare Other

## 2012-06-20 VITALS — BP 140/80 | Temp 98.0°F | Wt 195.0 lb

## 2012-06-20 DIAGNOSIS — M545 Low back pain: Secondary | ICD-10-CM

## 2012-06-20 DIAGNOSIS — I4891 Unspecified atrial fibrillation: Secondary | ICD-10-CM

## 2012-06-20 DIAGNOSIS — I2692 Saddle embolus of pulmonary artery without acute cor pulmonale: Secondary | ICD-10-CM

## 2012-06-20 NOTE — Patient Instructions (Addendum)
Follow up immediately for any chest pains, increased shortness of breath, or any bleeding complications.

## 2012-06-20 NOTE — Progress Notes (Signed)
Subjective:    Patient ID: Jerry Fox, male    DOB: [birth year], 79 y.o.   MRN: [id number]  HPI Hospital followup. Patient presented here on 06/10/2012 with severe back pain. He apparently developed some severe right periscapular pain around 7 PM night prior to admission.  Presented here next morning and was sent to emergency department for further evaluation. He refused EMS transport. Patient had evaluation including CT angiogram which showed saddle pulmonary emboli. This was actually discovered after her he was being treated/evaluated for atrial fibrillation.  Admitted 2/11 through 06/15/12.  He had new onset atrial fibrillation and hypoxemia. Was treated initially with IV diltiazem and converted back to sinus rhythm. Patient initially treated with IV heparin. EKG no acute ischemic changes.  Patient was converted to Xarelto currently 15 mg twice daily and plans to transition to 20 mg once daily. He also had DVT left lower extremity. Report was that he had had swelling left lower extremity for one month but he had never mentioned any swelling of the lower extremity nor had he complained to anyone of pain in extremity. Incidental note of 3.8 cm abdominal aortic aneurysm.  Patient denies prior history of blood clotting issue. No family history of coagulopathy. No history of cancer. Major risk factor was probably very sedentary lifestyle with chronic low back pain and little walking. No recent prolonged travel.  Patient doing well at this time. No pleuritic pain. No dyspnea. No cough. No fever.  Pt was contacted by phone from this office within 48 hours of d/c to arrange follow up and assess.  Past Medical History  Diagnosis Date  . Arthritis   . Murmur, cardiac   . Hypertension   . Colon polyps   . Hyperlipidemia    Past Surgical History  Procedure Laterality Date  . Tonsillectomy      reports that he quit smoking about 58 years ago. His smoking use included Cigarettes. He has a 10  pack-year smoking history. He quit smokeless tobacco use about 48 years ago. His smokeless tobacco use included Chew. He reports that he does not drink alcohol. His drug history is not on file. family history includes Arthritis in his father and mother; Heart disease in his father, mother, and paternal grandfather; and Hypertension in his father. No Known Allergies    Review of Systems  Constitutional: Negative for fever, chills, appetite change and unexpected weight change.  Respiratory: Negative for cough and shortness of breath.   Cardiovascular: Negative for chest pain and palpitations.  Gastrointestinal: Negative for nausea, vomiting, abdominal pain, blood in stool and anal bleeding.  Musculoskeletal: Positive for back pain (chronic).  Skin: Negative for rash.  Neurological: Negative for dizziness and headaches.  Hematological: Does not bruise/bleed easily.  Psychiatric/Behavioral: Negative for confusion.       Objective:   Physical Exam  Constitutional: He is oriented to person, place, and time. He appears well-developed and well-nourished. No distress.  Neck: Neck supple. No thyromegaly present.  Cardiovascular: Normal rate and regular rhythm.   Pulmonary/Chest: Effort normal and breath sounds normal. No respiratory distress. He has no wheezes. He has no rales.  Abdominal: Soft. There is no tenderness.  Musculoskeletal:  Mild edema LLE.  No calf pain.  Lymphadenopathy:    He has no cervical adenopathy.  Neurological: He is alert and oriented to person, place, and time.  Psychiatric: He has a normal mood and affect. His behavior is normal.          Assessment &  Plan:  Pulmonary embolus/ LLE DVT.  Probably related to very sedentary lifestyle.  No hx of malignancy.  No FH coagulopathy.  Minimum 6 months anticoagulation.  On Xarelto.  He plans to become more active with walking. We are having to look at prior authorization for Xarelto.  Driving will be difficult for him so  monitoring will be more problematic. Reassess one month.  Transient atrial fibrillation probably secondary to above.  Currently NSR.  Chronic low back pain stable.  AAA 3.8 cm.  Will need follow up yearly to reassess.

## 2012-06-22 ENCOUNTER — Encounter: Payer: Self-pay | Admitting: Family Medicine

## 2012-06-26 ENCOUNTER — Other Ambulatory Visit: Payer: Self-pay | Admitting: *Deleted

## 2012-06-26 MED ORDER — RIVAROXABAN 20 MG PO TABS: 20.0000 mg | ORAL_TABLET | Freq: Every day | ORAL | Status: DC

## 2012-06-27 ENCOUNTER — Other Ambulatory Visit: Payer: Self-pay | Admitting: *Deleted

## 2012-06-27 MED ORDER — METOPROLOL TARTRATE 50 MG PO TABS: 50.0000 mg | ORAL_TABLET | Freq: Two times a day (BID) | ORAL | Status: DC

## 2012-06-27 MED ORDER — TRAMADOL HCL 50 MG PO TABS: 50.0000 mg | ORAL_TABLET | Freq: Four times a day (QID) | ORAL | Status: DC | PRN

## 2012-07-02 ENCOUNTER — Telehealth: Payer: Self-pay | Admitting: Family Medicine

## 2012-07-02 NOTE — Telephone Encounter (Signed)
Pt states Dr Graciela Husbands put in on 100 mg of metoprolol (LOPRESSOR) . Dr Caryl Never has him on 50 mg of metoprolol (LOPRESSOR) 50 MG tablet.  Dr Graciela Husbands advised pt to let MD know about this change. Pt wants to make sure this is OK with you. Pt would like you to call him to confirm.

## 2012-07-02 NOTE — Telephone Encounter (Signed)
Pt informed

## 2012-07-02 NOTE — Telephone Encounter (Signed)
Yes, we are aware.  He should follow Dr Odessa Fleming suggestions.

## 2012-07-18 ENCOUNTER — Encounter: Payer: Self-pay | Admitting: Family Medicine

## 2012-07-18 ENCOUNTER — Ambulatory Visit (INDEPENDENT_AMBULATORY_CARE_PROVIDER_SITE_OTHER): Payer: Medicare Other | Admitting: Family Medicine

## 2012-07-18 VITALS — BP 150/78 | HR 60 | Temp 97.5°F | Resp 12 | Wt 194.0 lb

## 2012-07-18 DIAGNOSIS — I2692 Saddle embolus of pulmonary artery without acute cor pulmonale: Secondary | ICD-10-CM

## 2012-07-18 DIAGNOSIS — M545 Low back pain: Secondary | ICD-10-CM

## 2012-07-18 DIAGNOSIS — I10 Essential (primary) hypertension: Secondary | ICD-10-CM

## 2012-07-18 DIAGNOSIS — G8929 Other chronic pain: Secondary | ICD-10-CM

## 2012-07-18 DIAGNOSIS — I4891 Unspecified atrial fibrillation: Secondary | ICD-10-CM

## 2012-07-18 NOTE — Patient Instructions (Signed)
Monitor blood pressure at home and be in touch if consistently > 140/90. 

## 2012-07-18 NOTE — Progress Notes (Signed)
  Subjective:    Patient ID: Jerry Fox, male    DOB: 1933-07-07, 79 y.o.   MRN: [id number]  HPI Followup from recent hospital admission. Refer to prior note. Patient presented with acute chest pain and dyspnea and new onset atrial fibrillation. He had left lower extremity edema and pain and was diagnosed with acute DVT and pulmonary emboli Atrial fibrillation was transient. Patient currently taking Xarelto 20 mg once daily and doing well. No bleeding complications. No leg pain at this time. No pleuritic pain. No chest pains.   Event monitor  in place.  denies any palpitations. No dizziness. No syncope.   Blood pressure treated with Toprol and HCTZ. Had been on diltiazem and had rash. Not monitoring blood pressure at home. Question of whitecoat syndrome in past.  Chronic back pain. Home physical therapy was never set up. Prior to his DVT and pulmonary emboli is very sedentary. His tried to walk more.  Past Medical History  Diagnosis Date  . Arthritis   . Murmur, cardiac   . Hypertension   . Colon polyps   . Hyperlipidemia   . Atrial fibrillation 2/14    acute pulmonary embolus  . Saddle pulmonary embolus 2/14    LLE DVT  . AAA (abdominal aortic aneurysm) 2/14    3.8 cm 2/14   Past Surgical History  Procedure Laterality Date  . Tonsillectomy      reports that he quit smoking about 58 years ago. His smoking use included Cigarettes. He has a 10 pack-year smoking history. He quit smokeless tobacco use about 48 years ago. His smokeless tobacco use included Chew. He reports that he does not drink alcohol. His drug history is not on file. family history includes Arthritis in his father and mother; Heart disease in his father, mother, and paternal grandfather; and Hypertension in his father. Allergies  Allergen Reactions  . Diltiazem Rash      Review of Systems  Constitutional: Negative for fatigue.  Eyes: Negative for visual disturbance.  Respiratory: Negative for  cough, chest tightness, shortness of breath and wheezing.   Cardiovascular: Negative for chest pain, palpitations and leg swelling.  Gastrointestinal: Negative for abdominal pain and blood in stool.  Neurological: Negative for dizziness, syncope, weakness, light-headedness and headaches.       Objective:   Physical Exam  Constitutional: He appears well-developed and well-nourished.  Neck: Neck supple. No thyromegaly present.  Cardiovascular: Normal rate and regular rhythm.   Pulmonary/Chest: Effort normal and breath sounds normal. No respiratory distress. He has no wheezes. He has no rales.  Musculoskeletal: He exhibits no edema.  Lymphadenopathy:    He has no cervical adenopathy.          Assessment & Plan:   #1 recent acute DVT left lower extremity and pulmonary emboli. Stable on Xarelto. Will be treated for a minimum of 6 months. We're trying to get prior authorization. He was provided further samples today  #2 history of transient atrial fibrillation. Currently sinus rhythm. Event monitor in place. #3 hypertension. Slightly elevated today. Monitor at home closely next month. Be in touch if consistently over 140/90. Consider adding back lisinopril if still elevated  #4 chronic low back pain. Progressively less ambulatory. Set up physical therapy

## 2012-07-22 ENCOUNTER — Telehealth: Payer: Self-pay | Admitting: Family Medicine

## 2012-07-22 NOTE — Telephone Encounter (Signed)
Called and spoke with pt and pt is aware.  Pt states he will continue to monitor his bp and call back if it does not go down any.

## 2012-07-22 NOTE — Telephone Encounter (Signed)
Patient Information:  Caller Name: Angeldejesus  Phone: [phone]  Patient: Jerry Fox, Jerry Fox  Gender: Male  DOB: 1933/12/23  Age: 79 Years  PCP: [facility]  Office Follow Up:  Does the office need to follow up with this patient?: Yes  Instructions For The Office: Pt is asking per his last office visit discussion if he should begin on his Lisinopril again.   Symptoms  Reason For Call & Symptoms: Pt states Dr. Clide Cliff (his cardiologist) doubled his betablocker/heart monitor and took him off his BP medication. Pt had a blood clot on 06/12/12  and has seen Dr. Caryl Never 2x since then including a couple of days ago. He informed MD at that time that his BP is going up. He states MD advised him to monitor it over the next couple of days and notify him if it continued to be elevated. Pts BP is 168/70 last night P-49. This am 163/72 - P of 48. Now it is 169/70 with a P of 46. No other changes since last visit. Pt wishes to know if he should re-start his Lisinopril 10mg  which he has taken for years.  Reviewed Health History In EMR: Yes  Reviewed Medications In EMR: Yes  Reviewed Allergies In EMR: Yes  Reviewed Surgeries / Procedures: Yes  Date of Onset of Symptoms: 07/11/2012  Guideline(s) Used:  High Blood Pressure  Disposition Per Guideline:   See Within 2 Weeks in Office  Reason For Disposition Reached:   BP > 140/90 and is not taking BP medications  Advice Given:  N/A  Patient Refused Recommendation:  Patient Requests Prescription  Pt just seen at the office and following up with BP concerns. Would like to get info to Dr. Caryl Never.

## 2012-07-22 NOTE — Telephone Encounter (Signed)
pls advise

## 2012-07-22 NOTE — Telephone Encounter (Signed)
Yes.  Would start back lisinopril 10 mg daily.

## 2012-07-24 ENCOUNTER — Telehealth: Payer: Self-pay | Admitting: Internal Medicine

## 2012-07-24 NOTE — Telephone Encounter (Signed)
I spoke with the patient and made his aware his final report was just received today for his event monitor. This has been pulled for Dr. Graciela Husbands to review. We will call him back after this has been read. He is agreeable.

## 2012-07-24 NOTE — Telephone Encounter (Signed)
New Prob    Calling to follow up on heart monitor results. Would like to speak to nurse.

## 2012-07-30 ENCOUNTER — Ambulatory Visit: Payer: Medicare Other | Attending: Family Medicine | Admitting: Physical Therapy

## 2012-07-30 DIAGNOSIS — M545 Low back pain, unspecified: Secondary | ICD-10-CM | POA: Insufficient documentation

## 2012-07-30 DIAGNOSIS — IMO0001 Reserved for inherently not codable concepts without codable children: Secondary | ICD-10-CM | POA: Insufficient documentation

## 2012-07-30 DIAGNOSIS — R5381 Other malaise: Secondary | ICD-10-CM | POA: Insufficient documentation

## 2012-07-31 ENCOUNTER — Telehealth: Payer: Self-pay | Admitting: Family Medicine

## 2012-07-31 NOTE — Telephone Encounter (Signed)
Pt wanted to let you know that he feels that his bp has been running high. Highest: 179/74 151/68 Pulse: 51 Pt has no symptoms, feeling good,he's steady on his feet, no others symptoms. Pt is back on  bp meds. (Dr Graciela Husbands took him off while he wore monitor).  Pls advise

## 2012-07-31 NOTE — Telephone Encounter (Signed)
I spoke with the patient and made him aware his event monitor was reviewed by Dr. Graciela Husbands- a-fib on monitor. I have advised to continue his xarelto and all other medications. We will follow up with him in August.

## 2012-07-31 NOTE — Telephone Encounter (Signed)
Pt has been taking his BP med in the AM, however he used to take it in the PM and would like to take another BP med this evening and begin taking in the evening.  I told him he could do that tonight to transition back to taking in the PM.  He will continue to monitor his BP and if it remains running high, he will schedule OV.  FYI

## 2012-08-04 ENCOUNTER — Encounter: Payer: Medicare Other | Admitting: Physical Therapy

## 2012-08-27 ENCOUNTER — Telehealth: Payer: Self-pay | Admitting: *Deleted

## 2012-08-27 NOTE — Telephone Encounter (Signed)
Hydrocodone 5-325 TID last filled #90 with 3 refills.  It appears this was last filled by [facility], #90 with 3 refills

## 2012-08-27 NOTE — Telephone Encounter (Signed)
Refill for 3 months OK. 

## 2012-08-28 MED ORDER — HYDROCODONE-ACETAMINOPHEN 5-325 MG PO TABS: 1.0000 | ORAL_TABLET | Freq: Three times a day (TID) | ORAL | Status: DC | PRN

## 2012-09-30 ENCOUNTER — Telehealth: Payer: Self-pay | Admitting: Family Medicine

## 2012-09-30 NOTE — Telephone Encounter (Signed)
Attempted callback per patient request due to  prescription issue.  Obtained patient voicemail and left message for patient to follow up.

## 2012-10-01 ENCOUNTER — Other Ambulatory Visit: Payer: Self-pay | Admitting: Family Medicine

## 2012-10-01 MED ORDER — TRAMADOL HCL 50 MG PO TABS: 50.0000 mg | ORAL_TABLET | Freq: Three times a day (TID) | ORAL | Status: DC | PRN

## 2012-10-01 NOTE — Telephone Encounter (Signed)
Sent to the pharmacy by e-scribe #90 per BB.

## 2012-10-01 NOTE — Telephone Encounter (Signed)
Spoke to pt needs refill on Tramadol and would like 90 supply due to taking 3 tablets a day. Please advise if okay to fill? Pt has cut back on Hydrocodone.

## 2012-10-01 NOTE — Telephone Encounter (Signed)
OK to refill 90 days with one refill.

## 2012-10-20 ENCOUNTER — Ambulatory Visit (INDEPENDENT_AMBULATORY_CARE_PROVIDER_SITE_OTHER): Payer: Medicare Other | Admitting: Family Medicine

## 2012-10-20 ENCOUNTER — Encounter: Payer: Self-pay | Admitting: Family Medicine

## 2012-10-20 VITALS — BP 150/84 | HR 54 | Temp 97.9°F | Resp 14 | Wt 198.0 lb

## 2012-10-20 DIAGNOSIS — I10 Essential (primary) hypertension: Secondary | ICD-10-CM

## 2012-10-20 DIAGNOSIS — E785 Hyperlipidemia, unspecified: Secondary | ICD-10-CM

## 2012-10-20 DIAGNOSIS — M545 Low back pain: Secondary | ICD-10-CM

## 2012-10-20 DIAGNOSIS — I2692 Saddle embolus of pulmonary artery without acute cor pulmonale: Secondary | ICD-10-CM

## 2012-10-20 DIAGNOSIS — G8929 Other chronic pain: Secondary | ICD-10-CM

## 2012-10-20 LAB — BASIC METABOLIC PANEL
BUN: 15 mg/dL (ref 6–23)
CO2: 31 mEq/L (ref 19–32)
Glucose, Bld: 90 mg/dL (ref 70–99)
Potassium: 4.3 mEq/L (ref 3.5–5.1)
Sodium: 141 mEq/L (ref 135–145)

## 2012-10-20 LAB — HEPATIC FUNCTION PANEL
ALT: 15 U/L (ref 0–53)
AST: 17 U/L (ref 0–37)
Albumin: 3.8 g/dL (ref 3.5–5.2)
Alkaline Phosphatase: 53 U/L (ref 39–117)
Total Protein: 7.4 g/dL (ref 6.0–8.3)

## 2012-10-20 NOTE — Progress Notes (Signed)
  Subjective:    Patient ID: Jerry Fox, male    DOB: [birth year], 79 y.o.   MRN: [id number]  HPI  Medical followup Patient has history of hypertension, transient atrial fibrillation, hyperlipidemia, chronic low back pain, abdominal aortic aneurysm, and history of DVT/pulmonary embolus. Patient presented with DVT and pulmonary embolus last winter. Transient atrial fibrillation. No prior history of DVT or pulmonary embolus. Started on Xarelto and compliant with therapy with no bleeding complications. Denies any dyspnea or chest pains. No dizziness. Patient had abdominal ultrasound back in February with stable aortic aneurysm. He knows this needs to be repeated on a yearly basis  Blood pressures have been well controlled by home readings. Mostly 130s to 140s systolic. Currently takes metoprolol 100 mg twice a day and HCTZ 25 mg daily.  He has chronic low back pain and has tried tapering back on his diazepam and hydrocodone- generally taking one daily. Mostly takes this at night to help with sleep. Currently getting very little exercise and we have discussed getting more active previously.  Past Medical History  Diagnosis Date  . Arthritis   . Murmur, cardiac   . Hypertension   . Colon polyps   . Hyperlipidemia   . Atrial fibrillation 2/14    acute pulmonary embolus  . Saddle pulmonary embolus 2/14    LLE DVT  . AAA (abdominal aortic aneurysm) 2/14    3.8 cm 2/14   Past Surgical History  Procedure Laterality Date  . Tonsillectomy      reports that he quit smoking about 58 years ago. His smoking use included Cigarettes. He has a 10 pack-year smoking history. He quit smokeless tobacco use about 48 years ago. His smokeless tobacco use included Chew. He reports that he does not drink alcohol. His drug history is not on file. family history includes Arthritis in his father and mother; Heart disease in his father, mother, and paternal grandfather; and Hypertension in his  father. Allergies  Allergen Reactions  . Diltiazem Rash     Review of Systems  Constitutional: Negative for appetite change, fatigue and unexpected weight change.  Eyes: Negative for visual disturbance.  Respiratory: Negative for cough, chest tightness and shortness of breath.   Cardiovascular: Negative for chest pain, palpitations and leg swelling.  Neurological: Negative for dizziness, syncope, weakness, light-headedness and headaches.       Objective:   Physical Exam  Constitutional: He appears well-developed and well-nourished. No distress.  Neck: Neck supple. No thyromegaly present.  Cardiovascular: Normal rate and regular rhythm.   Pulmonary/Chest: Effort normal and breath sounds normal. No respiratory distress. He has no wheezes. He has no rales.  Musculoskeletal: He exhibits no edema.  Lymphadenopathy:    He has no cervical adenopathy.          Assessment & Plan:  #1 hypertension. Slightly elevated today but well controlled by home readings. Continue close monitoring. Recheck basic metabolic panel #2 history atrial fibrillation. Currently sinus rhythm #3 history of pulmonary embolus. Maintained on Xarelto and doing well #4 chronic low back pain. We discussed strategies such as Pilates to help with core strengthening and he will see if there any local classes available

## 2012-10-30 ENCOUNTER — Telehealth: Payer: Self-pay

## 2012-10-30 NOTE — Telephone Encounter (Signed)
Tramadol HCL 50mg  tab #90 Sig: Take 1 to 2 tablets every 8 hours as needed for pain

## 2012-11-03 MED ORDER — TRAMADOL HCL 50 MG PO TABS: 50.0000 mg | ORAL_TABLET | Freq: Three times a day (TID) | ORAL | Status: DC | PRN

## 2012-11-03 NOTE — Telephone Encounter (Signed)
Refill for 3 months. 

## 2012-11-03 NOTE — Telephone Encounter (Signed)
Rx request to pharmacy/[facility]  

## 2012-11-20 ENCOUNTER — Ambulatory Visit: Payer: PRIVATE HEALTH INSURANCE | Admitting: Family Medicine

## 2012-12-30 ENCOUNTER — Telehealth: Payer: Self-pay

## 2012-12-30 MED ORDER — DIAZEPAM 5 MG PO TABS: 2.5000 mg | ORAL_TABLET | Freq: Three times a day (TID) | ORAL | Status: DC | PRN

## 2012-12-30 NOTE — Telephone Encounter (Signed)
Refill for 6 months. 

## 2012-12-30 NOTE — Telephone Encounter (Signed)
Phone in

## 2012-12-30 NOTE — Telephone Encounter (Signed)
Diazepam 5 mg  Last visit 10/20/12 Last refill 04/02/12 #60 5 refills  [facility]

## 2013-01-27 ENCOUNTER — Telehealth: Payer: Self-pay

## 2013-01-27 NOTE — Telephone Encounter (Signed)
Hydrocodone/APAP  Last refill 08/28/12 #90 2 refill Last visit 10/20/12  [facility]

## 2013-01-27 NOTE — Telephone Encounter (Signed)
Refill once 

## 2013-01-28 MED ORDER — HYDROCODONE-ACETAMINOPHEN 5-325 MG PO TABS: 1.0000 | ORAL_TABLET | Freq: Three times a day (TID) | ORAL | Status: DC | PRN

## 2013-01-28 NOTE — Telephone Encounter (Signed)
Called in RX 

## 2013-02-05 ENCOUNTER — Encounter: Payer: Self-pay | Admitting: Gastroenterology

## 2013-02-20 ENCOUNTER — Ambulatory Visit (INDEPENDENT_AMBULATORY_CARE_PROVIDER_SITE_OTHER): Payer: Medicare Other

## 2013-02-20 DIAGNOSIS — Z23 Encounter for immunization: Secondary | ICD-10-CM

## 2013-03-31 ENCOUNTER — Encounter: Payer: Self-pay | Admitting: Family

## 2013-03-31 ENCOUNTER — Ambulatory Visit (INDEPENDENT_AMBULATORY_CARE_PROVIDER_SITE_OTHER): Payer: Medicare Other | Admitting: Family

## 2013-03-31 VITALS — BP 130/80 | HR 59 | Temp 97.9°F | Wt 213.0 lb

## 2013-03-31 DIAGNOSIS — R5381 Other malaise: Secondary | ICD-10-CM

## 2013-03-31 DIAGNOSIS — R001 Bradycardia, unspecified: Secondary | ICD-10-CM

## 2013-03-31 DIAGNOSIS — I498 Other specified cardiac arrhythmias: Secondary | ICD-10-CM

## 2013-03-31 DIAGNOSIS — I4891 Unspecified atrial fibrillation: Secondary | ICD-10-CM

## 2013-03-31 MED ORDER — METOPROLOL TARTRATE 50 MG PO TABS: 50.0000 mg | ORAL_TABLET | Freq: Two times a day (BID) | ORAL | Status: DC

## 2013-03-31 NOTE — Progress Notes (Signed)
Subjective:    Patient ID: Jerry Fox, male    DOB: February 23, 1934, 79 y.o.   MRN: [id number]  HPI 79 year old white male, nonsmoker, patient of Dr. Caryl Never is in today with complaints of weakness and fatigue that occurred yesterday when he came from taking his grandson to school. Patient reports that he has been exercising twice a day since Saturday walking one fourth of a mile. He has no discomfort, dyspnea, chest pain, weakness or fatigue with walking or exertion. His wife reports taking his blood pressure at that time and it was 130/45 with a heart rate of 40-45. He has a history of DVT related to atrophic fibrillation and is under the care of cardiology. Takes Xarelto daily and tolerates it well.    Review of Systems  Constitutional: Positive for fatigue.  HENT: Negative.   Respiratory: Negative.   Cardiovascular: Negative.   Endocrine: Negative.   Musculoskeletal: Negative.   Skin: Negative.   Neurological: Positive for light-headedness. Negative for dizziness.  Hematological: Negative.   Psychiatric/Behavioral: Negative.    Past Medical History  Diagnosis Date  . Arthritis   . Murmur, cardiac   . Hypertension   . Colon polyps   . Hyperlipidemia   . Atrial fibrillation 2/14    acute pulmonary embolus  . Saddle pulmonary embolus 2/14    LLE DVT  . AAA (abdominal aortic aneurysm) 2/14    3.8 cm 2/14    History   Social History  . Marital Status: Married    Spouse Name: N/A    Number of Children: N/A  . Years of Education: N/A   Occupational History  . Not on file.   Social History Main Topics  . Smoking status: Former Smoker -- 1.00 packs/day for 10 years    Types: Cigarettes    Quit date: 01/31/1954  . Smokeless tobacco: Former Neurosurgeon    Types: Chew    Quit date: 02/01/1964  . Alcohol Use: No  . Drug Use: Not on file  . Sexual Activity: Not on file   Other Topics Concern  . Not on file   Social History Narrative  . No narrative on file    Past  Surgical History  Procedure Laterality Date  . Tonsillectomy      Family History  Problem Relation Age of Onset  . Arthritis Mother   . Heart disease Mother   . Arthritis Father   . Heart disease Father   . Hypertension Father   . Heart disease Paternal Grandfather     Allergies  Allergen Reactions  . Diltiazem Rash    Current Outpatient Prescriptions on File Prior to Visit  Medication Sig Dispense Refill  . diazepam (VALIUM) 5 MG tablet Take 0.5-1 tablets (2.5-5 mg total) by mouth 3 (three) times daily as needed (for anxiety; 2.5 mg every morning and at noon and 5 mg at bedtime).  60 tablet  5  . hydrochlorothiazide (HYDRODIURIL) 25 MG tablet Take 1 tablet (25 mg total) by mouth daily.  90 tablet  2  . HYDROcodone-acetaminophen (NORCO/VICODIN) 5-325 MG per tablet Take 1 tablet by mouth 3 (three) times daily as needed for pain (for pain).  90 tablet  0  . lovastatin (MEVACOR) 20 MG tablet Take 1 tablet (20 mg total) by mouth at bedtime.  90 tablet  3  . polyethylene glycol (MIRALAX / GLYCOLAX) packet Take 17 g by mouth 2 (two) times daily as needed.      . Rivaroxaban (XARELTO) 20 MG  TABS Take 1 tablet (20 mg total) by mouth daily with supper.  30 tablet  11  . senna-docusate (SENOKOT-S) 8.6-50 MG per tablet Take 1 tablet by mouth 2 (two) times daily as needed.      . traMADol (ULTRAM) 50 MG tablet Take 1-2 tablets (50-100 mg total) by mouth every 8 (eight) hours as needed for pain (for pain).  90 tablet  2   No current facility-administered medications on file prior to visit.    BP 130/80  Pulse 59  Temp(Src) 97.9 F (36.6 C) (Oral)  Wt 213 lb (96.616 kg)  SpO2 96%chart    Objective:   Physical Exam  Constitutional: He is oriented to person, place, and time. He appears well-developed and well-nourished.  Neck: Normal range of motion. Neck supple.  Cardiovascular: Normal rate, regular rhythm and normal heart sounds.   Pulmonary/Chest: Effort normal and breath sounds  normal.  Abdominal: Soft. Bowel sounds are normal.  Musculoskeletal: Normal range of motion.  Neurological: He is alert and oriented to person, place, and time.  Skin: Skin is warm and dry.  Psychiatric: He has a normal mood and affect.          Assessment & Plan:  Assessment: 1. Bradycardia 2. Fatigue  Plan: Decrease Lopressor to 50mg  twice a day. Hopefully he will sustain a higher heart rate had decreased fatigue. Continue exercising. Recheck in 2 weeks consider as needed.

## 2013-03-31 NOTE — Patient Instructions (Signed)
1. Decrease Lopressor to 50mg  twice a day. Recheck in 2 weeks. Call if you have any episodes of fatigue and low heart rate.  Bradycardia Bradycardia is a term for a heart rate (pulse) that, in adults, is slower than 60 beats per minute. A normal rate is 60 to 100 beats per minute. A heart rate below 60 beats per minute may be normal for some adults with healthy hearts. If the rate is too slow, the heart may have trouble pumping the volume of blood the body needs. If the heart rate gets too low, blood flow to the brain may be decreased and may make you feel lightheaded, dizzy, or faint. The heart has a natural pacemaker in the top of the heart called the SA node (sinoatrial or sinus node). This pacemaker sends out regular electrical signals to the muscle of the heart, telling the heart muscle when to beat (contract). The electrical signal travels from the upper parts of the heart (atria) through the AV node (atrioventricular node), to the lower chambers of the heart (ventricles). The ventricles squeeze, pumping the blood from your heart to your lungs and to the rest of your body. CAUSES   Problem with the heart's electrical system.  Problem with the heart's natural pacemaker.  Heart disease, damage, or infection.  Medications.  Problems with minerals and salts (electrolytes). SYMPTOMS   Fainting (syncope).  Fatigue and weakness.  Shortness of breath (dyspnea).  Chest pain (angina).  Drowsiness.  Confusion. DIAGNOSIS   An electrocardiogram (ECG) can help your caregiver determine the type of slow heart rate you have.  If the cause is not seen on an ECG, you may need to wear a heart monitor that records your heart rhythm for several hours or days.  Blood tests. TREATMENT   Electrolyte supplements.  Medications.  Withholding medication which is causing a slow heart rate.  Pacemaker placement. SEEK IMMEDIATE MEDICAL CARE IF:   You feel lightheaded or faint.  You develop an  irregular heart rate.  You feel chest pain or have trouble breathing. MAKE SURE YOU:   Understand these instructions.  Will watch your condition.  Will get help right away if you are not doing well or get worse. Document Released: 01/06/2002 Document Revised: 07/09/2011 Document Reviewed: 12/03/2007 [facility] Patient Information 2014 [place], [place].

## 2013-04-08 ENCOUNTER — Telehealth: Payer: Self-pay

## 2013-04-08 MED ORDER — TRAMADOL HCL 50 MG PO TABS: 50.0000 mg | ORAL_TABLET | Freq: Three times a day (TID) | ORAL | Status: DC | PRN

## 2013-04-08 NOTE — Telephone Encounter (Signed)
Tramadol  Last refill 11/03/12 #90 2 refills Last visit 03/31/13 seen by Oran Rein Last visit 10/20/12 seen by Dr. Caryl Never  [facility]

## 2013-04-08 NOTE — Telephone Encounter (Signed)
Called in RX 

## 2013-04-08 NOTE — Telephone Encounter (Signed)
Refill OK

## 2013-04-10 ENCOUNTER — Encounter: Payer: Self-pay | Admitting: *Deleted

## 2013-04-13 ENCOUNTER — Encounter: Payer: Self-pay | Admitting: Family Medicine

## 2013-04-13 ENCOUNTER — Ambulatory Visit (INDEPENDENT_AMBULATORY_CARE_PROVIDER_SITE_OTHER): Payer: Medicare Other | Admitting: Family Medicine

## 2013-04-13 VITALS — BP 140/70 | HR 53 | Temp 97.4°F | Wt 210.0 lb

## 2013-04-13 DIAGNOSIS — I10 Essential (primary) hypertension: Secondary | ICD-10-CM

## 2013-04-13 DIAGNOSIS — I714 Abdominal aortic aneurysm, without rupture: Secondary | ICD-10-CM

## 2013-04-13 DIAGNOSIS — I4891 Unspecified atrial fibrillation: Secondary | ICD-10-CM

## 2013-04-13 DIAGNOSIS — E785 Hyperlipidemia, unspecified: Secondary | ICD-10-CM

## 2013-04-13 LAB — BASIC METABOLIC PANEL
CO2: 30 mEq/L (ref 19–32)
Chloride: 99 mEq/L (ref 96–112)
GFR: 85.79 mL/min (ref >60.00)
Glucose, Bld: 89 mg/dL (ref 70–99)
Potassium: 4.7 mEq/L (ref 3.5–5.1)
Sodium: 138 mEq/L (ref 135–145)

## 2013-04-13 NOTE — Progress Notes (Signed)
Pre visit review using our clinic review tool, if applicable. No additional management support is needed unless otherwise documented below in the visit note. 

## 2013-04-13 NOTE — Progress Notes (Signed)
   Subjective:    Patient ID: Jerry Fox, male    DOB: 11-02-1933, 79 y.o.   MRN: [id number]  HPI Patient is seen for medical followup Last year he developed DVT with saddle pulmonary embolus. He was diagnosed with new-onset atrial fibrillation. He is maintained on Xarelto.  No bleeding complications. Recently had episode of fatigue and bradycardia with heart rate around 43. His Lopressor was reduced to 50 mg twice daily. Pulses been consistently between 50 and 55 since then. He has curtailed his walking. No recent chest pains. Blood pressures been well controlled.  His abdominal aortic aneurysm last screened last February. Needs to have set up for followup. Hyperlipidemia treated with lovastatin. No myalgias. His lipids were checked last June. Wife states he is very sedentary. He does walk some for exercise.  Past Medical History  Diagnosis Date  . Arthritis   . Murmur, cardiac   . Hypertension   . Colon polyps   . Hyperlipidemia   . Atrial fibrillation 2/14    acute pulmonary embolus  . Saddle pulmonary embolus 2/14    LLE DVT  . AAA (abdominal aortic aneurysm) 2/14    3.8 cm 2/14   Past Surgical History  Procedure Laterality Date  . Tonsillectomy      reports that he quit smoking about 59 years ago. His smoking use included Cigarettes. He has a 10 pack-year smoking history. He quit smokeless tobacco use about 49 years ago. His smokeless tobacco use included Chew. He reports that he does not drink alcohol. His drug history is not on file. family history includes Arthritis in his father and mother; Heart disease in his father, mother, and paternal grandfather; Hypertension in his father. Allergies  Allergen Reactions  . Diltiazem Rash      Review of Systems  Constitutional: Positive for fatigue.  Eyes: Negative for visual disturbance.  Respiratory: Negative for cough, chest tightness and shortness of breath.   Cardiovascular: Negative for chest pain, palpitations  and leg swelling.  Neurological: Negative for dizziness, syncope, weakness, light-headedness and headaches.       Objective:   Physical Exam  Constitutional: He is oriented to person, place, and time. He appears well-developed and well-nourished.  HENT:  Right Ear: External ear normal.  Left Ear: External ear normal.  Mouth/Throat: Oropharynx is clear and moist.  Eyes: Pupils are equal, round, and reactive to light.  Neck: Neck supple. No thyromegaly present.  Cardiovascular: Normal rate and regular rhythm.   Pulmonary/Chest: Effort normal and breath sounds normal. No respiratory distress. He has no wheezes. He has no rales.  Musculoskeletal: He exhibits no edema.  Neurological: He is alert and oriented to person, place, and time.          Assessment & Plan:  #1 abdominal aortic aneurysm which is asymptomatic. Set up followup screening #2 hypertension which has been well controlled by home readings #3 recent bradycardia which was symptomatic. Symptoms have improved since reducing Lopressor to 50 mg twice daily. Continue his current dosage.  We do not see any reason he cannot resume his walking. #4 saddle pulmonary embolus with history of DVT and history of atrial fibrillation. Continue with Xarelto.

## 2013-04-28 ENCOUNTER — Ambulatory Visit (HOSPITAL_COMMUNITY): Payer: Medicare Other | Attending: Cardiovascular Disease

## 2013-04-28 ENCOUNTER — Encounter: Payer: Self-pay | Admitting: Cardiovascular Disease

## 2013-04-28 DIAGNOSIS — I714 Abdominal aortic aneurysm, without rupture, unspecified: Secondary | ICD-10-CM | POA: Insufficient documentation

## 2013-04-28 DIAGNOSIS — E785 Hyperlipidemia, unspecified: Secondary | ICD-10-CM | POA: Insufficient documentation

## 2013-04-28 DIAGNOSIS — I10 Essential (primary) hypertension: Secondary | ICD-10-CM | POA: Insufficient documentation

## 2013-05-19 ENCOUNTER — Telehealth: Payer: Self-pay | Admitting: Family Medicine

## 2013-05-19 NOTE — Telephone Encounter (Signed)
Pt states he needs another "[employer]" letter sent to [employer] for his Rivaroxaban (XARELTO) 20 MG TABS.  He says one was sent last year and he needs a new one.

## 2013-05-20 NOTE — Telephone Encounter (Signed)
See if we can find one from last year and make copy with current date

## 2013-05-21 NOTE — Telephone Encounter (Signed)
Faxed [employer] to [facility]

## 2013-06-11 ENCOUNTER — Telehealth: Payer: Self-pay | Admitting: Family Medicine

## 2013-06-11 MED ORDER — LOVASTATIN 20 MG PO TABS: 20.0000 mg | ORAL_TABLET | Freq: Every day | ORAL | Status: DC

## 2013-06-11 NOTE — Telephone Encounter (Signed)
Rx sent to pharmacy   

## 2013-06-11 NOTE — Telephone Encounter (Signed)
[facility] script for lovastatin (MEVACOR) 20 MG tablet #90, last filled 03/09/13

## 2013-07-01 ENCOUNTER — Other Ambulatory Visit: Payer: Self-pay | Admitting: Family Medicine

## 2013-07-01 NOTE — Telephone Encounter (Signed)
Tramadol  Last visit 04/13/13 Last refill 04/08/13 #90 0 refill

## 2013-07-02 NOTE — Telephone Encounter (Signed)
Refill for 6 months. 

## 2013-09-01 ENCOUNTER — Telehealth: Payer: Self-pay | Admitting: Family Medicine

## 2013-09-01 MED ORDER — TRAMADOL HCL 50 MG PO TABS: ORAL_TABLET | ORAL | Status: DC

## 2013-09-01 NOTE — Telephone Encounter (Signed)
rx changed and call into pharmacy. Can you call patient back to set up follow up appt.

## 2013-09-01 NOTE — Telephone Encounter (Signed)
Agree with follow up.  Refill Tramadol for one every 6-8 hours prn.

## 2013-09-01 NOTE — Telephone Encounter (Signed)
Last visit 04/13/13 Last refill 07/02/13 #90 1 refill   Is it okay to change to 3 tabs a day?

## 2013-09-01 NOTE — Telephone Encounter (Addendum)
Pt would like get a refill of traMADol (ULTRAM) 50 MG tablet and take 3 tabs/a day instead of 1-2. Pt states he is in much pain all the time. Wanted to ask about another mri.  Advised pt to resc his June appt to this week due to ongoing issues. is this ok?

## 2013-09-01 NOTE — Telephone Encounter (Signed)
Pt aware and appt scheduled.  

## 2013-09-02 ENCOUNTER — Other Ambulatory Visit: Payer: Self-pay | Admitting: Family Medicine

## 2013-09-02 ENCOUNTER — Other Ambulatory Visit: Payer: Self-pay | Admitting: Internal Medicine

## 2013-09-03 ENCOUNTER — Ambulatory Visit (INDEPENDENT_AMBULATORY_CARE_PROVIDER_SITE_OTHER): Payer: Medicare Other | Admitting: Family Medicine

## 2013-09-03 ENCOUNTER — Encounter: Payer: Self-pay | Admitting: Family Medicine

## 2013-09-03 VITALS — BP 140/80 | HR 60 | Wt 211.0 lb

## 2013-09-03 DIAGNOSIS — E785 Hyperlipidemia, unspecified: Secondary | ICD-10-CM

## 2013-09-03 DIAGNOSIS — I714 Abdominal aortic aneurysm, without rupture, unspecified: Secondary | ICD-10-CM

## 2013-09-03 DIAGNOSIS — I10 Essential (primary) hypertension: Secondary | ICD-10-CM

## 2013-09-03 DIAGNOSIS — M1990 Unspecified osteoarthritis, unspecified site: Secondary | ICD-10-CM

## 2013-09-03 LAB — BASIC METABOLIC PANEL
BUN: 12 mg/dL (ref 6–23)
CO2: 32 mEq/L (ref 19–32)
CREATININE: 1 mg/dL (ref 0.4–1.5)
Calcium: 9.6 mg/dL (ref 8.4–10.5)
Chloride: 97 mEq/L (ref 96–112)
GFR: 80.57 mL/min (ref >60.00)
Glucose, Bld: 66 mg/dL — ABNORMAL LOW (ref 70–99)
POTASSIUM: 4.5 meq/L (ref 3.5–5.1)
Sodium: 137 mEq/L (ref 135–145)

## 2013-09-03 LAB — HEPATIC FUNCTION PANEL
ALK PHOS: 48 U/L (ref 39–117)
ALT: 16 U/L (ref 0–53)
AST: 21 U/L (ref 0–37)
Albumin: 4 g/dL (ref 3.5–5.2)
BILIRUBIN TOTAL: 1 mg/dL (ref 0.2–1.2)
Bilirubin, Direct: 0 mg/dL (ref 0.0–0.3)
TOTAL PROTEIN: 7.4 g/dL (ref 6.0–8.3)

## 2013-09-03 LAB — LIPID PANEL
CHOLESTEROL: 128 mg/dL (ref 0–200)
HDL: 42.8 mg/dL (ref >39.00)
LDL Cholesterol: 64 mg/dL (ref 0–99)
Total CHOL/HDL Ratio: 3
Triglycerides: 106 mg/dL (ref 0.0–149.0)
VLDL: 21.2 mg/dL (ref 0.0–40.0)

## 2013-09-03 MED ORDER — LISINOPRIL 10 MG PO TABS: 10.0000 mg | ORAL_TABLET | Freq: Every day | ORAL | Status: DC

## 2013-09-03 NOTE — Progress Notes (Signed)
   Subjective:    Patient ID: Jerry Fox, male    DOB: [birth year], 80 y.o.   MRN: [id number]  HPI Patient is here to discuss the following issues  He has hypertension. He her remains on metoprolol and HCTZ. For some reason lisinopril was taken off his list that he is still taking this regularly. Blood pressures been stable. No recent orthostasis. No headaches. No chest pains.  The patient had DVT a year ago with saddle pulmonary emboli. History of atrial fibrillation. He remains on xarelto 20 mg daily.  No recent bleeding complications.  Has chronic osteoarthritis especially involving the back with chronic back pain. He is currently taking regimen of tramadol and occasionally supplements with hydrocodone or diazepam. He has recently noted that when increasing his tramadol 100 mg the morning and 100 mg at night his pain has done much better than the combination of low-dose tramadol with hydrocodone.  He has history of abdominal aortic aneurysm with most recent x-ray February 2014. Needs followup surveillance.  Hyperlipidemia treated with low-dose statin. No myalgias  Past Medical History  Diagnosis Date  . Arthritis   . Murmur, cardiac   . Hypertension   . Colon polyps   . Hyperlipidemia   . Atrial fibrillation 2/14    acute pulmonary embolus  . Saddle pulmonary embolus 2/14    LLE DVT  . AAA (abdominal aortic aneurysm) 2/14    3.8 cm 2/14   Past Surgical History  Procedure Laterality Date  . Tonsillectomy      reports that he quit smoking about 59 years ago. His smoking use included Cigarettes. He has a 10 pack-year smoking history. He quit smokeless tobacco use about 49 years ago. His smokeless tobacco use included Chew. He reports that he does not drink alcohol. His drug history is not on file. family history includes Arthritis in his father and mother; Heart disease in his father, mother, and paternal grandfather; Hypertension in his father. Allergies  Allergen  Reactions  . Diltiazem Rash      Review of Systems  Constitutional: Negative for fatigue and unexpected weight change.  Eyes: Negative for visual disturbance.  Respiratory: Negative for cough, chest tightness and shortness of breath.   Cardiovascular: Negative for chest pain, palpitations and leg swelling.  Musculoskeletal: Positive for back pain. Negative for gait problem.  Neurological: Negative for dizziness, syncope, weakness, light-headedness and headaches.       Objective:   Physical Exam  Constitutional: He appears well-developed and well-nourished.  HENT:  Right Ear: External ear normal.  Left Ear: External ear normal.  Mouth/Throat: Oropharynx is clear and moist.  Neck: Neck supple. No thyromegaly present.  Cardiovascular: Normal rate and regular rhythm.   Pulmonary/Chest: Effort normal and breath sounds normal. No respiratory distress. He has no wheezes. He has no rales.  Musculoskeletal: He exhibits no edema.  Neurological: He is alert.          Assessment & Plan:  #1 hypertension stable. Refill lisinopril for one year #2 history of abdominal aortic aneurysm. No screen over one year. Set up ultrasound of aorta #3 osteoarthritis with chronic back pain. We've recommended increasing his tramadol 50 mg one to 2 tablets every 6 hours as needed. #4 hyperlipidemia. Recheck lipid and hepatic panel

## 2013-09-04 ENCOUNTER — Telehealth: Payer: Self-pay | Admitting: Family Medicine

## 2013-09-04 NOTE — Telephone Encounter (Signed)
emmi mailed to patient °

## 2013-09-18 ENCOUNTER — Ambulatory Visit
Admission: RE | Admit: 2013-09-18 | Discharge: 2013-09-18 | Disposition: A | Discharge status: Home / Self Care | Payer: Medicare Other | Source: Ambulatory Visit | Attending: Family Medicine | Admitting: Family Medicine

## 2013-09-18 DIAGNOSIS — I714 Abdominal aortic aneurysm, without rupture, unspecified: Secondary | ICD-10-CM

## 2013-09-24 ENCOUNTER — Telehealth: Payer: Self-pay | Admitting: Family Medicine

## 2013-09-24 MED ORDER — TRAMADOL HCL 50 MG PO TABS: 50.0000 mg | ORAL_TABLET | Freq: Four times a day (QID) | ORAL | Status: DC | PRN

## 2013-09-24 NOTE — Telephone Encounter (Signed)
Rx called into the pharmacy. 

## 2013-09-24 NOTE — Telephone Encounter (Signed)
Last visit 09/03/13 Last refill 04/08/13 #90 0 refill

## 2013-09-24 NOTE — Telephone Encounter (Signed)
Refill OK

## 2013-09-24 NOTE — Telephone Encounter (Signed)
Pt states he is trying to get off of hydros and diazepam (VALIUM) 5 MG tablet and dr Elease Hashimoto is aware. Pt states he would like a refill of traMADol (ULTRAM) 50 MG tablet  TAKE ONE TO TWO TABLETS EVERY 6 TO 8 HOURS AS NEEDED FOR PAIN. - [facility]  [facility] in [place]

## 2013-09-30 ENCOUNTER — Other Ambulatory Visit: Payer: Self-pay | Admitting: Family Medicine

## 2013-10-06 ENCOUNTER — Other Ambulatory Visit: Payer: Self-pay | Admitting: Family Medicine

## 2013-10-07 NOTE — Telephone Encounter (Signed)
Last visit 09/03/13 Last refill 09/24/13 #90 0 refill

## 2013-10-07 NOTE — Telephone Encounter (Signed)
Pt is just now picking up Rx that was called in on 09-24-13

## 2013-10-07 NOTE — Telephone Encounter (Signed)
This was just refilled on 5-28?

## 2013-10-09 ENCOUNTER — Telehealth: Payer: Self-pay | Admitting: Family Medicine

## 2013-10-09 NOTE — Telephone Encounter (Signed)
Pt called to request a rx on traMADol (ULTRAM) 50 MG tablet he said it is working for him he said he has been able to travel and see his family when before he started taking this he was not able to go anywhere .He said Dr Elease Hashimoto is a life saver  Pt said he would need a req for 180 pills  instead of 90 because he has to pay the same price

## 2013-10-09 NOTE — Telephone Encounter (Signed)
Last visit 09/03/13 Last refill 09/24/13 #90 0 refill

## 2013-10-11 NOTE — Telephone Encounter (Signed)
We can refill for increased # when due but not due yet if got #90 in late May.

## 2013-10-12 ENCOUNTER — Ambulatory Visit: Payer: Medicare Other | Admitting: Family Medicine

## 2013-10-23 ENCOUNTER — Telehealth: Payer: Self-pay | Admitting: Family Medicine

## 2013-10-23 MED ORDER — TRAMADOL HCL 50 MG PO TABS: ORAL_TABLET | ORAL | Status: DC

## 2013-10-23 NOTE — Telephone Encounter (Signed)
Pt aware that RX is ready for pick up  

## 2013-10-23 NOTE — Telephone Encounter (Signed)
Pt is aware that RX is ready for pick up. 

## 2013-10-23 NOTE — Telephone Encounter (Signed)
Pt states the pharmacy sent the rx late yesterday, however informed the pt to make dr. Elease Hashimoto aware that the rxtraMADol (ULTRAM) 50 MG tablet Should be 30 day supply which will be 180 tabs taking 6 tabs daily.

## 2013-10-24 ENCOUNTER — Other Ambulatory Visit: Payer: Self-pay | Admitting: Family Medicine

## 2013-11-02 ENCOUNTER — Other Ambulatory Visit: Payer: Self-pay | Admitting: Internal Medicine

## 2013-11-04 ENCOUNTER — Other Ambulatory Visit: Payer: Self-pay

## 2013-11-18 ENCOUNTER — Encounter: Payer: Self-pay | Admitting: Family Medicine

## 2013-11-18 ENCOUNTER — Ambulatory Visit (INDEPENDENT_AMBULATORY_CARE_PROVIDER_SITE_OTHER): Payer: Medicare Other | Admitting: Family Medicine

## 2013-11-18 VITALS — BP 142/72 | HR 60 | Temp 97.6°F | Wt 200.0 lb

## 2013-11-18 DIAGNOSIS — R5383 Other fatigue: Secondary | ICD-10-CM

## 2013-11-18 DIAGNOSIS — R5381 Other malaise: Secondary | ICD-10-CM

## 2013-11-18 DIAGNOSIS — R61 Generalized hyperhidrosis: Secondary | ICD-10-CM

## 2013-11-18 LAB — TSH: TSH: 0.36 u[IU]/mL (ref 0.35–4.50)

## 2013-11-18 NOTE — Patient Instructions (Signed)
We will call you regarding stress test.

## 2013-11-18 NOTE — Progress Notes (Signed)
Pre visit review using our clinic review tool, if applicable. No additional management support is needed unless otherwise documented below in the visit note. 

## 2013-11-18 NOTE — Progress Notes (Signed)
   Subjective:    Patient ID: Jerry Fox, male    DOB: January 01, 1934, 80 y.o.   MRN: [id number]  HPI Patient seen with complaints of diaphoresis episodes. These tend to occur mostly early in the morning but not consistently every day. His wife has noticed that he seems more fatigued lately. No associated chest pains or dyspnea. They're concerned because he has a twin brother that has history of bypass surgery and recent MI. Patient has noticed in general less exercise tolerance.  Denies any appetite or weight changes. No diarrhea. No tremors. No nausea or vomiting.  Chronic problems include hypertension, hyperlipidemia, atrial fibrillation, history of pulmonary embolus. No recent pleuritic pain. No cough.  Past Medical History  Diagnosis Date  . Arthritis   . Murmur, cardiac   . Hypertension   . Colon polyps   . Hyperlipidemia   . Atrial fibrillation 2/14    acute pulmonary embolus  . Saddle pulmonary embolus 2/14    LLE DVT  . AAA (abdominal aortic aneurysm) 2/14    3.8 cm 2/14   Past Surgical History  Procedure Laterality Date  . Tonsillectomy      reports that he quit smoking about 59 years ago. His smoking use included Cigarettes. He has a 10 pack-year smoking history. He quit smokeless tobacco use about 49 years ago. His smokeless tobacco use included Chew. He reports that he does not drink alcohol. His drug history is not on file. family history includes Arthritis in his father and mother; Heart disease in his father, mother, and paternal grandfather; Hypertension in his father. Allergies  Allergen Reactions  . Diltiazem Rash      Review of Systems  Constitutional: Positive for fatigue. Negative for fever, chills, appetite change and unexpected weight change.  Respiratory: Negative for cough and shortness of breath.   Cardiovascular: Negative for chest pain and palpitations.  Gastrointestinal: Negative for abdominal pain.  Genitourinary: Negative for dysuria.    Neurological: Negative for dizziness and syncope.  Hematological: Negative for adenopathy.       Objective:   Physical Exam  Constitutional: He is oriented to person, place, and time. He appears well-developed and well-nourished.  Neck:  No carotid bruits  Cardiovascular: Normal rate and regular rhythm.   Pulmonary/Chest: Effort normal and breath sounds normal. No respiratory distress. He has no wheezes. He has no rales.  Musculoskeletal: He exhibits no edema.  Neurological: He is alert and oriented to person, place, and time. No cranial nerve deficit.          Assessment & Plan:  Patient presents with nonspecific episodes of diaphoresis early-morning. He has not had any associated chest pains. Strong family history of CAD. He does have some exercise associated fatigue. Check TSH. Set up nuclear stress test

## 2013-11-21 ENCOUNTER — Other Ambulatory Visit: Payer: Self-pay | Admitting: Family Medicine

## 2013-11-21 ENCOUNTER — Other Ambulatory Visit: Payer: Self-pay | Admitting: Internal Medicine

## 2013-11-23 NOTE — Telephone Encounter (Signed)
Refill once 

## 2013-11-23 NOTE — Telephone Encounter (Signed)
Last visit 11/18/13 Last refill 10/23/13 #180 0 refill

## 2013-11-24 ENCOUNTER — Other Ambulatory Visit: Payer: Self-pay | Admitting: Family Medicine

## 2013-12-01 ENCOUNTER — Ambulatory Visit (HOSPITAL_COMMUNITY): Payer: Medicare Other | Attending: Family Medicine | Admitting: Radiology

## 2013-12-01 ENCOUNTER — Encounter: Payer: Self-pay | Admitting: Cardiology

## 2013-12-01 VITALS — BP 126/82 | HR 49 | Ht 70.0 in | Wt 197.0 lb

## 2013-12-01 DIAGNOSIS — Z86711 Personal history of pulmonary embolism: Secondary | ICD-10-CM | POA: Insufficient documentation

## 2013-12-01 DIAGNOSIS — I2510 Atherosclerotic heart disease of native coronary artery without angina pectoris: Secondary | ICD-10-CM

## 2013-12-01 DIAGNOSIS — R5381 Other malaise: Secondary | ICD-10-CM | POA: Diagnosis not present

## 2013-12-01 DIAGNOSIS — Z8249 Family history of ischemic heart disease and other diseases of the circulatory system: Secondary | ICD-10-CM | POA: Insufficient documentation

## 2013-12-01 DIAGNOSIS — R5383 Other fatigue: Secondary | ICD-10-CM

## 2013-12-01 DIAGNOSIS — R61 Generalized hyperhidrosis: Secondary | ICD-10-CM | POA: Insufficient documentation

## 2013-12-01 DIAGNOSIS — I252 Old myocardial infarction: Secondary | ICD-10-CM | POA: Insufficient documentation

## 2013-12-01 DIAGNOSIS — I4891 Unspecified atrial fibrillation: Secondary | ICD-10-CM

## 2013-12-01 MED ORDER — TECHNETIUM TC 99M SESTAMIBI GENERIC - CARDIOLITE
33.0000 | Freq: Once | INTRAVENOUS | Status: AC | PRN
  Administered 2013-12-01: 33 via INTRAVENOUS

## 2013-12-01 MED ORDER — REGADENOSON 0.4 MG/5ML IV SOLN
0.4000 mg | Freq: Once | INTRAVENOUS | Status: AC
  Administered 2013-12-01: 0.4 mg via INTRAVENOUS

## 2013-12-01 MED ORDER — TECHNETIUM TC 99M SESTAMIBI GENERIC - CARDIOLITE
11.0000 | Freq: Once | INTRAVENOUS | Status: AC | PRN
Start: 2013-12-01 — End: 2013-12-01
  Administered 2013-12-01: 11 via INTRAVENOUS

## 2013-12-01 NOTE — Progress Notes (Signed)
[facility] [address] [place], [place] [address] [phone]    Cardiology Nuclear Med Study  Jerry Fox is a 80 y.o. male     MRN : [id number]     DOB: Oct 19, 1933  Procedure Date: 12/01/2013  Nuclear Med Background Indication for Stress Test:  Evaluation for Ischemia History:  CAD, MI, Afib, Echo 2014 EF 55-60%, hx. PE Cardiac Risk Factors: Family History - CAD, History of Smoking, Hypertension, Lipids and PVD  Symptoms:  Diaphoresis and Fatigue   Nuclear Pre-Procedure Caffeine/Decaff Intake:  None NPO After: 7:00pm   Lungs:  clear O2 Sat: 96% on room air. IV 0.9% NS with Angio Cath:  22g  IV Site: R Hand  IV Started by:  Matilde Haymaker, RN  Chest Size (in):  42 Cup Size: n/a  Height: 5\' 10"  (1.778 m)  Weight:  197 lb (89.359 kg)  BMI:  Body mass index is 28.27 kg/(m^2). Tech Comments:  Metoprolol taken at 0600    Nuclear Med Study 1 or 2 day study: 1 day  Stress Test Type:  Lexiscan  Reading MD: n/a  Order Authorizing Provider:  Earnest Bailey   Resting Radionuclide: Technetium 61m Sestamibi  Resting Radionuclide Dose: 11.0 mCi   Stress Radionuclide:  Technetium 90m Sestamibi  Stress Radionuclide Dose: 33.0 mCi           Stress Protocol Rest HR: 49 Stress HR: 82  Rest BP: 126/82 Stress BP: 145/81  Exercise Time (min): n/a METS: n/a           Dose of Adenosine (mg):  n/a Dose of Lexiscan: 0.4 mg  Dose of Atropine (mg): n/a Dose of Dobutamine: n/a mcg/kg/min (at max HR)  Stress Test Technologist: Glade Lloyd, BS-ES  Nuclear Technologist:  Annye Rusk, CNMT     Rest Procedure:  Myocardial perfusion imaging was performed at rest 45 minutes following the intravenous administration of Technetium 35m Sestamibi. Rest ECG: Sinus bradycardia, LVH  Stress Procedure:  The patient received IV Lexiscan 0.4 mg over 15-seconds.  Technetium 29m Sestamibi injected at 30-seconds.  Quantitative spect images were obtained after a  45 minute delay. During the infusion of Lexiscan the patient complained of slight SOB and left sided flank pressure.  These symptoms resolved in recovery.  Stress ECG: No significant change from baseline ECG  QPS Raw Data Images:  Normal; no motion artifact; normal heart/lung ratio. Stress Images:  Normal homogeneous uptake in all areas of the myocardium. Rest Images:  Normal homogeneous uptake in all areas of the myocardium. Subtraction (SDS):  No evidence of ischemia. Transient Ischemic Dilatation (Normal <1.22):  0.96 Lung/Heart Ratio (Normal <0.45):  0.26  Quantitative Gated Spect Images QGS EDV:  91 ml QGS ESV:  43 ml  Impression Exercise Capacity:  Lexiscan with no exercise. BP Response:  Normal blood pressure response. Clinical Symptoms:  Mild chest pain/dyspnea. ECG Impression:  No significant ST segment change suggestive of ischemia. Comparison with Prior Nuclear Study: No previous nuclear study performed  Overall Impression:  Normal stress nuclear study.  LV Ejection Fraction: 52%.  LV Wall Motion:  NL LV Function; NL Wall Motion   Dorothy Spark 12/01/2013

## 2013-12-07 ENCOUNTER — Other Ambulatory Visit: Payer: Self-pay | Admitting: Family Medicine

## 2013-12-23 ENCOUNTER — Other Ambulatory Visit: Payer: Self-pay | Admitting: Family Medicine

## 2013-12-23 NOTE — Telephone Encounter (Signed)
Last visit 11/18/13 Last refill 11/23/13 #180 0 refill

## 2013-12-23 NOTE — Telephone Encounter (Signed)
Refill OK.  Try tapering back if possible.

## 2013-12-24 ENCOUNTER — Other Ambulatory Visit: Payer: Self-pay

## 2013-12-24 MED ORDER — HYDROCHLOROTHIAZIDE 25 MG PO TABS: ORAL_TABLET | ORAL | Status: DC

## 2013-12-24 NOTE — Telephone Encounter (Signed)
Refill HCTZ

## 2014-01-27 ENCOUNTER — Other Ambulatory Visit: Payer: Self-pay | Admitting: Family Medicine

## 2014-01-27 NOTE — Telephone Encounter (Signed)
Last visit 11/18/13 Last refill 12/23/13 #180 0 refill

## 2014-01-27 NOTE — Telephone Encounter (Signed)
Refill OK

## 2014-02-26 ENCOUNTER — Other Ambulatory Visit: Payer: Self-pay | Admitting: Family Medicine

## 2014-02-26 NOTE — Telephone Encounter (Signed)
Last visit 11/18/13 Last refill 01/27/14 #180 0 refill

## 2014-02-26 NOTE — Telephone Encounter (Signed)
Refill OK.  i would like for him to try to scale back use if possible.

## 2014-03-03 ENCOUNTER — Encounter: Payer: Self-pay | Admitting: Family Medicine

## 2014-03-03 ENCOUNTER — Ambulatory Visit (INDEPENDENT_AMBULATORY_CARE_PROVIDER_SITE_OTHER): Payer: Medicare Other | Admitting: Family Medicine

## 2014-03-03 ENCOUNTER — Ambulatory Visit (INDEPENDENT_AMBULATORY_CARE_PROVIDER_SITE_OTHER): Payer: Medicare Other

## 2014-03-03 VITALS — BP 140/70 | HR 52 | Temp 97.3°F | Wt 201.0 lb

## 2014-03-03 DIAGNOSIS — Z23 Encounter for immunization: Secondary | ICD-10-CM

## 2014-03-03 DIAGNOSIS — M549 Dorsalgia, unspecified: Secondary | ICD-10-CM

## 2014-03-03 DIAGNOSIS — M47816 Spondylosis without myelopathy or radiculopathy, lumbar region: Secondary | ICD-10-CM

## 2014-03-03 NOTE — Progress Notes (Signed)
Subjective:    Patient ID: Jerry Fox, male    DOB: May 19, 1933, 80 y.o.   MRN: [id number]  HPI 80 year old male presents today for follow up on diaphoretic episodes.  He denies any recent events of diaphoresis.  Denies chest pains, SOB, edema in the extremities.  Takes xarelto everyday to prevent a recurrent PE.   Today he complains of back pain that awakens him in the middle of the night.  Has been an issue for several weeks.  Pain is bilateral and diffusely down the back.  States he has degenerating discs as well and he can tell that this is not the same pain; but this is his arthritic pains.  He takes tramadol TID and is now needing hydrocodone in the middle of the night.  He is also taking diazepam as a muscle relaxer.  Denies recent fevers, SOB, chest pains, weakness, numbness or tingling.  Denies weight changes/ change in appetite, history of cancers or loss of bowel/ bladder function.    Review of Systems  Constitutional: Negative for fever, activity change, appetite change and fatigue.  Respiratory: Negative for cough, choking, chest tightness, shortness of breath and wheezing.   Cardiovascular: Negative for chest pain and leg swelling.  Musculoskeletal: Positive for back pain. Negative for joint swelling, arthralgias and gait problem.  Skin: Negative for rash.  Neurological: Negative for dizziness, weakness, light-headedness, numbness and headaches.   Past Medical History  Diagnosis Date  . Arthritis   . Murmur, cardiac   . Hypertension   . Colon polyps   . Hyperlipidemia   . Atrial fibrillation 2/14    acute pulmonary embolus  . Saddle pulmonary embolus 2/14    LLE DVT  . AAA (abdominal aortic aneurysm) 2/14    3.8 cm 2/14   Current Outpatient Prescriptions on File Prior to Visit  Medication Sig Dispense Refill  . diazepam (VALIUM) 5 MG tablet Take 0.5-1 tablets (2.5-5 mg total) by mouth 3 (three) times daily as needed (for anxiety; 2.5 mg every morning and at noon  and 5 mg at bedtime). 60 tablet 5  . hydrochlorothiazide (HYDRODIURIL) 25 MG tablet TAKE ONE (1) TABLET EACH DAY 30 tablet 6  . HYDROcodone-acetaminophen (NORCO/VICODIN) 5-325 MG per tablet Take 1 tablet by mouth 3 (three) times daily as needed for pain (for pain). 90 tablet 0  . lisinopril (PRINIVIL,ZESTRIL) 10 MG tablet Take 1 tablet (10 mg total) by mouth daily. 90 tablet 3  . lovastatin (MEVACOR) 20 MG tablet Take 1 tablet (20 mg total) by mouth at bedtime. 90 tablet 3  . metoprolol (LOPRESSOR) 50 MG tablet TAKE ONE TABLET BY MOUTH TWICE DAILY 180 tablet 3  . polyethylene glycol (MIRALAX / GLYCOLAX) packet Take 17 g by mouth 2 (two) times daily as needed.    . senna-docusate (SENOKOT-S) 8.6-50 MG per tablet Take 1 tablet by mouth 2 (two) times daily as needed.    . traMADol (ULTRAM) 50 MG tablet TAKE 1 TO 2 TABLETS EVERY 6 TO 8 HOURS AS NEEDED FOR PAIN 180 tablet 0  . XARELTO 20 MG TABS tablet TAKE 1 TABLET DAILY WITH SUPPER 30 tablet 5   No current facility-administered medications on file prior to visit.   Family History  Problem Relation Age of Onset  . Arthritis Mother   . Heart disease Mother   . Arthritis Father   . Heart disease Father   . Hypertension Father   . Heart disease Paternal Grandfather    History  Social History  . Marital Status: Married    Spouse Name: N/A    Number of Children: N/A  . Years of Education: N/A   Occupational History  . Not on file.   Social History Main Topics  . Smoking status: Former Smoker -- 1.00 packs/day for 10 years    Types: Cigarettes    Quit date: 01/31/1954  . Smokeless tobacco: Former Systems developer    Types: [place] date: 02/01/1964  . Alcohol Use: No  . Drug Use: Not on file  . Sexual Activity: Not on file   Other Topics Concern  . Not on file   Social History Narrative      Objective:   Physical Exam  Constitutional: He is oriented to person, place, and time. He appears well-developed and well-nourished. No  distress.  Cardiovascular: Normal rate, regular rhythm and normal heart sounds.   No murmur heard. Pulmonary/Chest: Effort normal and breath sounds normal. No respiratory distress. He has no wheezes.  Musculoskeletal: Normal range of motion. He exhibits no edema or tenderness (No point tenderness localized ).  Neurological: He is alert and oriented to person, place, and time.  Skin: He is not diaphoretic.  Psychiatric: He has a normal mood and affect. His behavior is normal. Judgment and thought content normal.  Nursing note and vitals reviewed.       Assessment & Plan:  1. Back pain- Continue taking tramadol and hydrocodone for pain.  Educated patient that max dose for tramadol is 400 mg per day, so he can take up to one more dose each day as needed, 6 hours apart from previous dose.  Imaging has already been done proving there are changes.  This pain seems to be more musculoskeletal.  Patient is agreeable to trying physical therapy to see if he can gain relief.    Jerry Art, PA-S   As above. Patient has diffuse pain radiating from his upper thoracic all the way to the lumbar region. No spinal involvement. His pain is bilateral and seems to be more muscular. We've recommended avoiding regular use of opioids and also regular use of muscle relaxers at his age. Trial of physical therapy and referral made.Carolann Littler M.D.

## 2014-03-03 NOTE — Progress Notes (Signed)
Pre visit review using our clinic review tool, if applicable. No additional management support is needed unless otherwise documented below in the visit note. 

## 2014-03-03 NOTE — Patient Instructions (Signed)
Back Pain, Adult Low back pain is very common. About 1 in 5 people have back pain.The cause of low back pain is rarely dangerous. The pain often gets better over time.About half of people with a sudden onset of back pain feel better in just 2 weeks. About 8 in 10 people feel better by 6 weeks.  CAUSES Some common causes of back pain include:  Strain of the muscles or ligaments supporting the spine.  Wear and tear (degeneration) of the spinal discs.  Arthritis.  Direct injury to the back. DIAGNOSIS Most of the time, the direct cause of low back pain is not known.However, back pain can be treated effectively even when the exact cause of the pain is unknown.Answering your caregiver's questions about your overall health and symptoms is one of the most accurate ways to make sure the cause of your pain is not dangerous. If your caregiver needs more information, he or she may order lab work or imaging tests (X-rays or MRIs).However, even if imaging tests show changes in your back, this usually does not require surgery. HOME CARE INSTRUCTIONS For many people, back pain returns.Since low back pain is rarely dangerous, it is often a condition that people can learn to manageon their own.   Remain active. It is stressful on the back to sit or stand in one place. Do not sit, drive, or stand in one place for more than 30 minutes at a time. Take short walks on level surfaces as soon as pain allows.Try to increase the length of time you walk each day.  Do not stay in bed.Resting more than 1 or 2 days can delay your recovery.  Do not avoid exercise or work.Your body is made to move.It is not dangerous to be active, even though your back may hurt.Your back will likely heal faster if you return to being active before your pain is gone.  Pay attention to your body when you bend and lift. Many people have less discomfortwhen lifting if they bend their knees, keep the load close to their bodies,and  avoid twisting. Often, the most comfortable positions are those that put less stress on your recovering back.  Find a comfortable position to sleep. Use a firm mattress and lie on your side with your knees slightly bent. If you lie on your back, put a pillow under your knees.  Only take over-the-counter or prescription medicines as directed by your caregiver. Over-the-counter medicines to reduce pain and inflammation are often the most helpful.Your caregiver may prescribe muscle relaxant drugs.These medicines help dull your pain so you can more quickly return to your normal activities and healthy exercise.  Put ice on the injured area.  Put ice in a plastic bag.  Place a towel between your skin and the bag.  Leave the ice on for 15-20 minutes, 03-04 times a day for the first 2 to 3 days. After that, ice and heat may be alternated to reduce pain and spasms.  Ask your caregiver about trying back exercises and gentle massage. This may be of some benefit.  Avoid feeling anxious or stressed.Stress increases muscle tension and can worsen back pain.It is important to recognize when you are anxious or stressed and learn ways to manage it.Exercise is a great option. SEEK MEDICAL CARE IF:  You have pain that is not relieved with rest or medicine.  You have pain that does not improve in 1 week.  You have new symptoms.  You are generally not feeling well. SEEK   IMMEDIATE MEDICAL CARE IF:   You have pain that radiates from your back into your legs.  You develop new bowel or bladder control problems.  You have unusual weakness or numbness in your arms or legs.  You develop nausea or vomiting.  You develop abdominal pain.  You feel faint. Document Released: 04/16/2005 Document Revised: 10/16/2011 Document Reviewed: 08/18/2013 ExitCare Patient Information 2015 [employer]. This information is not intended to replace advice given to you by your health care provider. Make sure you  discuss any questions you have with your health care provider.  

## 2014-03-05 ENCOUNTER — Telehealth: Payer: Self-pay | Admitting: Family Medicine

## 2014-03-05 NOTE — Telephone Encounter (Signed)
Pt wife is calling to found out does her hus has aneurysm in stomach . Please confirm

## 2014-03-07 NOTE — Telephone Encounter (Signed)
He does have infrarenal aortic aneurysm and needs repeat abdominal ultrasound to reassess in May of 2016 (last Ultrasound 5/15)

## 2014-03-08 ENCOUNTER — Ambulatory Visit: Payer: Medicare Other | Admitting: Family Medicine

## 2014-03-08 NOTE — Telephone Encounter (Signed)
Pt informed

## 2014-03-11 ENCOUNTER — Ambulatory Visit: Payer: Medicare Other | Admitting: Physical Therapy

## 2014-04-02 ENCOUNTER — Other Ambulatory Visit: Payer: Self-pay | Admitting: Family Medicine

## 2014-04-05 NOTE — Telephone Encounter (Signed)
Refill with 2 additional refills. 

## 2014-04-05 NOTE — Telephone Encounter (Signed)
Last visit 02/26/14 Last refill 03/03/14 #180 0 refill

## 2014-04-28 ENCOUNTER — Other Ambulatory Visit: Payer: Self-pay | Admitting: Family Medicine

## 2014-05-26 ENCOUNTER — Other Ambulatory Visit: Payer: Self-pay | Admitting: Family Medicine

## 2014-06-08 ENCOUNTER — Other Ambulatory Visit: Payer: Self-pay | Admitting: Family Medicine

## 2014-06-08 NOTE — Telephone Encounter (Signed)
Refill for 3 months. 

## 2014-06-08 NOTE — Telephone Encounter (Signed)
Last visit 03/08/14 Last refill 04/06/14 #180 2 refill

## 2014-07-24 ENCOUNTER — Other Ambulatory Visit: Payer: Self-pay | Admitting: Family Medicine

## 2014-08-25 ENCOUNTER — Telehealth: Payer: Self-pay

## 2014-08-25 NOTE — Telephone Encounter (Signed)
Called patient to see if he request a refill on HCTZ and he did but Dr Elease Hashimoto refilled it. He states that he will not be coming back to see Dr Caryl Comes not unless Dr Elease Hashimoto sends him. So I let him know that all of his medication will have to come from him

## 2014-09-20 ENCOUNTER — Other Ambulatory Visit: Payer: Self-pay | Admitting: Family Medicine

## 2014-09-20 NOTE — Telephone Encounter (Signed)
Refill with 2 additional refills. 

## 2014-09-20 NOTE — Telephone Encounter (Signed)
Last visit 03/03/14 Last refill 06/08/14 #180 2 refills

## 2014-11-02 ENCOUNTER — Telehealth: Payer: Self-pay | Admitting: Family Medicine

## 2014-11-02 NOTE — Telephone Encounter (Signed)
[facility] - Client [place] Patient Name: Jerry Fox DOB: 03-31-1934 Initial Comment Caller wants to make an apt for her husband has a red rash on his hip and pelvis. He is on blood thinners Nurse Assessment Nurse: Malva Cogan, RN, Juliann Pulse Date/Time Eilene Ghazi Time): 11/02/2014 3:28:20 PM Confirm and document reason for call. If symptomatic, describe symptoms. ---Caller states that her sp has had a flat red rash on both of his hips & pelvic area for past couple of wks, is not itchy or painful. Is on Xarelto, rash does not look like bruising. Caller is wanting to make an appt for her sp to be examined. Has the patient traveled out of the country within the last 30 days? ---No Does the patient require triage? ---Yes Related visit to physician within the last 2 weeks? ---No Does the PT have any chronic conditions? (i.e. diabetes, asthma, etc.) ---Yes List chronic conditions. ---H/O blood clots x 4, aortic aneurysm, HTN, high cholesterol Guidelines Guideline Title Affirmed Question Affirmed Notes Rash or Redness - Widespread Mild widespread rash Final Disposition User See PCP When Office is Open (within 3 days) Malva Cogan, Therapist, sports, [employer] failed to enter reason for acute appt, called back line at office & spoke with Danae Chen who was advised of apt date & time & reason for appt & Danae Chen entered info in record for triager.

## 2014-11-02 NOTE — Telephone Encounter (Signed)
Patient has appointment to see MD [facility] Thursday

## 2014-11-04 ENCOUNTER — Encounter: Payer: Self-pay | Admitting: Family Medicine

## 2014-11-04 ENCOUNTER — Ambulatory Visit (INDEPENDENT_AMBULATORY_CARE_PROVIDER_SITE_OTHER): Payer: Medicare Other | Admitting: Family Medicine

## 2014-11-04 VITALS — BP 130/78 | HR 61 | Temp 97.6°F | Wt 210.0 lb

## 2014-11-04 DIAGNOSIS — R21 Rash and other nonspecific skin eruption: Secondary | ICD-10-CM | POA: Diagnosis not present

## 2014-11-04 NOTE — Progress Notes (Signed)
   Subjective:    Patient ID: Jerry Fox, male    DOB: [birth year], 81 y.o.   MRN: [id number]  HPI Patient seen with 2 to three-week history of skin rash. Intermittent. Mostly involves his buttocks. He had very transient rash left chin region which appeared similar. Nonscaly. Nonpustular. No pain or itching. He has felt very sweaty recently and spends most of his day sitting. Heat seems to exacerbate. No alleviating factors. No recent change of medications. He denies any generalized rash.   Review of Systems  Constitutional: Negative for fever and chills.  Skin: Positive for rash.       Objective:   Physical Exam  Constitutional: He appears well-developed and well-nourished.  Cardiovascular: Normal rate and regular rhythm.   Pulmonary/Chest: Effort normal and breath sounds normal. No respiratory distress. He has no wheezes. He has no rales.  Skin: Rash noted.  Patient has macular rash minimally erythematous on the buttocks. Somewhat irregular distribution of bilateral nonscaly. No pustules. Nontender. Blanches with pressure.          Assessment & Plan:  Skin rash. Question heat-related rash. This appears to be more vascular and blanches with pressure. We recommended boxer shorts and avoid prolonged periods of sitting. Observe for now. Follow-up if any spread or worsening symptoms. He needs to schedule follow-up for labs as none in over year

## 2014-11-04 NOTE — Progress Notes (Signed)
Pre visit review using our clinic review tool, if applicable. No additional management support is needed unless otherwise documented below in the visit note. 

## 2014-11-18 ENCOUNTER — Other Ambulatory Visit (INDEPENDENT_AMBULATORY_CARE_PROVIDER_SITE_OTHER): Payer: Medicare Other

## 2014-11-18 DIAGNOSIS — Z0000 Encounter for general adult medical examination without abnormal findings: Secondary | ICD-10-CM

## 2014-11-18 DIAGNOSIS — E785 Hyperlipidemia, unspecified: Secondary | ICD-10-CM

## 2014-11-18 LAB — CBC WITH DIFFERENTIAL/PLATELET
BASOS ABS: 0 10*3/uL (ref 0.0–0.1)
Basophils Relative: 0.4 % (ref 0.0–3.0)
Eosinophils Absolute: 0.1 10*3/uL (ref 0.0–0.7)
Eosinophils Relative: 1.1 % (ref 0.0–5.0)
HCT: 42.3 % (ref 39.0–52.0)
Hemoglobin: 14.1 g/dL (ref 13.0–17.0)
Lymphocytes Relative: 24.9 % (ref 12.0–46.0)
Lymphs Abs: 2.2 10*3/uL (ref 0.7–4.0)
MCHC: 33.4 g/dL (ref 30.0–36.0)
MCV: 94 fl (ref 78.0–100.0)
MONO ABS: 0.7 10*3/uL (ref 0.1–1.0)
MONOS PCT: 8 % (ref 3.0–12.0)
Neutro Abs: 5.8 10*3/uL (ref 1.4–7.7)
Neutrophils Relative %: 65.6 % (ref 43.0–77.0)
Platelets: 263 10*3/uL (ref 150.0–400.0)
RBC: 4.5 Mil/uL (ref 4.22–5.81)
RDW: 13.4 % (ref 11.5–15.5)
WBC: 8.8 10*3/uL (ref 4.0–10.5)

## 2014-11-18 LAB — BASIC METABOLIC PANEL
BUN: 17 mg/dL (ref 6–23)
CHLORIDE: 99 meq/L (ref 96–112)
CO2: 33 meq/L — AB (ref 19–32)
CREATININE: 0.96 mg/dL (ref 0.40–1.50)
Calcium: 9.5 mg/dL (ref 8.4–10.5)
GFR: 80.32 mL/min (ref >60.00)
Glucose, Bld: 103 mg/dL — ABNORMAL HIGH (ref 70–99)
Potassium: 5.2 mEq/L — ABNORMAL HIGH (ref 3.5–5.1)
Sodium: 138 mEq/L (ref 135–145)

## 2014-11-18 LAB — HEPATIC FUNCTION PANEL
ALBUMIN: 3.9 g/dL (ref 3.5–5.2)
ALT: 12 U/L (ref 0–53)
AST: 15 U/L (ref 0–37)
Alkaline Phosphatase: 62 U/L (ref 39–117)
BILIRUBIN TOTAL: 0.7 mg/dL (ref 0.2–1.2)
Bilirubin, Direct: 0.1 mg/dL (ref 0.0–0.3)
Total Protein: 6.7 g/dL (ref 6.0–8.3)

## 2014-11-18 LAB — LIPID PANEL
CHOL/HDL RATIO: 3
Cholesterol: 142 mg/dL (ref 0–200)
HDL: 43 mg/dL (ref >39.00)
LDL CALC: 77 mg/dL (ref 0–99)
NonHDL: 99
TRIGLYCERIDES: 108 mg/dL (ref 0.0–149.0)
VLDL: 21.6 mg/dL (ref 0.0–40.0)

## 2014-11-18 LAB — PSA: PSA: 2 ng/mL (ref 0.10–4.00)

## 2014-11-18 LAB — TSH: TSH: 1.33 u[IU]/mL (ref 0.35–4.50)

## 2014-11-19 ENCOUNTER — Other Ambulatory Visit: Payer: Self-pay | Admitting: Family Medicine

## 2014-11-22 ENCOUNTER — Telehealth: Payer: Self-pay | Admitting: Family Medicine

## 2014-11-22 NOTE — Telephone Encounter (Signed)
Noted  

## 2014-11-22 NOTE — Telephone Encounter (Signed)
ASE NOTE: All timestamps contained within this report are represented as [place] Standard Time. CONFIDENTIALTY NOTICE: This fax transmission is intended only for the addressee. It contains information that is legally privileged, confidential or otherwise protected from use or disclosure. If you are not the intended recipient, you are strictly prohibited from reviewing, disclosing, copying using or disseminating any of this information or taking any action in reliance on or regarding this information. If you have received this fax in error, please notify us immediately by telephone so that we can arrange for its return to [place]. Phone: [phone], Toll-Free: [phone], Fax: [phone] Page: 1 of 1 Call Id: [id number] [facility] - Client [place] Patient Name: Jerry Fox Gender: Male DOB: 08-19-1933 Age: 81 Y 11 M 8 D Return Phone Number: [phone] (Primary) Address: City/State/Zip: [place] [address] Client [facility] - Client Client [facility] - Day Physician Carolann Littler Contact Type Call Call Type Triage / Clinical Caller Name Maven Varelas Relationship To Patient Spouse Return Phone Number [phone] (Primary) Chief Complaint Abdominal Pain Initial Comment Caller states husband c/o abdominal pain Nurse Assessment Guidelines Guideline Title Affirmed Question Affirmed Notes Nurse Date/Time ([employer] Time) Disp. Time Eilene Ghazi Time) Disposition Final User 11/22/2014 2:46:00 PM Send To Clinical Follow Up Rich Brave, Amy After Care Instructions Given Call Event Type User Date / Time Description Comments User: Susanne Borders, RN Date/Time Eilene Ghazi Time): 11/22/2014 3:04:24 PM SPOUSE STATES THAT WHEN HE EATS HIS STOMACH WOULD HURT. HE COMPLAINED OF STOMACH FOR A WEEK. FRIDAY HE COMPLAINED OF STOMACH HURTING AND HE ATE SOME SEAFOOD AROUND BELLY BUTTON  INTO RIGHT SIDE. CONTINUED THROUGHT OUT THE WEEKEND. HE HAS CONTINUED TO HURT A 3/10 ON PAIN SCALE. ABOUT 15 YEARS AGO HE HAD THIS PAIN HE HAD AN INFLAMMATION IN THE WALL AND HE TOOK ANTIBIOTICS. THEY HAVE AN APPT ON THURSDAY WITH DR. Elease Hashimoto. THE PAIN IS INTERMITTENT. HE STATES THAT IT IS DOING GOOD AND WILL NOT MAKE AN APPT AS HE HAS ONE ALREADY ON THURSDAY. User: Susanne Borders, RN Date/Time Eilene Ghazi Time): 11/22/2014 3:04:58 PM HE WOULD NOT ALLOW AN APPT TO BE MADE OR ANY FURTHER ASSISTANCE - SPOUSE TRIED.

## 2014-11-25 ENCOUNTER — Encounter: Payer: Self-pay | Admitting: Family Medicine

## 2014-11-25 ENCOUNTER — Ambulatory Visit (INDEPENDENT_AMBULATORY_CARE_PROVIDER_SITE_OTHER): Payer: Medicare Other | Admitting: Family Medicine

## 2014-11-25 VITALS — BP 130/70 | Temp 97.4°F | Ht 68.5 in | Wt 209.0 lb

## 2014-11-25 DIAGNOSIS — Z23 Encounter for immunization: Secondary | ICD-10-CM | POA: Diagnosis not present

## 2014-11-25 DIAGNOSIS — Z0000 Encounter for general adult medical examination without abnormal findings: Secondary | ICD-10-CM

## 2014-11-25 DIAGNOSIS — R109 Unspecified abdominal pain: Secondary | ICD-10-CM

## 2014-11-25 DIAGNOSIS — I714 Abdominal aortic aneurysm, without rupture, unspecified: Secondary | ICD-10-CM

## 2014-11-25 NOTE — Progress Notes (Signed)
Subjective:    Patient ID: Jerry Fox, male    DOB: [birth year], 81 y.o.   MRN: [id number]  HPI Patient seen for complete physical. Chronic problems include history of osteoarthritis, hypertension, mild hyperlipidemia, chronic back pain, pulmonary embolus, transient atrial fibrillation, abdominal aortic aneurysm. Last abdominal aortic ultrasound was May 2015. His blood pressure has been well controlled. Medications reviewed and compliant with all. He takes lovastatin for hyperlipidemia. No history of CAD. He remains on anticoagulant with Xarelto.  He had last colonoscopy reportedly 2004. Last tetanus over 10 years ago. No history of Prevnar 13.  He does have history of some chronic intermittent constipation and frequently takes MiraLAX. He just recently had some occasional fleeting pains right mid abdominal area frequently after eating. He describes a 5 out of 10 pain which is dull and radiates slightly toward the back. Currently pain-free. Has never had any fevers or chills. No recent stool changes. He has never had any right upper quadrant pain.  Past Medical History  Diagnosis Date  . Arthritis   . Murmur, cardiac   . Hypertension   . Colon polyps   . Hyperlipidemia   . Atrial fibrillation 2/14    acute pulmonary embolus  . Saddle pulmonary embolus 2/14    LLE DVT  . AAA (abdominal aortic aneurysm) 2/14    3.8 cm 2/14   Past Surgical History  Procedure Laterality Date  . Tonsillectomy      reports that he quit smoking about 60 years ago. His smoking use included Cigarettes. He has a 10 pack-year smoking history. He quit smokeless tobacco use about 50 years ago. His smokeless tobacco use included Chew. He reports that he does not drink alcohol. His drug history is not on file. family history includes Arthritis in his father and mother; Heart disease in his father, mother, and paternal grandfather; Hypertension in his father. Allergies  Allergen Reactions  . Diltiazem Rash        Review of Systems  Constitutional: Negative for fever, chills, activity change, appetite change, fatigue and unexpected weight change.  HENT: Negative for congestion, ear pain and trouble swallowing.   Eyes: Negative for pain and visual disturbance.  Respiratory: Negative for cough, shortness of breath and wheezing.   Cardiovascular: Negative for chest pain and palpitations.  Gastrointestinal: Negative for nausea, vomiting, abdominal pain, diarrhea, constipation, blood in stool, abdominal distention and rectal pain.  Endocrine: Negative for polydipsia and polyuria.  Genitourinary: Negative for dysuria, hematuria and testicular pain.  Musculoskeletal: Positive for back pain and arthralgias. Negative for joint swelling.  Skin: Negative for rash.  Neurological: Negative for dizziness, syncope, weakness and headaches.  Hematological: Negative for adenopathy.  Psychiatric/Behavioral: Negative for confusion and dysphoric mood.       Objective:   Physical Exam  Constitutional: He is oriented to person, place, and time. He appears well-developed and well-nourished. No distress.  HENT:  Head: Normocephalic and atraumatic.  Right Ear: External ear normal.  Left Ear: External ear normal.  Mouth/Throat: Oropharynx is clear and moist.  Eyes: Conjunctivae and EOM are normal. Pupils are equal, round, and reactive to light.  Neck: Normal range of motion. Neck supple. No thyromegaly present.  Cardiovascular: Normal rate, regular rhythm and normal heart sounds.   No murmur heard. Pulmonary/Chest: No respiratory distress. He has no wheezes. He has no rales.  Abdominal: Soft. Bowel sounds are normal. He exhibits no distension and no mass. There is no tenderness. There is no rebound and no guarding.  Genitourinary:  Prostate minimally enlarged. No rectal mass.  Musculoskeletal: He exhibits no edema.  Lymphadenopathy:    He has no cervical adenopathy.  Neurological: He is alert and oriented  to person, place, and time. He displays normal reflexes. No cranial nerve deficit.  Skin: No rash noted.  Couple scattered seborrheic keratoses on the back. No concerning lesions.  Psychiatric: He has a normal mood and affect.          Assessment & Plan:  #1 health maintenance. Tetanus booster given. Prevnar 13 given. We discussed repeat colonoscopy versus cologuard and he is undecided but will check with insurance regarding coverage as a first step. Recommend yearly flu vaccine. Recent labs reviewed with patient with no major concerns. Minimally elevated potassium- probably hemolyzed. He does not take any potassium supplement #2 abdominal aortic aneurysm. 4.2 by last assessment over one year ago. Set up repeat ultrasound aorta #3 hypertension stable and well controlled #4 recent intermittent right mid quadrant abdominal pain. Question related to constipation. We discussed possible ultrasound but he is not interested at this time. Doubt gallstones with location. Follow-up if symptoms not relieved with treatment of constipation and sooner if he develops any fever, worsening pain, or other concerns

## 2014-11-25 NOTE — Progress Notes (Signed)
Pre visit review using our clinic review tool, if applicable. No additional management support is needed unless otherwise documented below in the visit note. 

## 2014-11-25 NOTE — Patient Instructions (Signed)
Continue with yearly flu vaccine We will call you regarding ultrasound aorta Check on insurance coverage for Cologuard

## 2014-12-10 IMAGING — US US AORTA
1 series · 14 of 25 positions shown · non-contrast
Comparison: 06/18/2012

CLINICAL DATA: Abdominal aortic aneurysm without rupture.

EXAM:
ULTRASOUND OF ABDOMINAL AORTA
TECHNIQUE: Ultrasound examination of the abdominal aorta was performed to
evaluate for abdominal aortic aneurysm.

[Series 1: us aorta · 0.35mm/px · 14 of 31 slices shown]
[im 1/31]
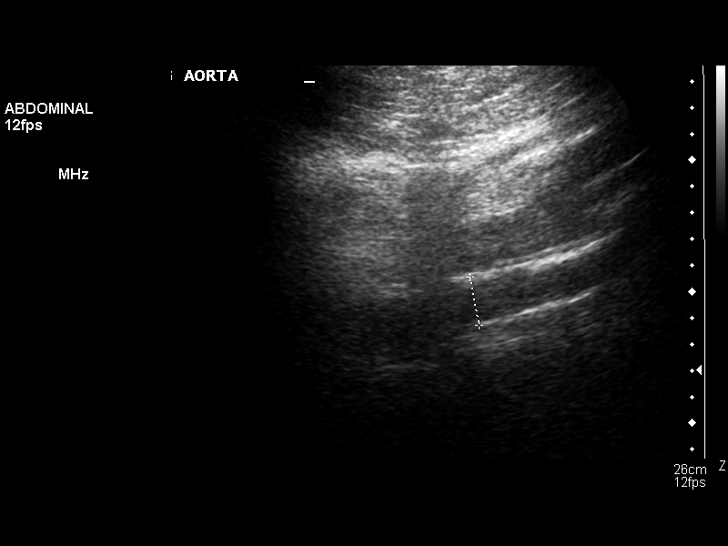
[im 3/31]
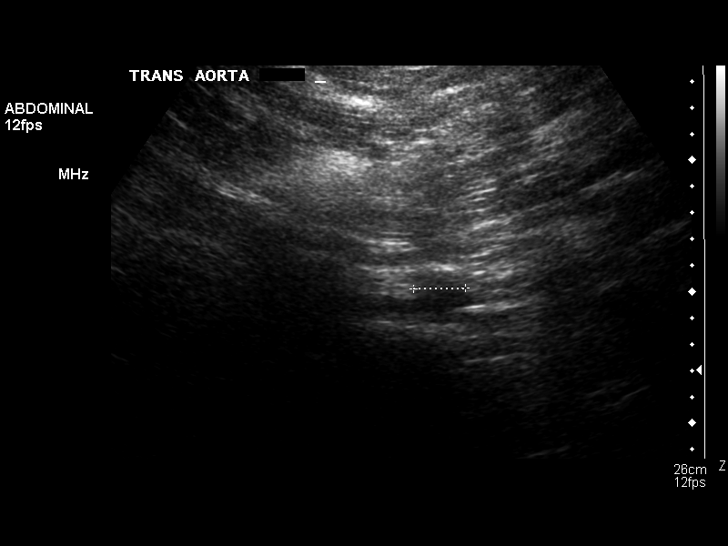
[im 6/31]
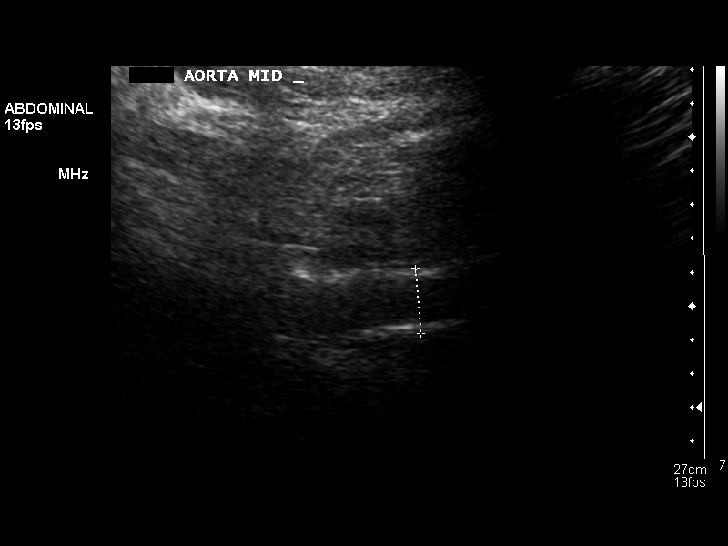
[im 8/31]
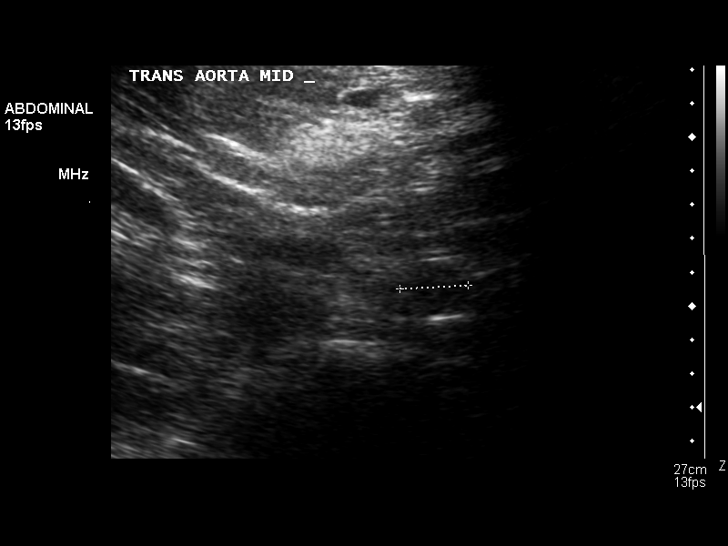
[im 11/31]
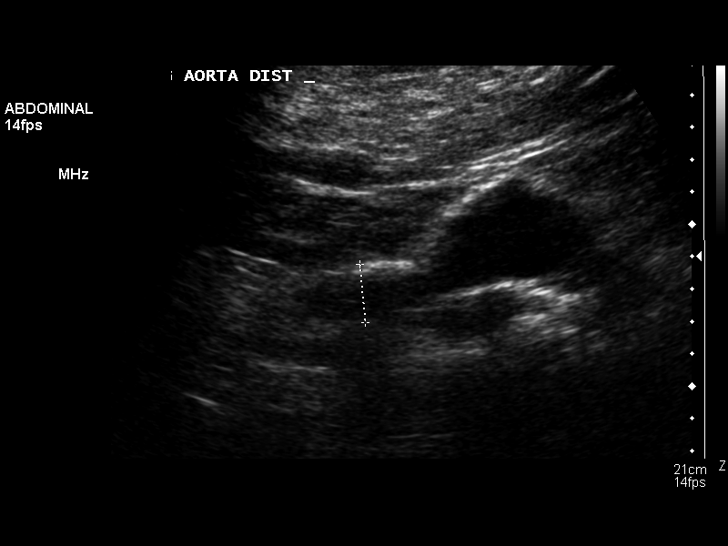
[im 12/31]
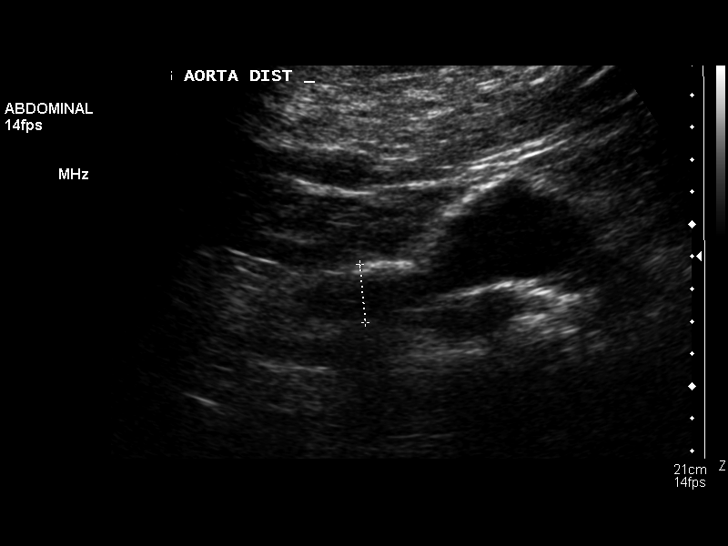
[im 14/31]
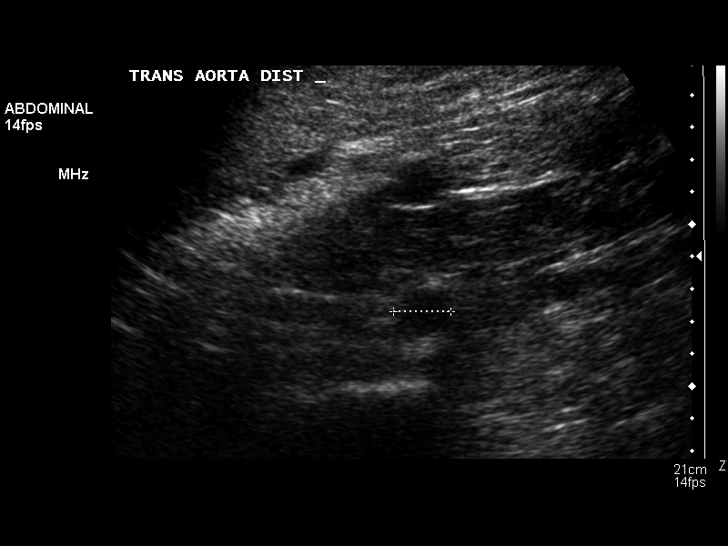
[im 17/31]
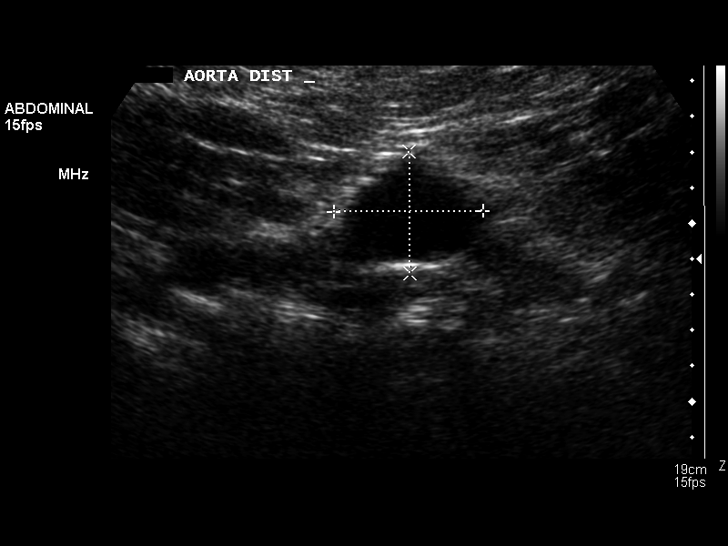
[im 19/31]
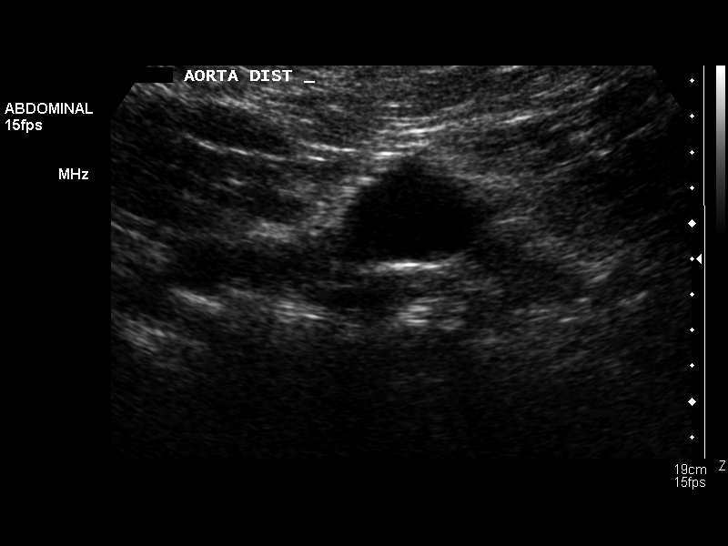
[im 21/31]
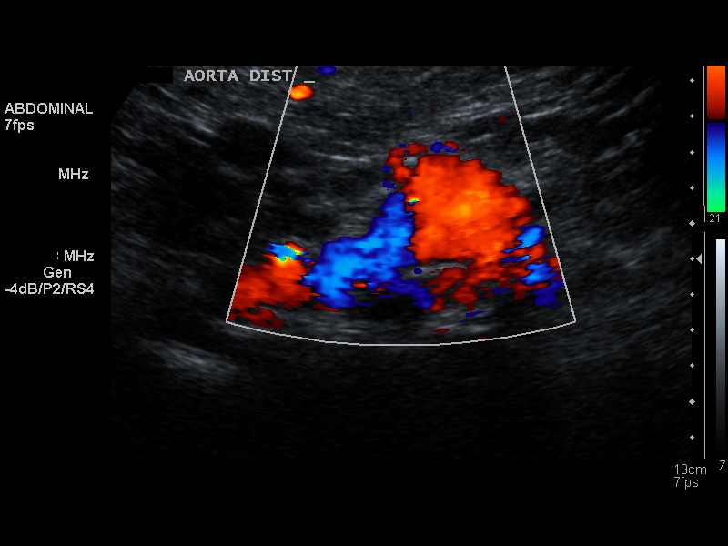
[im 23/31]
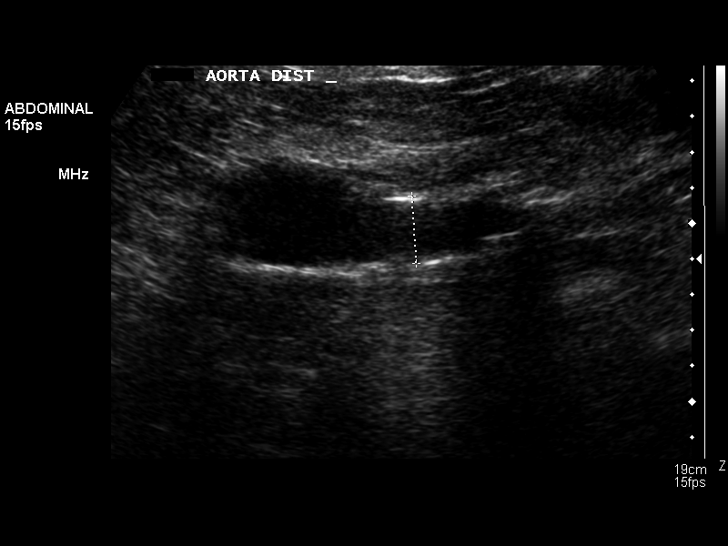
[im 26/31]
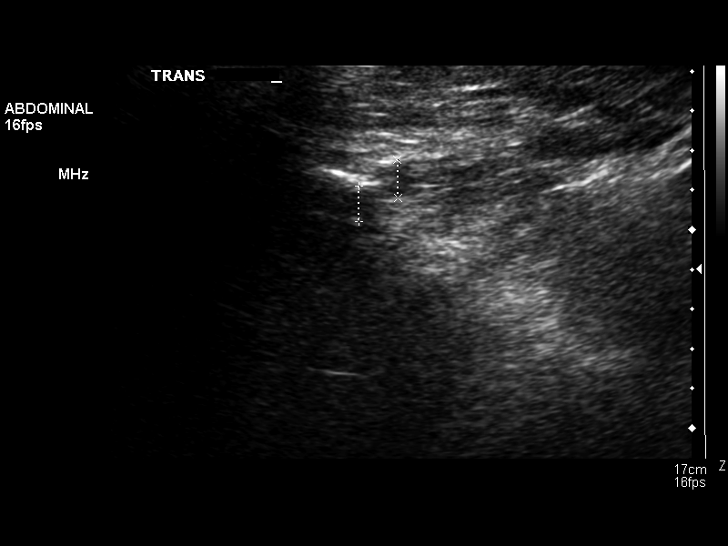
[im 28/31]
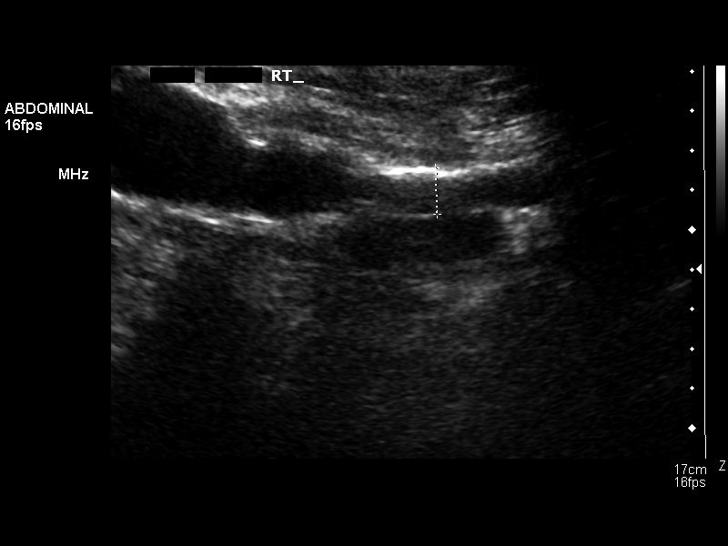
[im 31/31]
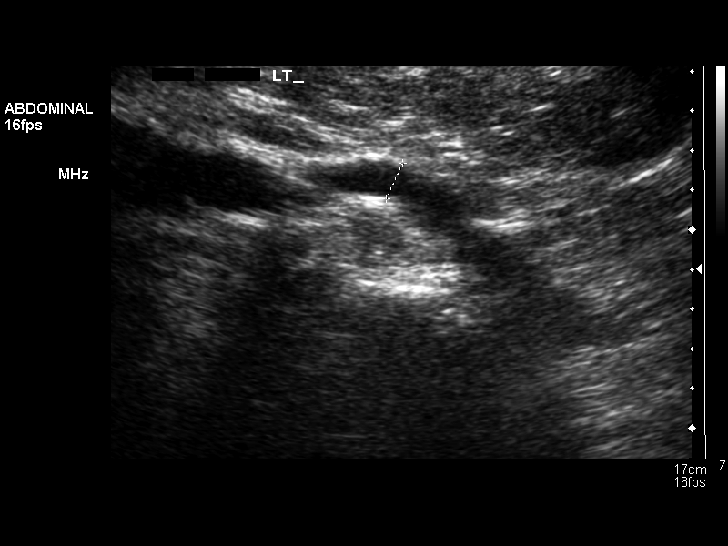

[14 of 25 positions shown; findings below may reference images not displayed]

FINDINGS: Abdominal Aorta

Fusiform distal aortic aneurysm, maximal dimensions described below.
There is ectasia of the aortic terminus at 2.2 cm maximal diameter.
No aneurysm continuation into the iliac vessels. There is mural
calcification consistent with atherosclerosis.

Maximum AP

Diameter:  3.4cm

Maximum TRV

Diameter: 4.2 cm
IMPRESSION: 4.2 cm infrarenal aortic aneurysm. No evidence of enlargement from
May 2012.

## 2014-12-17 ENCOUNTER — Other Ambulatory Visit: Payer: Self-pay | Admitting: Family Medicine

## 2014-12-20 NOTE — Telephone Encounter (Signed)
Last visit 11/25/14 Last refill 09/20/14 #180 2 refill

## 2014-12-20 NOTE — Telephone Encounter (Signed)
Refill for 3 months. 

## 2014-12-21 NOTE — Telephone Encounter (Signed)
Rx sent to pharmacy   

## 2015-01-10 ENCOUNTER — Telehealth: Payer: Self-pay | Admitting: Family Medicine

## 2015-01-10 NOTE — Telephone Encounter (Signed)
Pt wife call to say that Dr Elease Hashimoto told her that he was going to give her husband a test because of some issues with his bowel and stomach. Wife said they have nit heard anything and call to ask what has happen with the test that the doctor was going to give. Husband could not have a colonoscopy because he takes xarelto Would like a call back

## 2015-01-10 NOTE — Telephone Encounter (Signed)
What we discussed was Cologuard and THEY were supposed to check with their insurance to see if they have coverage.  If they have coverage and want to pursue they we can order.

## 2015-01-11 NOTE — Telephone Encounter (Signed)
Pt was given the number again to call Cologuard. Pt states that he will call the number.

## 2015-01-11 NOTE — Telephone Encounter (Signed)
Pt states they called [facility] and Colog[facility] needs a referral.  Can you order the referral?

## 2015-01-12 NOTE — Telephone Encounter (Signed)
Faxed form to [facility]olog[facility].

## 2015-01-16 ENCOUNTER — Other Ambulatory Visit: Payer: Self-pay | Admitting: Family Medicine

## 2015-02-11 ENCOUNTER — Telehealth: Payer: Self-pay | Admitting: Family Medicine

## 2015-02-11 NOTE — Telephone Encounter (Signed)
Pt request refill of the following: diazepam (VALIUM) 5 MG tablet  Pt also is asking about a stool sample that he sent off and is asking if you have received his results    Phamacy:  [facility] [place]

## 2015-02-11 NOTE — Telephone Encounter (Signed)
Pt last visit 11/25/14 and pt last refill 12/30/12 i called the pharmacy to make sure pt haven't receive Rx refill after that date but he hasn't. Please  advise

## 2015-02-13 NOTE — Telephone Encounter (Signed)
i know that he does not take this regularly and highly advise that he try to taper off as much as possible.  Refill #30 and use sparingly for muscle spasm

## 2015-02-15 NOTE — Telephone Encounter (Signed)
Rx was call in  

## 2015-03-11 ENCOUNTER — Other Ambulatory Visit: Payer: Self-pay | Admitting: Family Medicine

## 2015-03-13 NOTE — Telephone Encounter (Signed)
Should not be due for Tramadol yet- he got #180 with 2 refills 8-23. May refill #30 Diazepam but would avoid regular use- should be using only for severe muscle spasm.

## 2015-04-12 ENCOUNTER — Other Ambulatory Visit: Payer: Self-pay | Admitting: Family Medicine

## 2015-05-10 ENCOUNTER — Other Ambulatory Visit: Payer: Self-pay | Admitting: Family Medicine

## 2015-06-08 ENCOUNTER — Other Ambulatory Visit: Payer: Self-pay | Admitting: Family Medicine

## 2015-07-07 ENCOUNTER — Other Ambulatory Visit: Payer: Self-pay | Admitting: Family Medicine

## 2015-07-08 NOTE — Telephone Encounter (Signed)
Refill once.  I would like for him to try to reduce to no more than one po q 6 hours prn pain.

## 2015-07-08 NOTE — Telephone Encounter (Signed)
Last fill was #180 06/09/2015 Last CPE/appt 11/25/2014 No future pending appt

## 2015-08-03 ENCOUNTER — Other Ambulatory Visit: Payer: Self-pay | Admitting: Family Medicine

## 2015-08-03 ENCOUNTER — Encounter (INDEPENDENT_AMBULATORY_CARE_PROVIDER_SITE_OTHER): Payer: Self-pay

## 2015-08-04 ENCOUNTER — Other Ambulatory Visit: Payer: Self-pay | Admitting: Family Medicine

## 2015-08-04 NOTE — Telephone Encounter (Signed)
Pt is due 08/07/15 for refill.

## 2015-09-01 ENCOUNTER — Other Ambulatory Visit: Payer: Self-pay | Admitting: Family Medicine

## 2015-09-02 NOTE — Telephone Encounter (Signed)
Last OV 11-25-2014 Tramadol filled last on 3/10 #180 Diazepam filled last on 08/07/2015 #30

## 2015-09-04 NOTE — Telephone Encounter (Signed)
Refill Tramadol OK.  As we have discussed previously, should not be taking the Diazepam regularly- only for severe muscle spasm.  Refill once.

## 2015-09-24 ENCOUNTER — Other Ambulatory Visit: Payer: Self-pay | Admitting: Family Medicine

## 2015-09-27 NOTE — Telephone Encounter (Signed)
These were just filled on May 8th.  Should not be due yet.

## 2015-10-26 ENCOUNTER — Telehealth: Payer: Self-pay | Admitting: Family Medicine

## 2015-10-26 ENCOUNTER — Other Ambulatory Visit: Payer: Self-pay | Admitting: Family Medicine

## 2015-10-26 MED ORDER — DIAZEPAM 5 MG PO TABS: ORAL_TABLET | ORAL | Status: DC

## 2015-10-26 NOTE — Telephone Encounter (Signed)
Last script printed on 09/01/2015 but not filled until 09/24/2015 for #180 Pending appt on 11/22/2015 for CPE Please advise?

## 2015-10-26 NOTE — Telephone Encounter (Signed)
Appt are UTD and medication due for refill. I have called into the drug store.

## 2015-10-26 NOTE — Addendum Note (Signed)
Addended by: Elio Forget on: 10/26/2015 05:36 PM   Modules accepted: Orders

## 2015-10-26 NOTE — Telephone Encounter (Signed)
Refill request for Diazepam 5 mg and send to [facility].

## 2015-10-26 NOTE — Telephone Encounter (Signed)
Refill request for Ultram 50 mg and send to [facility].

## 2015-10-28 MED ORDER — TRAMADOL HCL 50 MG PO TABS: ORAL_TABLET | ORAL | Status: DC

## 2015-10-28 NOTE — Addendum Note (Signed)
Addended by: Elio Forget on: 10/28/2015 08:32 AM   Modules accepted: Orders

## 2015-10-28 NOTE — Telephone Encounter (Signed)
Refill once 

## 2015-10-28 NOTE — Telephone Encounter (Signed)
Verbally called in for patient.  

## 2015-11-02 ENCOUNTER — Encounter: Payer: Self-pay | Admitting: Family Medicine

## 2015-11-02 ENCOUNTER — Ambulatory Visit (INDEPENDENT_AMBULATORY_CARE_PROVIDER_SITE_OTHER)
Admission: RE | Admit: 2015-11-02 | Discharge: 2015-11-02 | Disposition: A | Discharge status: Home / Self Care | Payer: Medicare Other | Source: Ambulatory Visit | Attending: Family Medicine | Admitting: Family Medicine

## 2015-11-02 ENCOUNTER — Ambulatory Visit (INDEPENDENT_AMBULATORY_CARE_PROVIDER_SITE_OTHER): Payer: Medicare Other | Admitting: Family Medicine

## 2015-11-02 VITALS — BP 110/70 | HR 67 | Temp 97.4°F | Ht 68.5 in | Wt 211.0 lb

## 2015-11-02 DIAGNOSIS — M545 Low back pain, unspecified: Secondary | ICD-10-CM

## 2015-11-02 DIAGNOSIS — G8929 Other chronic pain: Secondary | ICD-10-CM

## 2015-11-02 NOTE — Progress Notes (Signed)
Pre visit review using our clinic review tool, if applicable. No additional management support is needed unless otherwise documented below in the visit note. 

## 2015-11-02 NOTE — Progress Notes (Signed)
   Subjective:    Patient ID: Jerry Fox, male    DOB: June 16, 1933, 82 y.o.   MRN: [id number]  HPI Patient has chronic low back pain with worsening over the past few weeks. Location is mid lumbar without radiation Pain is worse when he first gets up in the morning and with for starting to walk. Pain gradually improves as he walks. He has some pain with sitting or walking. Not exacerbated with back extension. Denies associated weakness or numbness No urine or stool incontinence. Occasional "stinging sensation "bilateral anterior thighs. He generally takes tramadol-usually to every 8 hours which usually controls his pain but recently has had some issues with waking up with my pain. Has not tried any Tylenol. History of atrial fibrillation and takes Xarelto  Past Medical History  Diagnosis Date  . Arthritis   . Murmur, cardiac   . Hypertension   . Colon polyps   . Hyperlipidemia   . Atrial fibrillation ([place]) 2/14    acute pulmonary embolus  . Saddle pulmonary embolus ([place]) 2/14    LLE DVT  . AAA (abdominal aortic aneurysm) ([place]) 2/14    3.8 cm 2/14   Past Surgical History  Procedure Laterality Date  . Tonsillectomy      reports that he quit smoking about 61 years ago. His smoking use included Cigarettes. He has a 10 pack-year smoking history. He quit smokeless tobacco use about 51 years ago. His smokeless tobacco use included Chew. He reports that he does not drink alcohol. His drug history is not on file. family history includes Arthritis in his father and mother; Heart disease in his father, mother, and paternal grandfather; Hypertension in his father. Allergies  Allergen Reactions  . Diltiazem Rash      Review of Systems  Constitutional: Negative for fever, chills, activity change, appetite change and unexpected weight change.  Respiratory: Negative for cough and shortness of breath.   Cardiovascular: Negative for chest pain and leg swelling.  Gastrointestinal:  Negative for vomiting and abdominal pain.  Genitourinary: Negative for dysuria, hematuria and flank pain.  Musculoskeletal: Positive for back pain. Negative for joint swelling.  Neurological: Negative for weakness and numbness.       Objective:   Physical Exam  Constitutional: He appears well-developed and well-nourished.  Cardiovascular: Normal rate and regular rhythm.   Pulmonary/Chest: Effort normal and breath sounds normal. No respiratory distress. He has no wheezes. He has no rales.  Musculoskeletal: He exhibits no edema.  Straight leg raises are negative bilaterally  Neurological:  Full-strength lower extremities. 2+ reflexes knee and ankle bilaterally. No sensory impairment. Good distal pulses          Assessment & Plan:  Chronic low back pain. Neuro exam is nonfocal. Doubt significant lumbar stenosis as his back pain actually tends to improve with walking and not exacerbated by back extension. Continue tramadol. Recommend try supplement with regular Tylenol as needed. He's had physical therapy in past which did not help. Has not had any recent imaging and will set up plain films to further assess. For any progressive symptoms may need MRI to further assess. Discussed importance of weight control and maintaining overall conditioning as much as possible  [name] MD [facility] at [facility]

## 2015-11-07 ENCOUNTER — Encounter: Payer: Self-pay | Admitting: Family Medicine

## 2015-11-10 ENCOUNTER — Telehealth: Payer: Self-pay | Admitting: Family Medicine

## 2015-11-10 NOTE — Telephone Encounter (Signed)
Pt was seen on 11/02/15. Pt would like to go back on hydrocodone for back pain. Pt is aware md out of office today

## 2015-11-10 NOTE — Telephone Encounter (Signed)
Pt has been sch

## 2015-11-10 NOTE — Telephone Encounter (Signed)
Pt will need an office visit to discuss this. If he wants to come in next week that is fine, or he can wait until his CPE on 11/28/2015.

## 2015-11-11 ENCOUNTER — Telehealth: Payer: Self-pay | Admitting: Family Medicine

## 2015-11-11 NOTE — Telephone Encounter (Signed)
Medication is not due for refill until 11/25/15.

## 2015-11-11 NOTE — Telephone Encounter (Signed)
Pending appt on Monday---11/14/2015.

## 2015-11-11 NOTE — Telephone Encounter (Signed)
Refill request for Diazepam 5 mg and send to [facility].

## 2015-11-13 NOTE — Telephone Encounter (Signed)
Will address at appt on 7-17

## 2015-11-14 ENCOUNTER — Ambulatory Visit (INDEPENDENT_AMBULATORY_CARE_PROVIDER_SITE_OTHER): Payer: Medicare Other | Admitting: Family Medicine

## 2015-11-14 VITALS — BP 100/80 | HR 75 | Temp 98.2°F | Ht 68.5 in | Wt 210.7 lb

## 2015-11-14 DIAGNOSIS — M545 Low back pain, unspecified: Secondary | ICD-10-CM

## 2015-11-14 DIAGNOSIS — G8929 Other chronic pain: Secondary | ICD-10-CM

## 2015-11-14 MED ORDER — HYDROCODONE-ACETAMINOPHEN 5-325 MG PO TABS: 1.0000 | ORAL_TABLET | Freq: Four times a day (QID) | ORAL | Status: DC | PRN

## 2015-11-14 NOTE — Patient Instructions (Signed)
Stop the Tramadol and Diazepam We will call you with the physical therapy.

## 2015-11-14 NOTE — Progress Notes (Signed)
Pre visit review using our clinic review tool, if applicable. No additional management support is needed unless otherwise documented below in the visit note. 

## 2015-11-14 NOTE — Progress Notes (Signed)
Subjective:     Patient ID: Jerry Fox, male   DOB: 02-26-1934, 82 y.o.   MRN: [id number]  HPI Patient is seen to discuss issues regarding chronic low back pain. He has previously seen orthopedic surgeon-Dr Nelva Bush (but not for a few years) Had prior PT which he felt made worse. He has mostly midline low back pain which radiates somewhat bilateral and is both at rest and with position change. He had recent plain x-rays that showed only mild degenerative changes. MRI scan 2010-mild degenerative changes. No recent radiculopathy symptoms. Patient states his pain is 9 out of 10 at its worst. He has previously taken tramadol and diazepam with diazepam only prn but recently has had difficulty sleeping and severe pain even with this combination.  Denies any recent fall or injury No fevers or chills. No appetite or weight changes. Denies lower extremity numbness or weakness. No urine or stool incontinence. Pain is starting to limit him more functionally. For example, he states that he struggled to walk around at church yesterday  Past Medical History  Diagnosis Date  . Arthritis   . Murmur, cardiac   . Hypertension   . Colon polyps   . Hyperlipidemia   . Atrial fibrillation ([place]) 2/14    acute pulmonary embolus  . Saddle pulmonary embolus ([place]) 2/14    LLE DVT  . AAA (abdominal aortic aneurysm) ([place]) 2/14    3.8 cm 2/14   Past Surgical History  Procedure Laterality Date  . Tonsillectomy      reports that he quit smoking about 61 years ago. His smoking use included Cigarettes. He has a 10 pack-year smoking history. He quit smokeless tobacco use about 51 years ago. His smokeless tobacco use included Chew. He reports that he does not drink alcohol. His drug history is not on file. family history includes Arthritis in his father and mother; Heart disease in his father, mother, and paternal grandfather; Hypertension in his father. Allergies  Allergen Reactions  . Diltiazem Rash      Review of Systems  Constitutional: Negative for fever, activity change and appetite change.  Respiratory: Negative for cough and shortness of breath.   Cardiovascular: Negative for chest pain and leg swelling.  Gastrointestinal: Negative for vomiting and abdominal pain.  Genitourinary: Negative for dysuria, hematuria and flank pain.  Musculoskeletal: Positive for back pain. Negative for joint swelling.  Neurological: Negative for weakness and numbness.       Objective:   Physical Exam  Constitutional: He is oriented to person, place, and time. He appears well-developed and well-nourished. No distress.  Neck: No thyromegaly present.  Cardiovascular: Normal rate, regular rhythm and normal heart sounds.   No murmur heard. Pulmonary/Chest: Effort normal and breath sounds normal. No respiratory distress. He has no wheezes. He has no rales.  Musculoskeletal: He exhibits no edema.  SLRs are negative Back flexion of 90 degrees and extension of 20 degrees.  Neurological: He is alert and oriented to person, place, and time. He has normal reflexes. No cranial nerve deficit.  Skin: No rash noted.       Assessment:     Chronic low back pain. Worsening over several months. Patient has mild degenerative changes by recent x-ray. He has nonfocal exam neurologically at this time.  Current symptoms do not likely indicate acute nerve impingement.    Plan:     -set up another trial of PT -discontinue both the Tramadol and Diazepam (they do not seem to be helping) -avoid non-steroidals  with chronic Xarelto use. -Vicodin 5/325 one every 6 hours prn severe pain #60 -discussed measures to reduce constipation. -he has follow up in one month to reassess. -make sure he has signed controlled medication contract. -We discussed at some length trying to avoid chronic pain medications as much as possible -discussed realistic goals of pain management including improved function and that "0" level of  pain is not realistic  Over 25 minutes spent of which > 50% in direct patient contact with history, exam, reviewing prior x-rays with patient and discussing medication and PT options and realistic goals of therapy.   [name] MD [facility] at [facility]

## 2015-11-18 ENCOUNTER — Telehealth: Payer: Self-pay | Admitting: Family Medicine

## 2015-11-18 MED ORDER — TRAZODONE HCL 50 MG PO TABS
50.0000 mg | ORAL_TABLET | Freq: Every day | ORAL | Status: DC
Start: 2015-11-18 — End: 2016-04-27

## 2015-11-18 NOTE — Telephone Encounter (Signed)
Pt  Is aware that medication has been called into the pharmacy.

## 2015-11-18 NOTE — Telephone Encounter (Signed)
Patient Name: Jerry Fox  DOB: 21-Aug-1933    Initial Comment Caller states he was seen a couple of days ago. He changed his medication. He's not sleeping well. He was on Norco for years. Dr. Elease Hashimoto put him on 500mg  -Hydrocodone. Not having pain, not sleeping or rest.   Nurse Assessment  Nurse: Mallie Mussel, RN, Alveta Heimlich Date/Time Eilene Ghazi Time): 11/18/2015 11:10:54 AM  Confirm and document reason for call. If symptomatic, describe symptoms. You must click the next button to save text entered. ---Caller states that he saw the doctor who changed his medications recently. He had been on Hydrocodone 10mg  for years. He changed it to a pain med that begins with the letter M. It helped for a while. He recently put him on Hydrocodone 5mg  which has helped his pain, but he isn't sleeping or resting. He is asking about a muscle relaxer called Baclofen to help. He is thinking it might relax him enough to go to sleep. Advised him that I will forward this to the office, and someone will be contacting him about this. He verbalized understanding.  Has the patient traveled out of the country within the last 30 days? ---Not Applicable  Does the patient have any new or worsening symptoms? ---No     Guidelines    Guideline Title Affirmed Question Affirmed Notes       Final Disposition User   Clinical Call Mallie Mussel, RN, Alveta Heimlich

## 2015-11-18 NOTE — Telephone Encounter (Signed)
Patient is wanting to know if a muscle relaxer, Baclofen, will be able to help him sleep better? If so, okay to send into pharmacy?

## 2015-11-18 NOTE — Telephone Encounter (Signed)
Pt following up on request. Pt states he has not slept in 4 nights.

## 2015-11-18 NOTE — Telephone Encounter (Signed)
Given his age, I would not recommend going back on regular muscle relaxer.  I'm sure some of his difficulties relate to coming off Diazepam.   Let's try Trazodone 50 mg po qhs #30 with no refill - take one po qhs.  This should be a safer option.

## 2015-11-21 ENCOUNTER — Ambulatory Visit: Payer: Medicare Other | Admitting: Physical Therapy

## 2015-11-22 ENCOUNTER — Other Ambulatory Visit (INDEPENDENT_AMBULATORY_CARE_PROVIDER_SITE_OTHER): Payer: Medicare Other

## 2015-11-22 ENCOUNTER — Telehealth: Payer: Self-pay | Admitting: Family Medicine

## 2015-11-22 DIAGNOSIS — Z0000 Encounter for general adult medical examination without abnormal findings: Secondary | ICD-10-CM | POA: Diagnosis not present

## 2015-11-22 LAB — CBC WITH DIFFERENTIAL/PLATELET
Basophils Absolute: 0 10*3/uL (ref 0.0–0.1)
Basophils Relative: 0.5 % (ref 0.0–3.0)
EOS ABS: 0.1 10*3/uL (ref 0.0–0.7)
EOS PCT: 2.3 % (ref 0.0–5.0)
HCT: 40.8 % (ref 39.0–52.0)
HEMOGLOBIN: 13.8 g/dL (ref 13.0–17.0)
LYMPHS ABS: 1.7 10*3/uL (ref 0.7–4.0)
Lymphocytes Relative: 27.3 % (ref 12.0–46.0)
MCHC: 33.7 g/dL (ref 30.0–36.0)
MCV: 92.8 fl (ref 78.0–100.0)
MONO ABS: 0.7 10*3/uL (ref 0.1–1.0)
Monocytes Relative: 10.8 % (ref 3.0–12.0)
NEUTROS PCT: 59.1 % (ref 43.0–77.0)
Neutro Abs: 3.7 10*3/uL (ref 1.4–7.7)
Platelets: 233 10*3/uL (ref 150.0–400.0)
RBC: 4.4 Mil/uL (ref 4.22–5.81)
RDW: 13.6 % (ref 11.5–15.5)
WBC: 6.3 10*3/uL (ref 4.0–10.5)

## 2015-11-22 LAB — TSH: TSH: 0.8 u[IU]/mL (ref 0.35–4.50)

## 2015-11-22 LAB — BASIC METABOLIC PANEL
BUN: 13 mg/dL (ref 6–23)
CO2: 29 mEq/L (ref 19–32)
CREATININE: 0.95 mg/dL (ref 0.40–1.50)
Calcium: 9.2 mg/dL (ref 8.4–10.5)
Chloride: 100 mEq/L (ref 96–112)
GFR: 81.09 mL/min (ref >60.00)
GLUCOSE: 101 mg/dL — AB (ref 70–99)
Potassium: 4 mEq/L (ref 3.5–5.1)
Sodium: 137 mEq/L (ref 135–145)

## 2015-11-22 LAB — LIPID PANEL
CHOL/HDL RATIO: 3
CHOLESTEROL: 129 mg/dL (ref 0–200)
HDL: 37.1 mg/dL — ABNORMAL LOW (ref >39.00)
LDL CALC: 62 mg/dL (ref 0–99)
NonHDL: 91.57
TRIGLYCERIDES: 147 mg/dL (ref 0.0–149.0)
VLDL: 29.4 mg/dL (ref 0.0–40.0)

## 2015-11-22 LAB — HEPATIC FUNCTION PANEL
ALT: 16 U/L (ref 0–53)
AST: 15 U/L (ref 0–37)
Albumin: 3.6 g/dL (ref 3.5–5.2)
Alkaline Phosphatase: 53 U/L (ref 39–117)
BILIRUBIN DIRECT: 0.1 mg/dL (ref 0.0–0.3)
BILIRUBIN TOTAL: 0.6 mg/dL (ref 0.2–1.2)
Total Protein: 6.3 g/dL (ref 6.0–8.3)

## 2015-11-22 LAB — PSA: PSA: 2.11 ng/mL (ref 0.10–4.00)

## 2015-11-22 NOTE — Telephone Encounter (Signed)
My recommendation if he is not getting relief with the Hydrocodone is to either see Dr Nelva Bush or Tonita Cong again or consider pain management referral. His pain has been very chronic and I think the latter recommendation would be appropriate.

## 2015-11-22 NOTE — Telephone Encounter (Signed)
Pt is requesting an increase of MG in his Hydrocodone. He has been using 5mg  BID with no relief, he states that it feels the same as if he was taking Tramadol. His back pain is severe and he would like a trial basis of 10mg  Hydrocodone. Please advise.

## 2015-11-22 NOTE — Telephone Encounter (Signed)
The patient was in the office this morning and was wanting to speak with Autumn he didn't give any specifics and would like for Autumn to call him.

## 2015-11-23 NOTE — Telephone Encounter (Signed)
FYI: Declines referral. He is going to go to a doctor of his choice. I did educate him about getting Narcotics from multiple providers. He is agreeable to let us know if they prescribe any medications.

## 2015-11-28 ENCOUNTER — Telehealth: Payer: Self-pay

## 2015-11-28 ENCOUNTER — Ambulatory Visit (INDEPENDENT_AMBULATORY_CARE_PROVIDER_SITE_OTHER): Payer: Medicare Other | Admitting: Family Medicine

## 2015-11-28 ENCOUNTER — Encounter: Payer: Self-pay | Admitting: Family Medicine

## 2015-11-28 VITALS — BP 100/70 | HR 62 | Ht 68.5 in | Wt 217.4 lb

## 2015-11-28 DIAGNOSIS — M545 Low back pain, unspecified: Secondary | ICD-10-CM

## 2015-11-28 DIAGNOSIS — G8929 Other chronic pain: Secondary | ICD-10-CM

## 2015-11-28 DIAGNOSIS — I714 Abdominal aortic aneurysm, without rupture, unspecified: Secondary | ICD-10-CM

## 2015-11-28 DIAGNOSIS — Z0000 Encounter for general adult medical examination without abnormal findings: Secondary | ICD-10-CM

## 2015-11-28 DIAGNOSIS — Z23 Encounter for immunization: Secondary | ICD-10-CM | POA: Diagnosis not present

## 2015-11-28 DIAGNOSIS — Z0001 Encounter for general adult medical examination with abnormal findings: Secondary | ICD-10-CM

## 2015-11-28 MED ORDER — DULOXETINE HCL 60 MG PO CPEP: 60.0000 mg | ORAL_CAPSULE | Freq: Every day | ORAL | 5 refills | Status: DC

## 2015-11-28 MED ORDER — DULOXETINE HCL 30 MG PO CPEP: 30.0000 mg | ORAL_CAPSULE | Freq: Every day | ORAL | 0 refills | Status: DC

## 2015-11-28 NOTE — Progress Notes (Signed)
Pre visit review using our clinic review tool, if applicable. No additional management support is needed unless otherwise documented below in the visit note. 

## 2015-11-28 NOTE — Progress Notes (Signed)
Subjective:     Patient ID: Jerry Fox, male   DOB: 04/19/1934, 82 y.o.   MRN: [id number]  HPI Patient is here for physical exam and also to discuss his chronic low back pain. His chronic problems include history of abdominal aortic aneurysm, dyslipidemia, hypertension, history of pulmonary embolus, abdominal aortic aneurysm, history of atrial fibrillation. On chronic anticoagulation. No recent bleeding issues. [facility] screening of abdominal aortic aneurysm 3.8 cm.  No recent abdominal pain.  He's had previous Prevnar 10 a year ago. He needs Pneumovax. He states that he completed Cologuard last year but we have no record.  Chronic back pain. For years he's had chronic low back pain. Recent lumbar spine films showed mild arthritis changes. Set previous MRI scans. Briefly saw back specialist. He states that for years he took hydrocodone 10 mg which controlled his pain fairly well. We recently started back on 5 mg which he taken twice a day but has severe pain breakthrough 9-10 out of 10. He has not tried Cymbalta previously.  Back pain is limiting his activities.  Pain is centered mid-low lumbar area. No radiculopathy symptoms. No lower extremity numbness or weakness. No loss of urine or stool control. He's had some weight gain of about 8 pounds started past year. Very sedentary  Review of Systems  Constitutional: Positive for fatigue and unexpected weight change (weight gain). Negative for activity change, appetite change and fever.  HENT: Negative for congestion, ear pain and trouble swallowing.   Eyes: Negative for pain and visual disturbance.  Respiratory: Negative for cough, shortness of breath and wheezing.   Cardiovascular: Negative for chest pain and palpitations.  Gastrointestinal: Negative for abdominal distention, abdominal pain, blood in stool, constipation, diarrhea, nausea, rectal pain and vomiting.  Genitourinary: Negative for dysuria, hematuria and testicular pain.   Musculoskeletal: Positive for back pain. Negative for arthralgias and joint swelling.  Skin: Negative for rash.  Neurological: Negative for dizziness, syncope and headaches.  Hematological: Negative for adenopathy.  Psychiatric/Behavioral: Negative for confusion and dysphoric mood.       Objective:   Physical Exam  Constitutional: He is oriented to person, place, and time. He appears well-developed and well-nourished.  HENT:  Right Ear: External ear normal.  Left Ear: External ear normal.  Mouth/Throat: Oropharynx is clear and moist.  Neck: Neck supple. No thyromegaly present.  Cardiovascular: Normal rate and regular rhythm.   Pulmonary/Chest: Effort normal and breath sounds normal. No respiratory distress. He has no wheezes. He has no rales.  Abdominal: Soft. He exhibits no distension and no mass. There is no tenderness. There is no rebound and no guarding.  Musculoskeletal:  Trace edema legs bilaterally  Lymphadenopathy:    He has no cervical adenopathy.  Neurological: He is alert and oriented to person, place, and time. No cranial nerve deficit.  Full-strength lower extremities.  Skin: No rash noted.  Psychiatric: He has a normal mood and affect. His behavior is normal.   .  Past Medical History:  Diagnosis Date  . AAA (abdominal aortic aneurysm) ([place]) 2/14   3.8 cm 2/14  . Arthritis   . Atrial fibrillation ([place]) 2/14   acute pulmonary embolus  . Colon polyps   . Hyperlipidemia   . Hypertension   . Murmur, cardiac   . Saddle pulmonary embolus ([place]) 2/14   LLE DVT   Past Surgical History:  Procedure Laterality Date  . TONSILLECTOMY      reports that he quit smoking about 61 years ago. His smoking  use included Cigarettes. He has a 10.00 pack-year smoking history. He quit smokeless tobacco use about 51 years ago. His smokeless tobacco use included Chew. He reports that he does not drink alcohol. His drug history is not on file. family history includes Arthritis in  his father and mother; Heart disease in his father, mother, and paternal grandfather; Hypertension in his father. Allergies  Allergen Reactions  . Diltiazem Rash       Assessment:     #1 physical exam. Patient needs Pneumovax. He states he had cologuard screening last year but we cannot find records  #2 chronic low back pain with history of known degenerative spondylosis-increasing severity recently.. Pain poorly controlled with low-dose hydrocodone  #3 Abdominal aortic aneurysm.    Plan:     -Continue with yearly flu vaccination - Pneumovax booster given -Start Cymbalta 30 mg once daily for 1 week then titrate to 60 mg daily for chronic back pain -Bring back to reassess in one month -Set up abdominal aortic aneurysm duplex screening to monitor his aneurysm -try to avoid escalation in opioid use - we discussed possible referral to pain management if pain not improving with the above.  [name] MD  Primary Care at [facility]

## 2015-11-28 NOTE — Telephone Encounter (Signed)
Pt is requesting a refill of Tramadol 50mg  tablet #180 from [facility]. He received Hydrocodone #60 on 11/14/2015. Please advise since Tramadol was not on his medication list.

## 2015-11-28 NOTE — Telephone Encounter (Signed)
We had a long discussion regarding his chronic back pain today. We explained that we absolutely cannot mix tramadol with hydrocodone. Our game plan was to try Cymbalta along with low-dose hydrocodone and I would suggest giving that a try first

## 2015-11-28 NOTE — Patient Instructions (Signed)
-  Start Cymbalta 30 mg once daily for 1 week then titrate to 60 mg tablet once daily -Set up follow-up appointment in one month to reassess -Continue yearly flu vaccination. -We will set up abdominal aortic aneurysm screen

## 2015-11-29 NOTE — Telephone Encounter (Signed)
Pt is aware that he is not going to get a RX for Tramadol and will proceed with the current plan addressed below.

## 2015-12-09 ENCOUNTER — Other Ambulatory Visit: Payer: Self-pay | Admitting: Family Medicine

## 2015-12-09 ENCOUNTER — Other Ambulatory Visit: Payer: Self-pay | Admitting: *Deleted

## 2015-12-09 NOTE — Telephone Encounter (Signed)
HYDROcodone-acetaminophen (NORCO/VICODIN) 5-325 MG      Pt req refill

## 2015-12-09 NOTE — Telephone Encounter (Signed)
Last rx given on 7/17-#60

## 2015-12-11 NOTE — Telephone Encounter (Signed)
Refill once.  Make sure pt has follow up.  We started Cymbalta and need to reassess how is doing after one month on that.

## 2015-12-12 MED ORDER — HYDROCODONE-ACETAMINOPHEN 5-325 MG PO TABS: 1.0000 | ORAL_TABLET | Freq: Four times a day (QID) | ORAL | 0 refills | Status: DC | PRN

## 2015-12-12 NOTE — Telephone Encounter (Signed)
I called the pt and informed him the Rx for Hydrocodone was left at the front desk for him to pick up.

## 2015-12-21 ENCOUNTER — Telehealth: Payer: Self-pay | Admitting: Family Medicine

## 2015-12-21 ENCOUNTER — Telehealth: Payer: Self-pay | Admitting: *Deleted

## 2015-12-21 NOTE — Telephone Encounter (Signed)
Rx request put in for Diazepam and routed to Dr. Elease Hashimoto.

## 2015-12-21 NOTE — Telephone Encounter (Signed)
yes

## 2015-12-21 NOTE — Telephone Encounter (Signed)
Pt request refill  diazepam (VALIUM) 5 MG tablet   [facility] [address] [place]

## 2015-12-21 NOTE — Telephone Encounter (Signed)
I had already mentioned to him that I do not feel comfortable having him on chronic diazepam in combination with hydrocodone (which he is taking for chronic low back pain)- especially at his age.  I would be happy to refer to pain management if he would like and we had discussed this previously.

## 2015-12-21 NOTE — Telephone Encounter (Signed)
Just want to confirm that you took pt off this medication?

## 2015-12-21 NOTE — Telephone Encounter (Signed)
Pt is aware he should no longer take medication.

## 2015-12-21 NOTE — Telephone Encounter (Signed)
Let patient know that Dr. Elease Hashimoto did not feel comfortable prescribing the diazepam any more since patient is also on hydrocodone. Patient seemed frustrated. Stated he is having muscle spasms in back which are worse with his coughing. I explained that as we get older we don't metabolize medications as well and it gets dangerous to rx too much. Offered to ask you if there was an alternative to the diazepam but patient declined and stated he goes to church with a doctor friend and would ask him.

## 2015-12-30 ENCOUNTER — Ambulatory Visit: Payer: Medicare Other | Admitting: Family Medicine

## 2015-12-30 ENCOUNTER — Encounter: Payer: Self-pay | Admitting: Family Medicine

## 2015-12-30 ENCOUNTER — Ambulatory Visit (INDEPENDENT_AMBULATORY_CARE_PROVIDER_SITE_OTHER): Payer: Medicare Other | Admitting: Family Medicine

## 2015-12-30 VITALS — BP 140/70 | HR 60 | Temp 97.5°F | Ht 68.5 in | Wt 207.0 lb

## 2015-12-30 DIAGNOSIS — G8929 Other chronic pain: Secondary | ICD-10-CM | POA: Diagnosis not present

## 2015-12-30 DIAGNOSIS — I714 Abdominal aortic aneurysm, without rupture, unspecified: Secondary | ICD-10-CM

## 2015-12-30 DIAGNOSIS — J019 Acute sinusitis, unspecified: Secondary | ICD-10-CM

## 2015-12-30 DIAGNOSIS — M545 Low back pain: Secondary | ICD-10-CM | POA: Diagnosis not present

## 2015-12-30 MED ORDER — AMOXICILLIN-POT CLAVULANATE 875-125 MG PO TABS
1.0000 | ORAL_TABLET | Freq: Two times a day (BID) | ORAL | 0 refills | Status: DC
Start: 2015-12-30 — End: 2016-01-09

## 2015-12-30 NOTE — Patient Instructions (Signed)
Get back on Cymbalta daily Continue with Tylenol as needed.

## 2015-12-30 NOTE — Progress Notes (Signed)
Subjective:     Patient ID: Jerry Fox, male   DOB: [birth year], 82 y.o.   MRN: [id number]  HPI Patient seen for several issues today as follows  Acute new problem of three-week history of cough. Excessive sinus congestion. She's had some thick yellow mucus for the past week. No headache. No fevers or chills. Cough is also productive. No wheezing. No dyspnea. No hemoptysis. Nonsmoker.  Abdominal aortic aneurysm. Last screening was 3.4 cm and this was over 2 years ago. No abdominal pain.  Chronic low back pain. We started Cymbalta. He developed nasal congestion and was concerned this may be secondary to Cymbalta and stopped this several days ago. He does think this might have been helping somewhat. He denies any other side effects. He has been controlling his back pain for the past few days with Tylenol alone. Had been taking hydrocodone. We did express her concerns about him taking hydrocodone and Valium especially at his age  Past Medical History:  Diagnosis Date  . AAA (abdominal aortic aneurysm) ([place]) 2/14   3.8 cm 2/14  . Arthritis   . Atrial fibrillation ([place]) 2/14   acute pulmonary embolus  . Colon polyps   . Hyperlipidemia   . Hypertension   . Murmur, cardiac   . Saddle pulmonary embolus ([place]) 2/14   LLE DVT   Past Surgical History:  Procedure Laterality Date  . TONSILLECTOMY      reports that he quit smoking about 61 years ago. His smoking use included Cigarettes. He has a 10.00 pack-year smoking history. He quit smokeless tobacco use about 51 years ago. His smokeless tobacco use included Chew. He reports that he does not drink alcohol. His drug history is not on file. family history includes Arthritis in his father and mother; Heart disease in his father, mother, and paternal grandfather; Hypertension in his father. Allergies  Allergen Reactions  . Diltiazem Rash     Review of Systems  Constitutional: Negative for appetite change, chills, fatigue, fever and  unexpected weight change.  HENT: Positive for congestion. Negative for sore throat.   Eyes: Negative for visual disturbance.  Respiratory: Positive for cough. Negative for chest tightness and shortness of breath.   Cardiovascular: Negative for chest pain, palpitations and leg swelling.  Gastrointestinal: Negative for abdominal pain.  Neurological: Negative for dizziness, syncope, weakness, light-headedness and headaches.       Objective:   Physical Exam  Constitutional: He appears well-developed and well-nourished.  HENT:  Right Ear: External ear normal.  Left Ear: External ear normal.  Mouth/Throat: Oropharynx is clear and moist.  Neck: Neck supple.  Cardiovascular: Normal rate and regular rhythm.   Pulmonary/Chest: Effort normal and breath sounds normal. No respiratory distress. He has no wheezes. He has no rales.  Musculoskeletal:  Trace edema ankles and legs bilaterally  Lymphadenopathy:    He has no cervical adenopathy.       Assessment:     #1 probable acute pansinusitis  #2 abdominal aortic aneurysm not screened in over 2 years  #3 chronic low back pain currently stable off opioid    Plan:     -Augmentin 875 mg twice daily for 10 days -Get back on Cymbalta 60 mg daily -Schedule abdominal aortic aneurysm screen -Continue Tylenol as needed for back pain  [name] MD [place] [facility] at [facility]

## 2016-01-09 ENCOUNTER — Telehealth: Payer: Self-pay | Admitting: Family Medicine

## 2016-01-09 ENCOUNTER — Ambulatory Visit (INDEPENDENT_AMBULATORY_CARE_PROVIDER_SITE_OTHER): Payer: Medicare Other | Admitting: Family Medicine

## 2016-01-09 ENCOUNTER — Encounter: Payer: Self-pay | Admitting: Family Medicine

## 2016-01-09 VITALS — BP 162/90 | HR 69 | Temp 97.8°F | Ht 68.5 in | Wt 209.0 lb

## 2016-01-09 DIAGNOSIS — I10 Essential (primary) hypertension: Secondary | ICD-10-CM | POA: Diagnosis not present

## 2016-01-09 NOTE — Patient Instructions (Signed)
Increase the Lisinopril 10 mg to two tablets once daily Continue with same dose of HCTZ and metoprolol.

## 2016-01-09 NOTE — Telephone Encounter (Signed)
[facility] [place] Day - Client [name] DOB: 12-09-1933 Initial Comment Caller states her husband has high BP. 177/87. 172/89. Nurse Assessment Nurse: Markus Daft, RN, Sherre Poot Date/Time (Eastern Time): 01/09/2016 9:11:58 AM Confirm and document reason for call. If symptomatic, describe symptoms. You must click the next button to save text entered. ---Caller states her husband has high BP at around 8:30 am 177/87, HR 55. 172/89 and HR 54. 166/80 and HR 54. About 20 min. ago, 161/71, 54. No HA, CP, SOB. - Seen about 2 wks ago for sinus congestion with post nasal drip causing coughing. Cough for about 4 wks. now. He was given Amoxicillin 125 mg BID #20. Finished yesterday. Coughing is improved, much less now. Very little nasal discharge now. No fever. Has the patient traveled out of the country within the last 30 days? ---No Does the patient have any new or worsening symptoms? ---Yes Will a triage be completed? ---Yes Related visit to physician within the last 2 weeks? ---Yes Does the PT have any chronic conditions? (i.e. diabetes, asthma, etc.) ---Yes List chronic conditions. ---HTN Is this a behavioral health or substance abuse call? ---No Guidelines Guideline Title Affirmed Question Affirmed Notes High Blood Pressure BP # 160/100 Final Disposition User See PCP When Office is Open (within 3 days) Markus Daft, Therapist, sports, [place] Comments Appt set for today at 3:45 pm with Dr. Elease Hashimoto. Referrals REFERRED TO PCP OFFICE Disagree/Comply: Comply

## 2016-01-09 NOTE — Progress Notes (Signed)
Subjective:     Patient ID: Jerry Fox, male   DOB: [birth year], 82 y.o.   MRN: [id number]  HPI Patient seen for elevated blood pressure. He has long-standing history of hypertension and currently takes lisinopril 10 mg daily, HCTZ, and Lopressor 50 mg twice daily. He states he is compliant with all medications. He had recent respiratory illness with significant cough. He just finished course of Augmentin. No recent fever. He had recent blood pressure reading of 180/100 at home and several readings around 123456 systolic over 0000000 diastolic. No headaches. No chest pains. Has had some recent low back pain which is a chronic issue for him. Denies any recent dietary changes.  Past Medical History:  Diagnosis Date  . AAA (abdominal aortic aneurysm) ([place]) 2/14   3.8 cm 2/14  . Arthritis   . Atrial fibrillation ([place]) 2/14   acute pulmonary embolus  . Colon polyps   . Hyperlipidemia   . Hypertension   . Murmur, cardiac   . Saddle pulmonary embolus ([place]) 2/14   LLE DVT   Past Surgical History:  Procedure Laterality Date  . TONSILLECTOMY      reports that he quit smoking about 61 years ago. His smoking use included Cigarettes. He has a 10.00 pack-year smoking history. He quit smokeless tobacco use about 51 years ago. His smokeless tobacco use included Chew. He reports that he does not drink alcohol. His drug history is not on file. family history includes Arthritis in his father and mother; Heart disease in his father, mother, and paternal grandfather; Hypertension in his father. Allergies  Allergen Reactions  . Diltiazem Rash     Review of Systems  Constitutional: Negative for fatigue.  Eyes: Negative for visual disturbance.  Respiratory: Negative for cough, chest tightness and shortness of breath.   Cardiovascular: Negative for chest pain and palpitations. Leg swelling: Occasional bilateral ankle edema.  Endocrine: Negative for polydipsia and polyuria.  Neurological: Negative for  dizziness, syncope, weakness, light-headedness and headaches.       Objective:   Physical Exam  Constitutional: He is oriented to person, place, and time. He appears well-developed and well-nourished.  HENT:  Right Ear: External ear normal.  Left Ear: External ear normal.  Mouth/Throat: Oropharynx is clear and moist.  Eyes: Pupils are equal, round, and reactive to light.  Neck: Neck supple. No thyromegaly present.  Cardiovascular: Normal rate and regular rhythm.   Pulmonary/Chest: Effort normal and breath sounds normal. No respiratory distress. He has no wheezes. He has no rales.  Musculoskeletal: He exhibits no edema.  Neurological: He is alert and oriented to person, place, and time.       Assessment:     Hypertension. Recent poor control by readings today here as well as several home readings    Plan:     -Increase lisinopril to 10 mg 2 tablets daily. Continue HCTZ and metoprolol. -Continue close monitoring of blood pressure at home -Office follow-up in 2 weeks to reassess  [name] MD [facility] [facility] at [facility]

## 2016-01-09 NOTE — Progress Notes (Signed)
Pre visit review using our clinic review tool, if applicable. No additional management support is needed unless otherwise documented below in the visit note. 

## 2016-01-09 NOTE — Telephone Encounter (Signed)
FYI

## 2016-01-09 NOTE — Telephone Encounter (Signed)
Patient has appt today with Dr. Elease Hashimoto.

## 2016-01-16 ENCOUNTER — Ambulatory Visit (HOSPITAL_COMMUNITY)
Admission: RE | Admit: 2016-01-16 | Discharge: 2016-01-16 | Disposition: A | Discharge status: Home / Self Care | Payer: Medicare Other | Source: Ambulatory Visit | Attending: Family Medicine | Admitting: Family Medicine

## 2016-01-16 DIAGNOSIS — Z8249 Family history of ischemic heart disease and other diseases of the circulatory system: Secondary | ICD-10-CM | POA: Diagnosis not present

## 2016-01-16 DIAGNOSIS — Z87891 Personal history of nicotine dependence: Secondary | ICD-10-CM | POA: Diagnosis not present

## 2016-01-16 DIAGNOSIS — I723 Aneurysm of iliac artery: Secondary | ICD-10-CM | POA: Insufficient documentation

## 2016-01-16 DIAGNOSIS — E785 Hyperlipidemia, unspecified: Secondary | ICD-10-CM | POA: Insufficient documentation

## 2016-01-16 DIAGNOSIS — I10 Essential (primary) hypertension: Secondary | ICD-10-CM | POA: Insufficient documentation

## 2016-01-16 DIAGNOSIS — I714 Abdominal aortic aneurysm, without rupture, unspecified: Secondary | ICD-10-CM

## 2016-01-23 ENCOUNTER — Encounter: Payer: Self-pay | Admitting: Family Medicine

## 2016-01-23 ENCOUNTER — Ambulatory Visit (INDEPENDENT_AMBULATORY_CARE_PROVIDER_SITE_OTHER): Payer: Medicare Other | Admitting: Family Medicine

## 2016-01-23 VITALS — BP 168/80 | HR 68 | Temp 97.6°F | Ht 68.5 in | Wt 202.0 lb

## 2016-01-23 DIAGNOSIS — I10 Essential (primary) hypertension: Secondary | ICD-10-CM | POA: Diagnosis not present

## 2016-01-23 MED ORDER — AMLODIPINE BESYLATE 5 MG PO TABS: 5.0000 mg | ORAL_TABLET | Freq: Every day | ORAL | 3 refills | Status: DC

## 2016-01-23 NOTE — Progress Notes (Signed)
Subjective:     Patient ID: Jerry Fox, male   DOB: [birth year], 82 y.o.   MRN: [id number]  HPI Patient seen for follow-up hypertension. He's had multiple elevated readings at home and recently increase lisinopril 20 mg daily. Over the past few days even with 30 mg has not seen much change at all with home blood pressure readings. He had consistent systolic readings between 160s and 170s. He also remains on HCTZ and metoprolol. He denies any headaches or other symptoms  Past Medical History:  Diagnosis Date  . AAA (abdominal aortic aneurysm) ([place]) 2/14   3.8 cm 2/14  . Arthritis   . Atrial fibrillation (Jerry Fox) 2/14   acute pulmonary embolus  . Colon polyps   . Hyperlipidemia   . Hypertension   . Murmur, cardiac   . Saddle pulmonary embolus (Jerry Fox) 2/14   LLE DVT   Past Surgical History:  Procedure Laterality Date  . TONSILLECTOMY      reports that he quit smoking about 62 years ago. His smoking use included Cigarettes. He has a 10.00 pack-year smoking history. He quit smokeless tobacco use about 52 years ago. His smokeless tobacco use included Chew. He reports that he does not drink alcohol. His drug history is not on file. family history includes Arthritis in his father and mother; Heart disease in his father, mother, and paternal grandfather; Hypertension in his father. Allergies  Allergen Reactions  . Diltiazem Rash     Review of Systems  Constitutional: Negative for fatigue.  Eyes: Negative for visual disturbance.  Respiratory: Negative for cough, chest tightness and shortness of breath.   Cardiovascular: Negative for chest pain, palpitations and leg swelling.  Neurological: Negative for dizziness, syncope, weakness, light-headedness and headaches.       Objective:   Physical Exam  Constitutional: He is oriented to person, place, and time. He appears well-developed and well-nourished.  HENT:  Right Ear: External ear normal.  Left Ear: External ear normal.   Mouth/Throat: Oropharynx is clear and moist.  Eyes: Pupils are equal, round, and reactive to light.  Neck: Neck supple. No thyromegaly present.  Cardiovascular: Normal rate and regular rhythm.   Pulmonary/Chest: Effort normal and breath sounds normal. No respiratory distress. He has no wheezes. He has no rales.  Musculoskeletal: He exhibits no edema.  Neurological: He is alert and oriented to person, place, and time.       Assessment:     Hypertension. Not improved with recent increase in lisinopril to 20 mg daily    Plan:     -Add Amlodipine 5 mg once daily -Continue to monitor blood pressure closely at home and office follow-up in 3 weeks to reassess  [name] MD [place] [facility] at [facility]

## 2016-02-13 ENCOUNTER — Ambulatory Visit: Payer: Medicare Other | Admitting: Family Medicine

## 2016-02-23 ENCOUNTER — Telehealth: Payer: Self-pay | Admitting: Family Medicine

## 2016-02-23 MED ORDER — LISINOPRIL 10 MG PO TABS: ORAL_TABLET | ORAL | 1 refills | Status: DC

## 2016-02-23 NOTE — Telephone Encounter (Signed)
Medication sent into the pharmacy. 

## 2016-02-23 NOTE — Telephone Encounter (Signed)
Pt request refill  lisinopril (PRINIVIL,ZESTRIL) 10 MG tablet 30 day only   pt states he cannot take the amLODipine because his bp drops too low.  Pt did not know he had the 10/16 appt with Dr Elease Hashimoto. Pt has made appt for 10/31  But is out of any bp meds. Needs today if possible.   [facility] , [place], [place]

## 2016-02-28 ENCOUNTER — Ambulatory Visit (INDEPENDENT_AMBULATORY_CARE_PROVIDER_SITE_OTHER): Payer: Medicare Other | Admitting: Family Medicine

## 2016-02-28 ENCOUNTER — Encounter: Payer: Self-pay | Admitting: Family Medicine

## 2016-02-28 VITALS — BP 140/82 | HR 66 | Temp 97.5°F | Ht 68.5 in | Wt 204.7 lb

## 2016-02-28 DIAGNOSIS — Z23 Encounter for immunization: Secondary | ICD-10-CM | POA: Diagnosis not present

## 2016-02-28 DIAGNOSIS — I10 Essential (primary) hypertension: Secondary | ICD-10-CM | POA: Diagnosis not present

## 2016-02-28 NOTE — Progress Notes (Signed)
Pre visit review using our clinic review tool, if applicable. No additional management support is needed unless otherwise documented below in the visit note. 

## 2016-02-28 NOTE — Progress Notes (Signed)
Subjective:     Patient ID: Jerry Fox, male   DOB: Jan 31, 1934, 82 y.o.   MRN: [id number]  HPI Follow-up hypertension. Recent elevated reading and we added amlodipine 5 mg daily. Already takes lisinopril and HCTZ. After several days his blood pressure reduced substantially and lowest reading was 123456 systolic but he had some lightheadedness and they discontinued the amlodipine. His blood pressures then started increased again up to 123XX123 range systolic and 2 days ago they started back amlodipine. He does have occasional lightheadedness with standing. No syncope. No chest pains. No dyspnea. No peripheral edema issues.  Past Medical History:  Diagnosis Date  . AAA (abdominal aortic aneurysm) ([place]) 2/14   3.8 cm 2/14  . Arthritis   . Atrial fibrillation ([place]) 2/14   acute pulmonary embolus  . Colon polyps   . Hyperlipidemia   . Hypertension   . Murmur, cardiac   . Saddle pulmonary embolus ([place]) 2/14   LLE DVT   Past Surgical History:  Procedure Laterality Date  . TONSILLECTOMY      reports that he quit smoking about 62 years ago. His smoking use included Cigarettes. He has a 10.00 pack-year smoking history. He quit smokeless tobacco use about 52 years ago. His smokeless tobacco use included Chew. He reports that he does not drink alcohol. His drug history is not on file. family history includes Arthritis in his father and mother; Heart disease in his father, mother, and paternal grandfather; Hypertension in his father. Allergies  Allergen Reactions  . Diltiazem Rash     Review of Systems  Constitutional: Negative for fatigue.  Eyes: Negative for visual disturbance.  Respiratory: Negative for cough, chest tightness and shortness of breath.   Cardiovascular: Negative for chest pain, palpitations and leg swelling.  Neurological: Positive for dizziness and light-headedness. Negative for syncope, weakness and headaches.       Objective:   Physical Exam  Constitutional: He is  oriented to person, place, and time. He appears well-developed and well-nourished.  Cardiovascular: Normal rate and regular rhythm.   Pulmonary/Chest: Effort normal and breath sounds normal. No respiratory distress. He has no wheezes. He has no rales.  Musculoskeletal: He exhibits no edema.  Neurological: He is alert and oriented to person, place, and time.       Assessment:     Hypertension improved. His blood pressure today sitting was 140/82 and standing 122/70    Plan:     -Start back daily amlodipine but at lower dose of 2.5 mg daily. Continue other blood pressure medications including metoprolol, HCTZ, and lisinopril. -If blood pressure continues to drop too severely would consider reducing or discontinuing HCTZ -Routine follow-up 3 months and sooner as needed  [name] MD [place] [facility] at [facility]

## 2016-02-28 NOTE — Patient Instructions (Signed)
MONITOR BLOOD PRESSURE AND IF BP DROPPING BELOW 123XX123 SYSTOLIC (TOP NUMBER) HOLD THE HCTZ.

## 2016-04-27 ENCOUNTER — Other Ambulatory Visit: Payer: Self-pay | Admitting: Family Medicine

## 2016-04-27 NOTE — Telephone Encounter (Signed)
Okay to refill? 

## 2016-05-01 NOTE — Telephone Encounter (Signed)
Refill for 6 months. 

## 2016-05-29 ENCOUNTER — Encounter: Payer: Self-pay | Admitting: Family Medicine

## 2016-05-29 ENCOUNTER — Ambulatory Visit (INDEPENDENT_AMBULATORY_CARE_PROVIDER_SITE_OTHER): Payer: Medicare Other | Admitting: Family Medicine

## 2016-05-29 VITALS — BP 130/70 | HR 66 | Temp 97.9°F | Ht 68.5 in | Wt 215.0 lb

## 2016-05-29 DIAGNOSIS — E785 Hyperlipidemia, unspecified: Secondary | ICD-10-CM

## 2016-05-29 DIAGNOSIS — M545 Low back pain, unspecified: Secondary | ICD-10-CM

## 2016-05-29 DIAGNOSIS — I10 Essential (primary) hypertension: Secondary | ICD-10-CM | POA: Diagnosis not present

## 2016-05-29 DIAGNOSIS — G8929 Other chronic pain: Secondary | ICD-10-CM

## 2016-05-29 DIAGNOSIS — I714 Abdominal aortic aneurysm, without rupture, unspecified: Secondary | ICD-10-CM

## 2016-05-29 MED ORDER — VALSARTAN 80 MG PO TABS: 80.0000 mg | ORAL_TABLET | Freq: Every day | ORAL | 3 refills | Status: DC

## 2016-05-29 MED ORDER — HYDROCODONE-ACETAMINOPHEN 5-325 MG PO TABS: 1.0000 | ORAL_TABLET | Freq: Four times a day (QID) | ORAL | 0 refills | Status: DC | PRN

## 2016-05-29 NOTE — Progress Notes (Signed)
Pre visit review using our clinic review tool, if applicable. No additional management support is needed unless otherwise documented below in the visit note. 

## 2016-05-29 NOTE — Progress Notes (Signed)
Subjective:     Patient ID: Jerry Fox, male   DOB: 05/18/1933, 83 y.o.   MRN: [id number]  HPI Patient seen for multiple issues as follows  Hypertension which has been controlled on regimen of lisinopril, amlodipine, HCTZ, and metoprolol. He has had a cough though for several months which is relatively mild and dry. No hemoptysis. No dyspnea. No appetite or weight change.  No active GERD symptoms.  Abdominal aortic aneurysm. He is overdue for repeat follow-up as this has been well over a year. He does have some chronic back pain. He's used hydrocodone infrequently for that. Has seen back specialist in the past. No radiculopathy symptoms.  Back pain is exacerbating by movement such as change of position.  History of pulmonary embolus and atrial fibrillation. He is maintained on chronic anticoagulation. No recent bleeding concerns.  Past Medical History:  Diagnosis Date  . AAA (abdominal aortic aneurysm) ([place]) 2/14   3.8 cm 2/14  . Arthritis   . Atrial fibrillation ([place]) 2/14   acute pulmonary embolus  . Colon polyps   . Hyperlipidemia   . Hypertension   . Murmur, cardiac   . Saddle pulmonary embolus ([place]) 2/14   LLE DVT   Past Surgical History:  Procedure Laterality Date  . TONSILLECTOMY      reports that he quit smoking about 62 years ago. His smoking use included Cigarettes. He has a 10.00 pack-year smoking history. He quit smokeless tobacco use about 52 years ago. His smokeless tobacco use included Chew. He reports that he does not drink alcohol. His drug history is not on file. family history includes Arthritis in his father and mother; Heart disease in his father, mother, and paternal grandfather; Hypertension in his father. Allergies  Allergen Reactions  . Diltiazem Rash     Review of Systems  Constitutional: Negative for fatigue.  Eyes: Negative for visual disturbance.  Respiratory: Positive for cough. Negative for chest tightness, shortness of breath and  wheezing.   Cardiovascular: Negative for chest pain, palpitations and leg swelling.  Musculoskeletal: Positive for back pain.  Neurological: Negative for dizziness, syncope, weakness, light-headedness and headaches.       Objective:   Physical Exam  Constitutional: He is oriented to person, place, and time. He appears well-developed and well-nourished.  HENT:  Right Ear: External ear normal.  Left Ear: External ear normal.  Mouth/Throat: Oropharynx is clear and moist.  Eyes: Pupils are equal, round, and reactive to light.  Neck: Neck supple. No thyromegaly present.  Cardiovascular: Normal rate and regular rhythm.   Pulmonary/Chest: Effort normal and breath sounds normal. No respiratory distress. He has no wheezes. He has no rales.  Musculoskeletal: He exhibits no edema.  Neurological: He is alert and oriented to person, place, and time.       Assessment:     #1 hypertension stable and at goal  #2 chronic cough possibly ACE inhibitor related.  #3 abdominal aortic aneurysm-overdue for follow up.  #4 history of atrial fibrillation on chronic anticoagulation    Plan:     -Discontinue lisinopril and start valsartan 80 mg once daily -Obtain duplex ultrasound abdomen to follow-up aneurysm -Refill hydrocodone for infrequent use for his chronic back pain -Routine follow-up in 6 months and sooner as needed  [name] MD [facility]

## 2016-05-29 NOTE — Patient Instructions (Addendum)
Stop the Lisinopril and start the Valsartan We are setting up repeat ultrasound of abdomen. Let me know if you have not heard about ultrasound in one week.

## 2016-06-05 ENCOUNTER — Telehealth: Payer: Self-pay | Admitting: Family Medicine

## 2016-06-05 NOTE — Telephone Encounter (Signed)
° ° °  Pt call to say he was told that he would be sent to have his aneurysm check. They call to say they have not heard from anyone and would like a call back

## 2016-06-05 NOTE — Telephone Encounter (Signed)
Pt is aware that order is entered and he will be contacted to have appt scheduled.

## 2016-06-21 ENCOUNTER — Other Ambulatory Visit: Payer: Self-pay | Admitting: Family Medicine

## 2016-08-16 ENCOUNTER — Telehealth: Payer: Self-pay | Admitting: Family Medicine

## 2016-08-16 NOTE — Telephone Encounter (Signed)
May refill #60.  He should only be taking this infrequently for severe pain.

## 2016-08-16 NOTE — Telephone Encounter (Signed)
Pt needs new rx hydrocodone °

## 2016-08-16 NOTE — Telephone Encounter (Signed)
Last Rx given on 1/30 for #90 with no refills

## 2016-08-17 NOTE — Telephone Encounter (Signed)
Answered yesterday.  Refill #60 but should be taking only infrequently for prn use.

## 2016-08-17 NOTE — Telephone Encounter (Signed)
Hydrocodone/APAP 5-325 mg tab Last office visit and refill 05/29/16.  -Refill hydrocodone for infrequent use for his chronic back pain -Routine follow-up in 6 months and sooner as needed  Okay to refill?

## 2016-08-17 NOTE — Telephone Encounter (Signed)
Attempted to call patient but no voicemail at home number and cell number has been changed.  Will call again at another time.

## 2016-08-18 ENCOUNTER — Other Ambulatory Visit: Payer: Self-pay | Admitting: Family Medicine

## 2016-08-20 ENCOUNTER — Other Ambulatory Visit: Payer: Self-pay | Admitting: Family Medicine

## 2016-08-20 ENCOUNTER — Telehealth: Payer: Self-pay | Admitting: Family Medicine

## 2016-08-20 MED ORDER — HYDROCODONE-ACETAMINOPHEN 5-325 MG PO TABS: 1.0000 | ORAL_TABLET | Freq: Four times a day (QID) | ORAL | 0 refills | Status: DC | PRN

## 2016-08-20 NOTE — Telephone Encounter (Signed)
Pt calling to check the status of the Rx Hydrocodone and pts #'s verified and updated.  Pt called the pharmacy and they stated that he must come by the office to pick up Rx and bring it in.  Pt is aware that it is not up front.

## 2016-08-20 NOTE — Telephone Encounter (Signed)
Rx ready for pick up. 

## 2016-08-20 NOTE — Telephone Encounter (Signed)
Pt is aware Rx is ready for pick-up

## 2016-08-20 NOTE — Addendum Note (Signed)
Addended by: Westley Hummer B on: 08/20/2016 02:28 PM   Modules accepted: Orders

## 2016-08-20 NOTE — Telephone Encounter (Signed)
Spoke with patient and Rx called in. 

## 2016-08-22 NOTE — Telephone Encounter (Signed)
error 

## 2016-09-17 ENCOUNTER — Other Ambulatory Visit: Payer: Self-pay | Admitting: Family Medicine

## 2016-10-02 ENCOUNTER — Telehealth: Payer: Self-pay | Admitting: Family Medicine

## 2016-10-02 ENCOUNTER — Other Ambulatory Visit: Payer: Self-pay | Admitting: Family Medicine

## 2016-10-02 NOTE — Telephone Encounter (Signed)
Last refill 08/20/16. Last office visit 05/29/16  -Refill hydrocodone for infrequent use for his chronic back pain  Okay to fill?

## 2016-10-02 NOTE — Telephone Encounter (Signed)
Pt need new Rx for Hydrocodone   Pt is aware of 3 business days for refills and someone will call when ready for pick up.  Pt is aware that Dr. Elease Hashimoto is out of the office and will be returning on 10/08/16

## 2016-10-03 ENCOUNTER — Telehealth: Payer: Self-pay

## 2016-10-03 MED ORDER — HYDROCODONE-ACETAMINOPHEN 5-325 MG PO TABS: 1.0000 | ORAL_TABLET | Freq: Four times a day (QID) | ORAL | 0 refills | Status: DC | PRN

## 2016-10-03 NOTE — Telephone Encounter (Signed)
Pt walked in to office wanting to be seen for LLE edema that has been going on for >78month. He is going out of town in the the morning and was wanting to be seen this afternoon. Pt came in to office at 4:15pm and there are no appts left for today. Pt was triaged to assess status.   Pt has edema to LLE with 1-2+pitting. He reports this has been present off and on for several weeks, maybe more than a month. He does report pain/tight sensation with movement. Difficulty wearing certain shoes/socks. Sometimes a discoloration of red/purple to foot and toes. Pt does have pos hx of DVT's to LE but has no s/s consistent with that today. Pt was recommended to see UC this evening. He refuses, stating that he is going out of town in the morning and doesn't have time. Pt is going on a 3.5hr car ride to [facility]. Advised pt he needs to wear compression socks and stop every hour for 10-56min of walking while on his drive. He needs to elevate feet as much as possible. Avoid salt. Seek UC or ED for increase in swelling from present state, change in color to foot or s/s of DVT as previous. Pt and wife agree with recommendations. Pt again urged to seek UC tonight, but still refuses.  Pt made appt to see Dr. Elease Hashimoto for this coming Monday when he will be back in town.  Dr. Elease Hashimoto - FYI. Thanks!

## 2016-10-03 NOTE — Telephone Encounter (Signed)
Rx ready for pick up and patient is aware 

## 2016-10-03 NOTE — Telephone Encounter (Signed)
done

## 2016-10-04 NOTE — Telephone Encounter (Signed)
Agree with advise given.

## 2016-10-08 ENCOUNTER — Ambulatory Visit: Payer: Medicare Other | Admitting: Family Medicine

## 2016-11-02 ENCOUNTER — Encounter: Payer: Self-pay | Admitting: Family Medicine

## 2016-11-02 ENCOUNTER — Ambulatory Visit (INDEPENDENT_AMBULATORY_CARE_PROVIDER_SITE_OTHER): Payer: Medicare Other | Admitting: Family Medicine

## 2016-11-02 VITALS — BP 130/80 | HR 58 | Temp 97.7°F | Wt 216.0 lb

## 2016-11-02 DIAGNOSIS — R05 Cough: Secondary | ICD-10-CM

## 2016-11-02 DIAGNOSIS — R600 Localized edema: Secondary | ICD-10-CM | POA: Diagnosis not present

## 2016-11-02 DIAGNOSIS — R059 Cough, unspecified: Secondary | ICD-10-CM

## 2016-11-02 LAB — BASIC METABOLIC PANEL
BUN: 15 mg/dL (ref 6–23)
CALCIUM: 9.2 mg/dL (ref 8.4–10.5)
CHLORIDE: 93 meq/L — AB (ref 96–112)
CO2: 32 meq/L (ref 19–32)
CREATININE: 0.96 mg/dL (ref 0.40–1.50)
GFR: 79.92 mL/min (ref >60.00)
GLUCOSE: 116 mg/dL — AB (ref 70–99)
Potassium: 4.1 mEq/L (ref 3.5–5.1)
Sodium: 130 mEq/L — ABNORMAL LOW (ref 135–145)

## 2016-11-02 LAB — BRAIN NATRIURETIC PEPTIDE: Pro B Natriuretic peptide (BNP): 133 pg/mL — ABNORMAL HIGH (ref 0.0–100.0)

## 2016-11-02 LAB — TSH: TSH: 0.9 u[IU]/mL (ref 0.35–4.50)

## 2016-11-02 MED ORDER — FUROSEMIDE 20 MG PO TABS: 20.0000 mg | ORAL_TABLET | Freq: Every day | ORAL | 1 refills | Status: DC

## 2016-11-02 NOTE — Progress Notes (Signed)
Subjective:     Patient ID: Jerry Fox, male   DOB: [birth year], 83 y.o.   MRN: [id number]  HPI Patient is seen for the following complaints  Bilateral leg edema worsening for at least one month. His edema seems to be throughout the day and not just of late in the day. He denies any dyspnea or orthopnea. His weight is essentially unchanged from last visit. No history of heart failure. He takes amlodipine 5 mg daily. Does not take any regular nonsteroidals. No exacerbating or alleviating factors. Elevation does not seem to help much.  No pleuritic pain  Wife relates he's had some cough now for couple weeks. She is getting over a "bronchitis ".  No fever or chills.  No wheezing. Nonsmoker.  Past Medical History:  Diagnosis Date  . AAA (abdominal aortic aneurysm) ([place]) 2/14   3.8 cm 2/14  . Arthritis   . Atrial fibrillation ([place]) 2/14   acute pulmonary embolus  . Colon polyps   . Hyperlipidemia   . Hypertension   . Murmur, cardiac   . Saddle pulmonary embolus ([place]) 2/14   LLE DVT   Past Surgical History:  Procedure Laterality Date  . TONSILLECTOMY      reports that he quit smoking about 62 years ago. His smoking use included Cigarettes. He has a 10.00 pack-year smoking history. He quit smokeless tobacco use about 52 years ago. His smokeless tobacco use included Chew. He reports that he does not drink alcohol. His drug history is not on file. family history includes Arthritis in his father and mother; Heart disease in his father, mother, and paternal grandfather; Hypertension in his father. Allergies  Allergen Reactions  . Diltiazem Rash     Review of Systems  Constitutional: Negative for appetite change, fatigue and unexpected weight change.  Eyes: Negative for visual disturbance.  Respiratory: Negative for cough, chest tightness and shortness of breath.   Cardiovascular: Positive for leg swelling. Negative for chest pain and palpitations.  Neurological: Negative for  dizziness, syncope, weakness, light-headedness and headaches.       Objective:   Physical Exam  Constitutional: He is oriented to person, place, and time. He appears well-developed and well-nourished.  HENT:  Right Ear: External ear normal.  Left Ear: External ear normal.  Mouth/Throat: Oropharynx is clear and moist.  Eyes: Pupils are equal, round, and reactive to light.  Neck: Neck supple. No thyromegaly present.  Cardiovascular: Normal rate and regular rhythm.   Pulmonary/Chest: Effort normal and breath sounds normal. No respiratory distress. He has no wheezes. He has no rales.  Musculoskeletal: He exhibits edema.  Patient has trace to 1+ pitting edema feet and lower legs and ankles bilaterally. Feet are warm to touch with good distal pulses and good capillary refill  Neurological: He is alert and oriented to person, place, and time.       Assessment:     #1 Bilateral leg edema. Probably multifactorial. Patient is on amlodipine but fairly low dosage. Probably also has some venous stasis and possibly increased sodium intake recently  #2 cough-suspect acute viral bronchitis    Plan:     -Check further labs with basic metabolic panel, TSH, BNP level -Elevate legs frequently -Start low-dose furosemide 20 mg once daily for the next 3-4 days and then leave off -Consider compression hose if symptoms persist  [name] MD [facility]

## 2016-11-02 NOTE — Patient Instructions (Signed)
Edema Edema is an abnormal buildup of fluids in your bodytissues. Edema is somewhatdependent on gravity to pull the fluid to the lowest place in your body. That makes the condition more common in the legs and thighs (lower extremities). Painless swelling of the feet and ankles is common and becomes more likely as you get older. It is also common in looser tissues, like around your eyes. When the affected area is squeezed, the fluid may move out of that spot and leave a dent for a few moments. This dent is called pitting. What are the causes? There are many possible causes of edema. Eating too much salt and being on your feet or sitting for a long time can cause edema in your legs and ankles. Hot weather may make edema worse. Common medical causes of edema include:  Heart failure.  Liver disease.  Kidney disease.  Weak blood vessels in your legs.  Cancer.  An injury.  Pregnancy.  Some medications.  Obesity.  What are the signs or symptoms? Edema is usually painless.Your skin may look swollen or shiny. How is this diagnosed? Your health care provider may be able to diagnose edema by asking about your medical history and doing a physical exam. You may need to have tests such as X-rays, an electrocardiogram, or blood tests to check for medical conditions that may cause edema. How is this treated? Edema treatment depends on the cause. If you have heart, liver, or kidney disease, you need the treatment appropriate for these conditions. General treatment may include:  Elevation of the affected body part above the level of your heart.  Compression of the affected body part. Pressure from elastic bandages or support stockings squeezes the tissues and forces fluid back into the blood vessels. This keeps fluid from entering the tissues.  Restriction of fluid and salt intake.  Use of a water pill (diuretic). These medications are appropriate only for some types of edema. They pull fluid  out of your body and make you urinate more often. This gets rid of fluid and reduces swelling, but diuretics can have side effects. Only use diuretics as directed by your health care provider.  Follow these instructions at home:  Keep the affected body part above the level of your heart when you are lying down.  Do not sit still or stand for prolonged periods.  Do not put anything directly under your knees when lying down.  Do not wear constricting clothing or garters on your upper legs.  Exercise your legs to work the fluid back into your blood vessels. This may help the swelling go down.  Wear elastic bandages or support stockings to reduce ankle swelling as directed by your health care provider.  Eat a low-salt diet to reduce fluid if your health care provider recommends it.  Only take medicines as directed by your health care provider. Contact a health care provider if:  Your edema is not responding to treatment.  You have heart, liver, or kidney disease and notice symptoms of edema.  You have edema in your legs that does not improve after elevating them.  You have sudden and unexplained weight gain. Get help right away if:  You develop shortness of breath or chest pain.  You cannot breathe when you lie down.  You develop pain, redness, or warmth in the swollen areas.  You have heart, liver, or kidney disease and suddenly get edema.  You have a fever and your symptoms suddenly get worse. This information is   not intended to replace advice given to you by your health care provider. Make sure you discuss any questions you have with your health care provider. Document Released: 04/16/2005 Document Revised: 09/22/2015 Document Reviewed: 02/06/2013 Elsevier Interactive Patient Education  2017 [place] legs as much as possible Try to keep sodium intake < 2,000 mg daily Take the Furosemide 20 mg once daily for 3-4 days and then leave off.

## 2016-11-05 ENCOUNTER — Encounter: Payer: Self-pay | Admitting: Family Medicine

## 2016-11-26 ENCOUNTER — Ambulatory Visit: Payer: Self-pay | Admitting: Family Medicine

## 2016-11-27 ENCOUNTER — Encounter: Payer: Self-pay | Admitting: Family Medicine

## 2016-11-27 ENCOUNTER — Ambulatory Visit (INDEPENDENT_AMBULATORY_CARE_PROVIDER_SITE_OTHER): Payer: Medicare Other | Admitting: Family Medicine

## 2016-11-27 VITALS — BP 100/60 | HR 65 | Temp 97.9°F | Wt 218.2 lb

## 2016-11-27 DIAGNOSIS — I714 Abdominal aortic aneurysm, without rupture, unspecified: Secondary | ICD-10-CM

## 2016-11-27 DIAGNOSIS — E871 Hypo-osmolality and hyponatremia: Secondary | ICD-10-CM

## 2016-11-27 DIAGNOSIS — R1013 Epigastric pain: Secondary | ICD-10-CM

## 2016-11-27 DIAGNOSIS — R1012 Left upper quadrant pain: Secondary | ICD-10-CM | POA: Diagnosis not present

## 2016-11-27 LAB — BASIC METABOLIC PANEL
BUN: 22 mg/dL (ref 6–23)
CHLORIDE: 99 meq/L (ref 96–112)
CO2: 34 mEq/L — ABNORMAL HIGH (ref 19–32)
CREATININE: 0.92 mg/dL (ref 0.40–1.50)
Calcium: 9.1 mg/dL (ref 8.4–10.5)
GFR: 83.93 mL/min (ref >60.00)
GLUCOSE: 100 mg/dL — AB (ref 70–99)
POTASSIUM: 3.6 meq/L (ref 3.5–5.1)
Sodium: 139 mEq/L (ref 135–145)

## 2016-11-27 LAB — CBC WITH DIFFERENTIAL/PLATELET
BASOS PCT: 0.6 % (ref 0.0–3.0)
Basophils Absolute: 0 10*3/uL (ref 0.0–0.1)
EOS ABS: 0.1 10*3/uL (ref 0.0–0.7)
EOS PCT: 1.4 % (ref 0.0–5.0)
HCT: 39.1 % (ref 39.0–52.0)
Hemoglobin: 13.4 g/dL (ref 13.0–17.0)
LYMPHS ABS: 2.6 10*3/uL (ref 0.7–4.0)
Lymphocytes Relative: 32.3 % (ref 12.0–46.0)
MCHC: 34.2 g/dL (ref 30.0–36.0)
MCV: 96.2 fl (ref 78.0–100.0)
MONO ABS: 0.8 10*3/uL (ref 0.1–1.0)
Monocytes Relative: 9.7 % (ref 3.0–12.0)
NEUTROS ABS: 4.5 10*3/uL (ref 1.4–7.7)
NEUTROS PCT: 56 % (ref 43.0–77.0)
PLATELETS: 269 10*3/uL (ref 150.0–400.0)
RBC: 4.06 Mil/uL — ABNORMAL LOW (ref 4.22–5.81)
RDW: 13.3 % (ref 11.5–15.5)
WBC: 8 10*3/uL (ref 4.0–10.5)

## 2016-11-27 LAB — HEPATIC FUNCTION PANEL
ALT: 14 U/L (ref 0–53)
AST: 14 U/L (ref 0–37)
Albumin: 3.8 g/dL (ref 3.5–5.2)
Alkaline Phosphatase: 44 U/L (ref 39–117)
BILIRUBIN TOTAL: 0.4 mg/dL (ref 0.2–1.2)
Bilirubin, Direct: 0.1 mg/dL (ref 0.0–0.3)
Total Protein: 6.4 g/dL (ref 6.0–8.3)

## 2016-11-27 NOTE — Progress Notes (Signed)
Subjective:     Patient ID: Jerry Fox, male   DOB: Mar 30, 1934, 83 y.o.   MRN: [id number]  HPI Patient seen for the following issues  Recent bilateral leg edema. BNP levels normal. Basic metabolic panel significant for sodium 130. He takes HCTZ 25 mg daily was instructed to reduce this to one half tablet. They apparently did not do so. His edema is improved. Denies any nausea or vomiting.  No prior history of hyponatremia. Generally good intake and he in fact has gained 2 pounds last visit.  He complains today of new complaint of epigastric and left upper quadrant pain for about 2 months. Progressively get worse and more constant. Worse after eating. Dull pain. No alleviating factors. Denies a nausea or vomiting. No melena. Not positional. No chest pains. No dyspnea.  No nonsteroidal use. No alcohol.  Patient does have history of abdominal aortic aneurysm and has not had follow-up in a few years  He has past history of pulmonary emboli and remains on Xarelto  Past Medical History:  Diagnosis Date  . AAA (abdominal aortic aneurysm) ([place]) 2/14   3.8 cm 2/14  . Arthritis   . Atrial fibrillation ([place]) 2/14   acute pulmonary embolus  . Colon polyps   . Hyperlipidemia   . Hypertension   . Murmur, cardiac   . Saddle pulmonary embolus ([place]) 2/14   LLE DVT   Past Surgical History:  Procedure Laterality Date  . TONSILLECTOMY      reports that he quit smoking about 62 years ago. His smoking use included Cigarettes. He has a 10.00 pack-year smoking history. He quit smokeless tobacco use about 52 years ago. His smokeless tobacco use included Chew. He reports that he does not drink alcohol. His drug history is not on file. family history includes Arthritis in his father and mother; Heart disease in his father, mother, and paternal grandfather; Hypertension in his father. Allergies  Allergen Reactions  . Diltiazem Rash     Review of Systems  Constitutional: Negative for appetite  change, chills, fever and unexpected weight change.  Respiratory: Negative for shortness of breath.   Cardiovascular: Negative for chest pain.  Gastrointestinal: Positive for abdominal pain. Negative for abdominal distention, blood in stool, constipation, diarrhea, nausea and vomiting.  Genitourinary: Negative for dysuria.       Objective:   Physical Exam  Constitutional: He appears well-developed and well-nourished.  Cardiovascular: Normal rate and regular rhythm.   Pulmonary/Chest: Effort normal and breath sounds normal. No respiratory distress. He has no wheezes. He has no rales.  Abdominal: Soft. Bowel sounds are normal. He exhibits no distension and no mass. There is no tenderness. There is no rebound and no guarding.  Musculoskeletal:  Edema much improved compared to last visit       Assessment:     #1 bilateral leg edema improved  #2 hyponatremia probably related HCTZ  #3 epigastric and left upper quadrant abdominal pain. Etiology unclear. He is not any red flags such as appetite change, weight loss, nausea or vomiting, melena, etc.  #4 history of abdominal aortic aneurysm overdue for follow-up    Plan:     -Check further labs today with repeat basic metabolic panel, CBC, hepatic panel -Set up complete abdominal ultrasound to further assess -If above normal and pain persist consider either upper GI or referral to GI for further evaluation -If sodium remains low either reduce or discontinue HCTZ  [name] MD [facility]

## 2016-11-27 NOTE — Patient Instructions (Signed)
We will set up abdominal ultrasound to further assess your abdominal pain.

## 2016-12-06 ENCOUNTER — Other Ambulatory Visit: Payer: Medicare Other

## 2016-12-21 ENCOUNTER — Telehealth: Payer: Self-pay | Admitting: Family Medicine

## 2016-12-21 NOTE — Telephone Encounter (Signed)
[facility] - Client [place] [facility]     Patient Name: MATHEAU ORONA Initial Comment Caller states that her husband has edema, his legs are swollen and is BP is 160/82 pulse 53.  DOB: 08-Feb-1934      Nurse Assessment  Nurse: Georgina Peer, RN, Kendrick Fries Date/Time (Eastern Time): 12/21/2016 4:11:05 PM  Confirm and document reason for call. If symptomatic, describe symptoms. ---Caller states that her husband has edema, his legs are swollen tight, feet up to the knees, and is BP is 160/82, pulse 53. No other c/o. B/p is now 146/82. No cracks in the skin or fluid oozing.  Does the patient have any new or worsening symptoms? ---Yes  Will a triage be completed? ---Yes  Related visit to physician within the last 2 weeks? ---No  Does the PT have any chronic conditions? (i.e. diabetes, asthma, etc.) ---Yes  List chronic conditions. ---abdominal aortic aneurysm; HTN, on blood thinners; a.fib; high cholesterol  Is this a behavioral health or substance abuse call? ---No    Guidelines     Guideline Title Affirmed Question Affirmed Notes   Leg Swelling and Edema [1] MODERATE leg swelling (e.g., swelling extends up to knees) AND [2] new onset or worsening    Final Disposition User   See Physician within [place], RN, [facility]     Referrals   [facility]   Disagree/Comply: Comply

## 2016-12-21 NOTE — Telephone Encounter (Signed)
[facility] - Client [place] [facility]     Patient Name: Jerry Fox Initial Comment Caller states that her husband has edema, his legs are swollen and is BP is 160/82 pulse 53.  DOB: Jun 20, 1933      Nurse Assessment  Nurse: Georgina Peer, RN, Kendrick Fries Date/Time (Eastern Time): 12/21/2016 4:11:05 PM  Confirm and document reason for call. If symptomatic, describe symptoms. ---Caller states that her husband has edema, his legs are swollen tight, feet up to the knees, and is BP is 160/82, pulse 53. No other c/o. B/p is now 146/82. No cracks in the skin or fluid oozing.  Does the patient have any new or worsening symptoms? ---Yes  Will a triage be completed? ---Yes  Related visit to physician within the last 2 weeks? ---No  Does the PT have any chronic conditions? (i.e. diabetes, asthma, etc.) ---Yes  List chronic conditions. ---abdominal aortic aneurysm; HTN, on blood thinners; a.fib; high cholesterol  Is this a behavioral health or substance abuse call? ---No    Guidelines     Guideline Title Affirmed Question Affirmed Notes   Leg Swelling and Edema [1] MODERATE leg swelling (e.g., swelling extends up to knees) AND [2] new onset or worsening    Final Disposition User   See Physician within [place], RN, [facility]     Referrals   [facility]   Disagree/Comply: Comply

## 2016-12-24 ENCOUNTER — Telehealth: Payer: Self-pay | Admitting: *Deleted

## 2016-12-24 MED ORDER — LOSARTAN POTASSIUM 50 MG PO TABS: 50.0000 mg | ORAL_TABLET | Freq: Every day | ORAL | 1 refills | Status: DC

## 2016-12-24 NOTE — Telephone Encounter (Signed)
Patient has switched pharmacies and his Rx for Diovan 80 mg is not available because of the wide spread recall.  Would you consider changing to another medication in this class? Message from [employer] [phone]

## 2016-12-24 NOTE — Telephone Encounter (Signed)
Spoke with pt and he states that the symptoms have not changed much. He would like to come see Dr. Elease Hashimoto tomorrow after his ultrasound. Appt scheduled with Dr. Elease Hashimoto, pt aware. Nothing further needed at this time.

## 2016-12-24 NOTE — Telephone Encounter (Signed)
Switch to losartan 50mg once daily.

## 2016-12-25 ENCOUNTER — Ambulatory Visit
Admission: RE | Admit: 2016-12-25 | Discharge: 2016-12-25 | Disposition: A | Discharge status: Home / Self Care | Payer: Medicare Other | Source: Ambulatory Visit | Attending: Family Medicine | Admitting: Family Medicine

## 2016-12-25 ENCOUNTER — Encounter: Payer: Self-pay | Admitting: Family Medicine

## 2016-12-25 ENCOUNTER — Ambulatory Visit (INDEPENDENT_AMBULATORY_CARE_PROVIDER_SITE_OTHER): Payer: Medicare Other | Admitting: Family Medicine

## 2016-12-25 VITALS — BP 130/80 | HR 58 | Temp 97.4°F | Wt 217.2 lb

## 2016-12-25 DIAGNOSIS — R1012 Left upper quadrant pain: Secondary | ICD-10-CM

## 2016-12-25 DIAGNOSIS — R600 Localized edema: Secondary | ICD-10-CM | POA: Diagnosis not present

## 2016-12-25 DIAGNOSIS — I714 Abdominal aortic aneurysm, without rupture, unspecified: Secondary | ICD-10-CM

## 2016-12-25 DIAGNOSIS — R1011 Right upper quadrant pain: Secondary | ICD-10-CM | POA: Diagnosis not present

## 2016-12-25 DIAGNOSIS — R1013 Epigastric pain: Secondary | ICD-10-CM

## 2016-12-25 NOTE — Progress Notes (Signed)
Subjective:     Patient ID: Jerry Fox, male   DOB: [birth year], 83 y.o.   MRN: [id number]  HPI Patient seen with increased bilateral leg edema. Refer to previous notes. Normal BNP level back in July. We prescribed low-dose furosemide 20 mg daily but he is not taking this regularly. His weight is essentially unchanged over the past month. He denies any orthopnea. No PND.  Patient had ultrasound earlier day for some upper abdominal vague discomfort which has been for several weeks. No loss of appetite. Weight stable. No chest pains. Wife states he has been poorly compiled diet at times eating lots of high sodium foods.  No stool changes.  No nausea or vomiting.  Past Medical History:  Diagnosis Date  . AAA (abdominal aortic aneurysm) ([place]) 2/14   3.8 cm 2/14  . Arthritis   . Atrial fibrillation ([place]) 2/14   acute pulmonary embolus  . Colon polyps   . Hyperlipidemia   . Hypertension   . Murmur, cardiac   . Saddle pulmonary embolus ([place]) 2/14   LLE DVT   Past Surgical History:  Procedure Laterality Date  . TONSILLECTOMY      reports that he quit smoking about 62 years ago. His smoking use included Cigarettes. He has a 10.00 pack-year smoking history. He quit smokeless tobacco use about 52 years ago. His smokeless tobacco use included Chew. He reports that he does not drink alcohol. His drug history is not on file. family history includes Arthritis in his father and mother; Heart disease in his father, mother, and paternal grandfather; Hypertension in his father. Allergies  Allergen Reactions  . Diltiazem Rash     Review of Systems  Constitutional: Negative for fatigue.  Eyes: Negative for visual disturbance.  Respiratory: Negative for cough, chest tightness and shortness of breath.   Cardiovascular: Positive for leg swelling. Negative for chest pain and palpitations.  Neurological: Negative for dizziness, syncope, weakness, light-headedness and headaches.       Objective:    Physical Exam  Constitutional: He is oriented to person, place, and time. He appears well-developed and well-nourished.  Neck: Neck supple.  Cardiovascular: Normal rate and regular rhythm.  Exam reveals no gallop.   Pulmonary/Chest: Effort normal and breath sounds normal. No respiratory distress. He has no wheezes. He has no rales.  Musculoskeletal: He exhibits edema.  Patient has trace to 1+ pitting edema lower legs bilaterally. Excellent capillary refill bilaterally  Neurological: He is alert and oriented to person, place, and time.       Assessment:     #1 bilateral leg edema. No history of heart failure. Suspect mostly related to venous stasis and possibly diastolic dysfunction. Weight stable  #2 vague upper abdominal pains. Patient received abdominal ultrasound earlier today which is pending    Plan:     -Follow-up ultrasound as above -Start back furosemide 20 mg once daily -Elevate legs frequently -Prescription for compression garments 20-30 mmHg knee-high -Reassess in 3 weeks  [name] MD [facility] at [facility]

## 2016-12-25 NOTE — Patient Instructions (Addendum)
Elevate legs frequently Start the compression garments May start back the Lasix 20 mg once daily for 3-4 days-may double up to two tablets once daily if needed. Continue with daily body weights.

## 2016-12-26 ENCOUNTER — Telehealth: Payer: Self-pay | Admitting: Family Medicine

## 2016-12-26 NOTE — Telephone Encounter (Signed)
We had discussed him being off this. I know he does not take regularly.  Prefer he take regular Tylenol and try to get away from the Hydrocodone.

## 2016-12-26 NOTE — Telephone Encounter (Signed)
Pt need new Rx for Hydrocdone   Pt is aware of 3 business days for refills and someone will call when ready for pick up.

## 2016-12-26 NOTE — Telephone Encounter (Signed)
Last refill 10/03/16 and last office visit 12/25/16.  Okay to fill?

## 2016-12-28 MED ORDER — HYDROCODONE-ACETAMINOPHEN 5-325 MG PO TABS: 1.0000 | ORAL_TABLET | Freq: Four times a day (QID) | ORAL | 0 refills | Status: DC | PRN

## 2016-12-28 NOTE — Telephone Encounter (Signed)
I spoke with patient. He has been taking the extra strength tylenol's. He said that he can take 2 and that it doesn't help. He doesn't take the Hydrocodone often at all, he even still has 6 pills left. He said that he only takes it when his back pain flares up and he likes to have it on hand in case he needs it.

## 2016-12-28 NOTE — Telephone Encounter (Signed)
Rx printed for signature. 

## 2016-12-28 NOTE — Telephone Encounter (Signed)
We can refill #20.  Current laws do not allow dispensing more than 5 days worth for prn use.

## 2016-12-28 NOTE — Telephone Encounter (Signed)
I called and spoke with patient. He is aware the Rx is up front & ready for pick up.

## 2017-01-03 ENCOUNTER — Telehealth: Payer: Self-pay | Admitting: Family Medicine

## 2017-01-03 NOTE — Telephone Encounter (Signed)
Copy mailed to home address per patient request 

## 2017-01-03 NOTE — Telephone Encounter (Signed)
Pt wife is requesting a copy of blood work and ultrasound to be mail to home address

## 2017-01-15 ENCOUNTER — Encounter: Payer: Self-pay | Admitting: Family Medicine

## 2017-01-15 ENCOUNTER — Ambulatory Visit (INDEPENDENT_AMBULATORY_CARE_PROVIDER_SITE_OTHER): Payer: Medicare Other | Admitting: Family Medicine

## 2017-01-15 VITALS — BP 160/70 | HR 66 | Temp 98.1°F | Wt 221.3 lb

## 2017-01-15 DIAGNOSIS — I10 Essential (primary) hypertension: Secondary | ICD-10-CM | POA: Diagnosis not present

## 2017-01-15 DIAGNOSIS — K760 Fatty (change of) liver, not elsewhere classified: Secondary | ICD-10-CM

## 2017-01-15 DIAGNOSIS — R600 Localized edema: Secondary | ICD-10-CM

## 2017-01-15 MED ORDER — LISINOPRIL 20 MG PO TABS: 20.0000 mg | ORAL_TABLET | Freq: Every day | ORAL | 3 refills | Status: DC

## 2017-01-15 MED ORDER — FUROSEMIDE 40 MG PO TABS: 40.0000 mg | ORAL_TABLET | Freq: Every day | ORAL | 3 refills | Status: DC

## 2017-01-15 MED ORDER — HYDROCODONE-ACETAMINOPHEN 5-325 MG PO TABS: 1.0000 | ORAL_TABLET | Freq: Four times a day (QID) | ORAL | 0 refills | Status: DC | PRN

## 2017-01-15 MED ORDER — POTASSIUM CHLORIDE ER 10 MEQ PO TBCR: 10.0000 meq | EXTENDED_RELEASE_TABLET | Freq: Every day | ORAL | 3 refills | Status: DC

## 2017-01-15 NOTE — Progress Notes (Signed)
Subjective:     Patient ID: Jerry Fox, male   DOB: 03-16-1934, 83 y.o.   MRN: [id number]  HPI Patient seen for medical follow-up. Recent bilateral leg edema. We started back furosemide 20 mg daily and weight is actually up 4 pounds today. Recent BNP level was only 133. Renal function normal. He states he has not had any dietary changes. Not elevating legs. We prescribed compression garments last visit that he never started those and refuses at this time.  Other medical problems include history of pulmonary embolus, hypertension, atrial fibrillation, chronic low back pain, hyperlipidemia Denies any orthopnea.  In reviewing his medication list he has both losartan and lisinopril on his list. He does not think he is taking losartan.  Recent ultrasound revealed fatty liver changes.  Past Medical History:  Diagnosis Date  . AAA (abdominal aortic aneurysm) ([place]) 2/14   3.8 cm 2/14  . Arthritis   . Atrial fibrillation ([place]) 2/14   acute pulmonary embolus  . Colon polyps   . Hyperlipidemia   . Hypertension   . Murmur, cardiac   . Saddle pulmonary embolus ([place]) 2/14   LLE DVT   Past Surgical History:  Procedure Laterality Date  . TONSILLECTOMY      reports that he quit smoking about 63 years ago. His smoking use included Cigarettes. He has a 10.00 pack-year smoking history. He quit smokeless tobacco use about 52 years ago. His smokeless tobacco use included Chew. He reports that he does not drink alcohol. His drug history is not on file. family history includes Arthritis in his father and mother; Heart disease in his father, mother, and paternal grandfather; Hypertension in his father. Allergies  Allergen Reactions  . Diltiazem Rash      Review of Systems  Constitutional: Negative for appetite change and fatigue.  Eyes: Negative for visual disturbance.  Respiratory: Negative for cough, chest tightness and shortness of breath.   Cardiovascular: Positive for leg swelling.  Negative for chest pain and palpitations.  Neurological: Negative for dizziness, syncope, weakness, light-headedness and headaches.       Objective:   Physical Exam  Constitutional: He is oriented to person, place, and time. He appears well-developed and well-nourished.  HENT:  Right Ear: External ear normal.  Left Ear: External ear normal.  Mouth/Throat: Oropharynx is clear and moist.  Eyes: Pupils are equal, round, and reactive to light.  Neck: Neck supple. No thyromegaly present.  Cardiovascular: Normal rate and regular rhythm.   Pulmonary/Chest: Effort normal and breath sounds normal. No respiratory distress. He has no wheezes. He has no rales.  Musculoskeletal: He exhibits edema.  Neurological: He is alert and oriented to person, place, and time.       Assessment:     #1 hypertension poorly controlled on today's visit with elevated systolic reading  #2 bilateral leg edema not improved with low-dose furosemide. Suspect related to venous stasis and diastolic dysfunction predominantly.  May need to rule out obstructive sleep apnea-though no observed episodes.  #3 fatty liver changes on recent ultrasound    Plan:     -make sure is not taking losartan concomitant with lisinopril (they will check at home to make sure) -Increase lisinopril 20 mg daily -Increased furosemide to 40 mg daily -Add K-Lor 10 mEq once daily -Elevate legs frequently -Keep sodium intake less than 2000 mg daily -Office follow-up 2 weeks to reassess. Discussed possible further evaluation to rule out obstructive sleep apnea then  [name] MD [facility]  at [facility]

## 2017-01-15 NOTE — Patient Instructions (Signed)
Increase the Lisinopril to 20 mg once daily Increase the Furosemide to 40 mg once daily Make sure OFF the Losartan.

## 2017-01-17 ENCOUNTER — Encounter: Payer: Self-pay | Admitting: Family Medicine

## 2017-01-29 ENCOUNTER — Ambulatory Visit (INDEPENDENT_AMBULATORY_CARE_PROVIDER_SITE_OTHER): Payer: Medicare Other | Admitting: Family Medicine

## 2017-01-29 ENCOUNTER — Encounter: Payer: Self-pay | Admitting: Family Medicine

## 2017-01-29 ENCOUNTER — Ambulatory Visit: Payer: Medicare Other | Admitting: Family Medicine

## 2017-01-29 VITALS — BP 110/60 | HR 70 | Temp 98.2°F | Wt 218.8 lb

## 2017-01-29 DIAGNOSIS — R600 Localized edema: Secondary | ICD-10-CM | POA: Diagnosis not present

## 2017-01-29 DIAGNOSIS — I10 Essential (primary) hypertension: Secondary | ICD-10-CM | POA: Diagnosis not present

## 2017-01-29 DIAGNOSIS — G8929 Other chronic pain: Secondary | ICD-10-CM

## 2017-01-29 DIAGNOSIS — M545 Low back pain, unspecified: Secondary | ICD-10-CM

## 2017-01-29 MED ORDER — HYDROCODONE-ACETAMINOPHEN 10-325 MG PO TABS: 1.0000 | ORAL_TABLET | Freq: Four times a day (QID) | ORAL | 0 refills | Status: DC | PRN

## 2017-01-29 NOTE — Patient Instructions (Signed)
Increase Furosemide to 40 mg in AM and 20 mg (one half) in PM for 4 days and then drop back to 40 mg once daily.

## 2017-01-29 NOTE — Progress Notes (Signed)
Subjective:     Patient ID: Jerry Fox, male   DOB: Apr 22, 1934, 83 y.o.   MRN: [id number]  HPI Patient seen for follow-up regarding hypertension, bilateral leg edema. His weight is down 3 pounds. Blood pressure was elevated at 160/70 last visit. We increased his lisinopril to 20 mg daily and bumped his furosemide to 40 mg daily. Still has some bilateral leg edema but slightly improved. No orthopnea. No dyspnea with exertion. He is trying to watch sodium intake closely. No history of known systolic failure. Has had previous saddle pulmonary embolus. Also takes amlodipine 5 mg daily for hypertension.  Chronic back difficulties. We have tried to discourage regular use of opioids if pain adequately controlled withTylenol. He still has occasional severe pain and has taken as needed hydrocodone  We had considered possible obstructive sleep apnea with increasing edema and poorly controlled blood pressure but wife has not observed any appetite episodes. He has no daytime somnolence.  Past Medical History:  Diagnosis Date  . AAA (abdominal aortic aneurysm) ([place]) 2/14   3.8 cm 2/14  . Arthritis   . Atrial fibrillation ([place]) 2/14   acute pulmonary embolus  . Colon polyps   . Hyperlipidemia   . Hypertension   . Murmur, cardiac   . Saddle pulmonary embolus ([place]) 2/14   LLE DVT   Past Surgical History:  Procedure Laterality Date  . TONSILLECTOMY      reports that he quit smoking about 63 years ago. His smoking use included Cigarettes. He has a 10.00 pack-year smoking history. He quit smokeless tobacco use about 53 years ago. His smokeless tobacco use included Chew. He reports that he does not drink alcohol. His drug history is not on file. family history includes Arthritis in his father and mother; Heart disease in his father, mother, and paternal grandfather; Hypertension in his father. Allergies  Allergen Reactions  . Diltiazem Rash     Review of Systems  Constitutional: Negative for  fatigue.  Eyes: Negative for visual disturbance.  Respiratory: Negative for cough, chest tightness and shortness of breath.   Cardiovascular: Positive for leg swelling. Negative for chest pain and palpitations.  Neurological: Negative for dizziness, syncope, weakness, light-headedness and headaches.       Objective:   Physical Exam  Constitutional: He is oriented to person, place, and time. He appears well-developed and well-nourished.  HENT:  Right Ear: External ear normal.  Left Ear: External ear normal.  Mouth/Throat: Oropharynx is clear and moist.  Eyes: Pupils are equal, round, and reactive to light.  Neck: Neck supple. No thyromegaly present.  Cardiovascular: Normal rate and regular rhythm.   Pulmonary/Chest: Effort normal and breath sounds normal. No respiratory distress. He has no wheezes. He has no rales.  Musculoskeletal: He exhibits edema.  Neurological: He is alert and oriented to person, place, and time.       Assessment:     #1 bilateral leg edema. Slightly improved. Still has some pitting edema.  Weight down 3 pounds  #2 hypertension greatly improved with recent increase in lisinopril and furosemide  #3 chronic low back pain    Plan:     -check basic metabolic panel -Increased furosemide to 40 mg morning and 20 mg afternoon for the next 5 days and then try dropping back to 40 mg daily -Monitor daily weights -Refill hydrocodone 10 mg #20 one every 6 hours as needed for severe pain and avoid regular use  [name] MD [facility] at [facility]

## 2017-02-12 ENCOUNTER — Telehealth: Payer: Self-pay | Admitting: *Deleted

## 2017-02-12 NOTE — Telephone Encounter (Signed)
We discussed this multiple times.  If requiring regular use of hydrocodone we can set up with pain management.

## 2017-02-12 NOTE — Telephone Encounter (Signed)
Patient called stating he needs another refill on the hydrocodone. Please advise

## 2017-02-12 NOTE — Telephone Encounter (Signed)
Patient is aware 

## 2017-02-12 NOTE — Telephone Encounter (Signed)
Last refill and office visit 01/29/17.  Okay to fill?

## 2017-03-20 ENCOUNTER — Other Ambulatory Visit: Payer: Self-pay | Admitting: Family Medicine

## 2017-04-01 ENCOUNTER — Encounter: Payer: Self-pay | Admitting: Family Medicine

## 2017-04-01 ENCOUNTER — Ambulatory Visit: Payer: Medicare Other | Admitting: Family Medicine

## 2017-04-01 VITALS — BP 120/78 | HR 63 | Temp 97.6°F | Wt 215.3 lb

## 2017-04-01 DIAGNOSIS — M545 Low back pain: Secondary | ICD-10-CM

## 2017-04-01 DIAGNOSIS — G8929 Other chronic pain: Secondary | ICD-10-CM | POA: Diagnosis not present

## 2017-04-01 DIAGNOSIS — Z23 Encounter for immunization: Secondary | ICD-10-CM | POA: Diagnosis not present

## 2017-04-01 MED ORDER — HYDROCODONE-ACETAMINOPHEN 10-325 MG PO TABS: 1.0000 | ORAL_TABLET | Freq: Four times a day (QID) | ORAL | 0 refills | Status: DC | PRN

## 2017-04-01 NOTE — Progress Notes (Signed)
Subjective:     Patient ID: Jerry Fox, male   DOB: March 29, 1934, 83 y.o.   MRN: [id number]  HPI Patient here to discuss low back pain. He's had several years of intermittent back pain and has seen Dr. Nelva Bush in the past. He had lumbar spine films back in 2017 which showed only mild degenerative changes. MRI lumbar spine 2010 showed degenerative changes  Location is lower lumbar area. No radiculopathy symptoms. Denies any loss of urine or stool control. He has a deep achy to occasionally sharp pain which is worse with movement and sometimes with ambulation but not when sitting completely still. He's tried Tylenol without relief. He took a couple doses of Advil. He is on chronic anticoagulation with Xarelto and has been cautioned about any further use of nonsteroidals. Denies any recent appetite or weight changes.  Past Medical History:  Diagnosis Date  . AAA (abdominal aortic aneurysm) ([place]) 2/14   3.8 cm 2/14  . Arthritis   . Atrial fibrillation ([place]) 2/14   acute pulmonary embolus  . Colon polyps   . Hyperlipidemia   . Hypertension   . Murmur, cardiac   . Saddle pulmonary embolus ([place]) 2/14   LLE DVT   Past Surgical History:  Procedure Laterality Date  . TONSILLECTOMY      reports that he quit smoking about 63 years ago. His smoking use included cigarettes. He has a 10.00 pack-year smoking history. He quit smokeless tobacco use about 53 years ago. His smokeless tobacco use included chew. He reports that he does not drink alcohol. His drug history is not on file. family history includes Arthritis in his father and mother; Heart disease in his father, mother, and paternal grandfather; Hypertension in his father. Allergies  Allergen Reactions  . Diltiazem Rash     Review of Systems  Constitutional: Negative for activity change, appetite change and fever.  Respiratory: Negative for cough and shortness of breath.   Cardiovascular: Negative for chest pain and leg swelling.   Gastrointestinal: Negative for abdominal pain and vomiting.  Genitourinary: Negative for dysuria, flank pain and hematuria.  Musculoskeletal: Positive for back pain. Negative for joint swelling.  Neurological: Negative for weakness and numbness.       Objective:   Physical Exam  Constitutional: He is oriented to person, place, and time. He appears well-developed and well-nourished. No distress.  Neck: No thyromegaly present.  Cardiovascular: Normal rate, regular rhythm and normal heart sounds.  No murmur heard. Pulmonary/Chest: Effort normal and breath sounds normal. No respiratory distress. He has no wheezes. He has no rales.  Musculoskeletal: He exhibits no edema.  Neurological: He is alert and oriented to person, place, and time. He has normal reflexes. No cranial nerve deficit.  Full-strength lower extremities.  Skin: No rash noted.       Assessment:     Chronic low back pain. Previous x-rays have shown mild degenerative changes. Nonfocal neuro exam. He's had progressive pain recently not controlled with Tylenol    Plan:     -We've recommended repeat trial of physical therapy. He had physical therapy years ago which did not seem to help this was estimated 10 years ago -Avoid further use of Advil with his Xarelto -Avoid chronic use of opioids. We did agree to one refill of hydrocodone to use only as needed for severe pain #20 with no refill -Touch base in 3-4 weeks of physical therapy not helping  [name] MD [facility] at [facility]

## 2017-04-01 NOTE — Patient Instructions (Signed)
We will set up some physical therapy and let me know in 3-4 weeks if no better.   Avoid further use of Advil or Aleve.

## 2017-04-05 ENCOUNTER — Encounter: Payer: Self-pay | Admitting: Physical Therapy

## 2017-04-05 ENCOUNTER — Ambulatory Visit: Payer: Medicare Other | Attending: Family Medicine | Admitting: Physical Therapy

## 2017-04-05 DIAGNOSIS — R293 Abnormal posture: Secondary | ICD-10-CM | POA: Insufficient documentation

## 2017-04-05 DIAGNOSIS — M545 Low back pain: Secondary | ICD-10-CM | POA: Insufficient documentation

## 2017-04-05 DIAGNOSIS — G8929 Other chronic pain: Secondary | ICD-10-CM | POA: Diagnosis present

## 2017-04-05 NOTE — Therapy (Signed)
[facility] [place], [place], [address] Phone: [phone]   Fax:  [phone]  Physical Therapy Evaluation  Patient Details  Name: Jerry Fox MRN: [id number] Date of Birth: 1933/10/14 Referring Provider: Carolann Littler MD.   Encounter Date: 04/05/2017  PT End of Session - 04/05/17 1056    Visit Number  1    Number of Visits  16    Date for PT Re-Evaluation  06/04/17    PT Start Time  1030    PT Stop Time  1120    PT Time Calculation (min)  50 min    Activity Tolerance  Patient tolerated treatment well    Behavior During Therapy  [facility] for tasks assessed/performed       Past Medical History:  Diagnosis Date  . AAA (abdominal aortic aneurysm) ([place]) 2/14   3.8 cm 2/14  . Arthritis   . Atrial fibrillation ([place]) 2/14   acute pulmonary embolus  . Colon polyps   . Hyperlipidemia   . Hypertension   . Murmur, cardiac   . Saddle pulmonary embolus ([place]) 2/14   LLE DVT    Past Surgical History:  Procedure Laterality Date  . TONSILLECTOMY      There were no vitals filed for this visit.   Subjective Assessment - 04/05/17 1059    Subjective  The patient has a long h/o low back pain over many years across his low back region.  His resting pain-level today is a 4/10 but can rise to 8/10 with increased activity.      Pertinent History  OA.  Aortic aneurysm.    Patient Stated Goals  X-ray:  Mild degenerative changes of lumbar spine.    Currently in Pain?  Yes    Pain Score  4     Pain Location  Back    Pain Orientation  Right;Left;Mid    Pain Descriptors / Indicators  Aching    Pain Type  Chronic pain    Pain Onset  More than a month ago    Pain Frequency  Constant    Aggravating Factors   Movement.    Pain Relieving Factors  Rest.         [facility] PT Assessment - 04/05/17 0001      Assessment   Medical Diagnosis  Chronic midline low back pain.    Referring Provider  Carolann Littler MD.    Onset  Date/Surgical Date  -- Many years.      Precautions   Precautions  None      Restrictions   Weight Bearing Restrictions  No      Balance Screen   Has the patient fallen in the past 6 months  No    Has the patient had a decrease in activity level because of a fear of falling?   No    Is the patient reluctant to leave their home because of a fear of falling?   No      Home Environment   Living Environment  Private residence      Prior Function   Level of Independence  Independent      Posture/Postural Control   Posture/Postural Control  Postural limitations    Postural Limitations  Rounded Shoulders;Forward head;Decreased lumbar lordosis      ROM / Strength   AROM / PROM / Strength  AROM;Strength      AROM   Overall AROM Comments  Lumbar extension limited to 5 degrees; invertebral movement into  flexion decreased by 50%.      Strength   Overall Strength Comments  Bilateral LE strength= 5/5.      Palpation   Palpation comment  C/o pain L4 to S1 across the patient's low back.  His lumbar erector spinae musculature is very taut to palpation.      Special Tests    Special Tests  Lumbar;Leg LengthTest Normal LE DTR's.    Lumbar Tests  -- (-) SLR testing and (-) FABER testing.    Leg length test   -- Equal leg lengths.      Ambulation/Gait   Gait Comments  Slow an dpurposeful in obvious pain.  Transitory movements are also performed slowly due to pain.             Objective measurements completed on examination: See above findings.      [facility] Adult PT Treatment/Exercise - 04/05/17 0001      Modalities   Modalities  Electrical Stimulation;Moist Heat      Moist Heat Therapy   Number Minutes Moist Heat  20 Minutes    Moist Heat Location  Lumbar Spine      Electrical Stimulation   Electrical Stimulation Location  Low back.    Electrical Stimulation Action  IFC    Electrical Stimulation Parameters  80-150 Hz x 20 minutes on 100% scan.    Electrical Stimulation  Goals  Tone;Pain               PT Short Term Goals - 04/05/17 1126      PT SHORT TERM GOAL #1   Title  STG's=LTG's.        PT Long Term Goals - 04/05/17 1126      PT LONG TERM GOAL #1   Title  Independent with a HEP.    Time  8    Period  Weeks    Status  New      PT LONG TERM GOAL #2   Title  Lumbar extension to 15 degrees actively.    Time  8    Period  Weeks    Status  New      PT LONG TERM GOAL #3   Title  Perform ADL's with pain not > 3-4/10.    Time  8    Period  Weeks    Status  New      PT LONG TERM GOAL #4   Title  Perform transitory movements with pain not > 3-4/10.    Time  8    Period  Weeks    Status  New             Plan - 04/05/17 1119    Clinical Impression Statement  The patient presents to [facility] wiht chronic low back pain.  He has limited spinal mobility and at times vey high pain-levels with certain movements.  His pain and deficits impair his functional mobility.  The patient will benefit from skilled physical therapy.    Clinical Presentation  Evolving    Clinical Presentation due to:  Chronicity.    Clinical Decision Making  Low    Rehab Potential  Good    PT Frequency  2x / week    PT Duration  8 weeks    PT Treatment/Interventions  ADLs/Self Care Home Management;Cryotherapy;Electrical Stimulation;Moist Heat;Ultrasound;Therapeutic activities;Therapeutic exercise;Patient/family education;Manual techniques;Dry needling    PT Next Visit Plan  Please perform combo e'stim/U/S to bilateral low back musculature; STW/M; dry needling; instruct in draw-in and prgress  with core exercise.      Consulted and Agree with Plan of Care  Patient       Patient will benefit from skilled therapeutic intervention in order to improve the following deficits and impairments:  Decreased activity tolerance, Decreased range of motion, Postural dysfunction, Pain  Visit Diagnosis: Chronic bilateral low back pain without sciatica - Plan: PT plan of care  cert/re-cert  Abnormal posture - Plan: PT plan of care cert/re-cert  G-Codes - 65/03/54 1121    Functional Assessment Tool Used (Outpatient Only)  FOTO....46% limitation.    Functional Limitation  Mobility: Walking and moving around    Mobility: Walking and Moving Around Current Status [phone])  At least 40 percent but less than 60 percent impaired, limited or restricted    Mobility: Walking and Moving Around Goal Status [phone])  At least 20 percent but less than 40 percent impaired, limited or restricted        Problem List Patient Active Problem List   Diagnosis Date Noted  . Fatty liver 01/15/2017  . Rash 06/17/2012  . AAA (abdominal aortic aneurysm) without rupture/ 3.8 cm 06/11/2012  . Acute respiratory failure with hypoxia ([place]) 06/11/2012  . Leukocytosis, unspecified 06/11/2012  . Pleuritic pain 06/11/2012  . Acute Pulmonary HTN (46 mmHg) 06/11/2012  . Saddle pulmonary embolus ([place]) 06/10/2012  . New onset atrial fibrillation ([place]) 06/10/2012  . Osteoarthritis 02/01/2011  . Hyperlipidemia 02/01/2011  . Hypertension 02/01/2011  . Chronic low back pain 02/01/2011    Zalika Tieszen, [place] MPT 04/05/2017, 11:30 AM  [facility] [address] [place], [place], [address] Phone: [phone]   Fax:  [phone]  Name: Jerry Fox MRN: [id number] Date of Birth: 1933-12-22

## 2017-04-09 ENCOUNTER — Encounter: Payer: Medicare Other | Admitting: Physical Therapy

## 2017-04-12 ENCOUNTER — Ambulatory Visit: Payer: Medicare Other | Admitting: Physical Therapy

## 2017-04-12 ENCOUNTER — Encounter: Payer: Self-pay | Admitting: Physical Therapy

## 2017-04-12 DIAGNOSIS — G8929 Other chronic pain: Secondary | ICD-10-CM

## 2017-04-12 DIAGNOSIS — R293 Abnormal posture: Secondary | ICD-10-CM

## 2017-04-12 DIAGNOSIS — M545 Low back pain: Principal | ICD-10-CM

## 2017-04-12 NOTE — Therapy (Signed)
[facility] [place], [place], [address] Phone: [phone]   Fax:  [phone]  Physical Therapy Treatment  Patient Details  Name: Jerry Fox MRN: [id number] Date of Birth: Aug 09, 1933 Referring Provider: Carolann Littler MD.   Encounter Date: 04/12/2017  PT End of Session - 04/12/17 1250    Visit Number  2    Number of Visits  16    Date for PT Re-Evaluation  06/04/17    PT Start Time  0945    PT Stop Time  3559    PT Time Calculation (min)  54 min       Past Medical History:  Diagnosis Date  . AAA (abdominal aortic aneurysm) ([place]) 2/14   3.8 cm 2/14  . Arthritis   . Atrial fibrillation ([place]) 2/14   acute pulmonary embolus  . Colon polyps   . Hyperlipidemia   . Hypertension   . Murmur, cardiac   . Saddle pulmonary embolus ([place]) 2/14   LLE DVT    Past Surgical History:  Procedure Laterality Date  . TONSILLECTOMY      There were no vitals filed for this visit.  Subjective Assessment - 04/12/17 1250    Subjective  No new complaints.    Pertinent History  OA.  Aortic aneurysm.    Patient Stated Goals  X-ray:  Mild degenerative changes of lumbar spine.    Pain Score  4     Pain Orientation  Right;Left;Mid    Pain Onset  More than a month ago                      [facility] Adult PT Treatment/Exercise - 04/12/17 0001      Modalities   Modalities  Electrical Stimulation;Moist Heat;Ultrasound      Moist Heat Therapy   Number Minutes Moist Heat  20 Minutes    Moist Heat Location  Lumbar Spine      Electrical Stimulation   Electrical Stimulation Location  -- Low back.    Electrical Stimulation Action  IFC    Electrical Stimulation Parameters  80-150 Hz x 20 minutes.    Electrical Stimulation Goals  Tone;Pain      Ultrasound   Ultrasound Location  -- Lumbar (bilateral).    Ultrasound Parameters  1.50 W/CM2 x 12 minutes (combo e'stim/U/S).    Ultrasound Goals  Pain      Manual  Therapy   Manual Therapy  Soft tissue mobilization    Manual therapy comments  STW/M and bilateral QL release technique x 11 minutes.               PT Short Term Goals - 04/05/17 1126      PT SHORT TERM GOAL #1   Title  STG's=LTG's.        PT Long Term Goals - 04/05/17 1126      PT LONG TERM GOAL #1   Title  Independent with a HEP.    Time  8    Period  Weeks    Status  New      PT LONG TERM GOAL #2   Title  Lumbar extension to 15 degrees actively.    Time  8    Period  Weeks    Status  New      PT LONG TERM GOAL #3   Title  Perform ADL's with pain not > 3-4/10.    Time  8    Period  Weeks  Status  New      PT LONG TERM GOAL #4   Title  Perform transitory movements with pain not > 3-4/10.    Time  8    Period  Weeks    Status  New            Plan - 04/12/17 1253    Clinical Impression Statement  Good response to treatment today.  Lumbar musculature very taut to palpation.       Patient will benefit from skilled therapeutic intervention in order to improve the following deficits and impairments:  Decreased activity tolerance, Decreased range of motion, Postural dysfunction, Pain  Visit Diagnosis: Chronic bilateral low back pain without sciatica  Abnormal posture     Problem List Patient Active Problem List   Diagnosis Date Noted  . Fatty liver 01/15/2017  . Rash 06/17/2012  . AAA (abdominal aortic aneurysm) without rupture/ 3.8 cm 06/11/2012  . Acute respiratory failure with hypoxia ([place]) 06/11/2012  . Leukocytosis, unspecified 06/11/2012  . Pleuritic pain 06/11/2012  . Acute Pulmonary HTN (46 mmHg) 06/11/2012  . Saddle pulmonary embolus ([place]) 06/10/2012  . New onset atrial fibrillation ([place]) 06/10/2012  . Osteoarthritis 02/01/2011  . Hyperlipidemia 02/01/2011  . Hypertension 02/01/2011  . Chronic low back pain 02/01/2011    Natoria Archibald, [place] MPT 04/12/2017, 12:54 PM  [facility] [address] [place], [place], [address] Phone: [phone]   Fax:  [phone]  Name: Jerry Fox MRN: [id number] Date of Birth: 05/18/1933

## 2017-04-15 ENCOUNTER — Ambulatory Visit: Payer: Medicare Other | Admitting: Physical Therapy

## 2017-04-15 ENCOUNTER — Telehealth: Payer: Self-pay | Admitting: *Deleted

## 2017-04-15 ENCOUNTER — Encounter: Payer: Self-pay | Admitting: Physical Therapy

## 2017-04-15 DIAGNOSIS — M545 Low back pain, unspecified: Secondary | ICD-10-CM

## 2017-04-15 DIAGNOSIS — R293 Abnormal posture: Secondary | ICD-10-CM

## 2017-04-15 DIAGNOSIS — G8929 Other chronic pain: Secondary | ICD-10-CM

## 2017-04-15 NOTE — Therapy (Addendum)
[facility] [place], [place], [address] Phone: [phone]   Fax:  [phone]  Physical Therapy Treatment  Patient Details  Name: Jerry Fox MRN: [id number] Date of Birth: 08/21/1933 Referring Provider: Carolann Littler MD.   Encounter Date: 04/15/2017  PT End of Session - 04/15/17 1104    Visit Number  3    Number of Visits  16    Date for PT Re-Evaluation  06/04/17    PT Start Time  1031    PT Stop Time  1114    PT Time Calculation (min)  43 min    Activity Tolerance  Patient tolerated treatment well    Behavior During Therapy  [facility] for tasks assessed/performed       Past Medical History:  Diagnosis Date  . AAA (abdominal aortic aneurysm) ([place]) 2/14   3.8 cm 2/14  . Arthritis   . Atrial fibrillation ([place]) 2/14   acute pulmonary embolus  . Colon polyps   . Hyperlipidemia   . Hypertension   . Murmur, cardiac   . Saddle pulmonary embolus ([place]) 2/14   LLE DVT    Past Surgical History:  Procedure Laterality Date  . TONSILLECTOMY      There were no vitals filed for this visit.  Subjective Assessment - 04/15/17 1032    Subjective  Patient reported doing well with no complaints    Pertinent History  OA.  Aortic aneurysm.    Patient Stated Goals  X-ray:  Mild degenerative changes of lumbar spine.    Currently in Pain?  Yes    Pain Score  3     Pain Location  Back    Pain Orientation  Right;Left;Lower    Pain Descriptors / Indicators  Discomfort    Pain Type  Chronic pain    Pain Onset  More than a month ago    Pain Frequency  Constant    Aggravating Factors   movement    Pain Relieving Factors  rest                      [facility] Adult PT Treatment/Exercise - 04/15/17 0001      Self-Care   Self-Care  ADL's;Lifting;Posture;Other Self-Care Comments    Other Self-Care Comments   HEP provided for all posture awareness techniques      Exercises   Exercises  Lumbar      Lumbar  Exercises: Supine   Ab Set  20 reps;3 seconds    Glut Set  20 reps;3 seconds    Bent Knee Raise  3 seconds;20 reps      Moist Heat Therapy   Number Minutes Moist Heat  15 Minutes    Moist Heat Location  Lumbar Spine      Electrical Stimulation   Electrical Stimulation Location  Low back.    Electrical Stimulation Action  IFC    Electrical Stimulation Parameters  80-_0  x93mn    Electrical Stimulation Goals  Tone;Pain      Manual Therapy   Manual Therapy  Soft tissue mobilization    Manual therapy comments  manual STW to bil low back paraspinals and QL area to reduce pain             PT Education - 04/15/17 1043    Education provided  Yes    Education Details  HEP posture awareness techniques and core activation    Person(s) Educated  Patient    Methods  Explanation;Demonstration;Handout  Comprehension  Verbalized understanding;Returned demonstration       PT Short Term Goals - 04/05/17 1126      PT SHORT TERM GOAL #1   Title  STG's=LTG's.        PT Long Term Goals - 04/15/17 1044      PT LONG TERM GOAL #1   Title  Independent with a HEP.    Time  8    Period  Weeks    Status  On-going      PT LONG TERM GOAL #2   Title  Lumbar extension to 15 degrees actively.    Time  8    Period  Weeks    Status  On-going      PT LONG TERM GOAL #3   Title  Perform ADL's with pain not > 3-4/10.    Time  8    Period  Weeks    Status  On-going      PT LONG TERM GOAL #4   Title  Perform transitory movements with pain not > 3-4/10.    Time  8    Period  Weeks    Status  On-going            Plan - 04/15/17 1044    Clinical Impression Statement  Patient tolerated treatment well today. Today educated patient on posture awareness techniques and core activation with HEP provided. Patient had palpable esp on left QL area. Patient has reported increased pain with certain movements and relief with rest. Current goals ongoing at this time due to apin deficts.      Rehab Potential  Good    PT Frequency  2x / week    PT Duration  8 weeks    PT Treatment/Interventions  ADLs/Self Care Home Management;Cryotherapy;Electrical Stimulation;Moist Heat;Ultrasound;Therapeutic activities;Therapeutic exercise;Patient/family education;Manual techniques;Dry needling    PT Next Visit Plan  cont with combo e'stim/U/S to bilateral low back musculature; STW/M; dry needling; instruct in draw-in and prgress with core exercise.      Consulted and Agree with Plan of Care  Patient       Patient will benefit from skilled therapeutic intervention in order to improve the following deficits and impairments:  Decreased activity tolerance, Decreased range of motion, Postural dysfunction, Pain  Visit Diagnosis: Chronic bilateral low back pain without sciatica  Abnormal posture     Problem List Patient Active Problem List   Diagnosis Date Noted  . Fatty liver 01/15/2017  . Rash 06/17/2012  . AAA (abdominal aortic aneurysm) without rupture/ 3.8 cm 06/11/2012  . Acute respiratory failure with hypoxia ([place]) 06/11/2012  . Leukocytosis, unspecified 06/11/2012  . Pleuritic pain 06/11/2012  . Acute Pulmonary HTN (46 mmHg) 06/11/2012  . Saddle pulmonary embolus ([place]) 06/10/2012  . New onset atrial fibrillation ([place]) 06/10/2012  . Osteoarthritis 02/01/2011  . Hyperlipidemia 02/01/2011  . Hypertension 02/01/2011  . Chronic low back pain 02/01/2011    Jerry Fox, PTA 04/15/2017, 11:16 AM  [facility] [place], [place], [address] Phone: [phone]   Fax:  [phone]  Name: Jerry Fox MRN: [id number] Date of Birth: Sep 03, 1933  PHYSICAL THERAPY DISCHARGE SUMMARY  Visits from Start of Care: 3.  Current functional level related to goals / functional outcomes: See above. Remaining deficits: See below.   Education / Equipment: HEP. Plan: Patient agrees to discharge.  Patient goals were not  met. Patient is being discharged due to not returning since the last visit.  ?????  Chad Applegate MPT  

## 2017-04-15 NOTE — Patient Instructions (Signed)
Pelvic Tilt: Posterior - Legs Bent (Supine)  Tighten stomach and flatten back by rolling pelvis down. Hold _10___ seconds. Relax. Repeat _10-30___ times per set. Do __2__ sets per session. Do _2___ sessions per day.   Bent Leg Lift (Hook-Lying)  Tighten stomach and slowly raise right leg _5___ inches from floor. Keep trunk rigid. Hold _3___ seconds. Repeat _10___ times per set. Do ___2-3_ sets per session. Do __2__ sessions per day.  Brushing Teeth    Place one foot on ledge and one hand on counter. Bend other knee slightly to keep back straight.  Copyright  VHI. All rights reserved.  Refrigerator   Squat with knees apart to reach lower shelves and drawers.   Copyright  VHI. All rights reserved.  Laundry Basket   Squat down and hold basket close to stand. Use leg muscles to do the work.   Copyright  VHI. All rights reserved.  Housework - Vacuuming   Hold the vacuum with arm held at side. Step back and forth to move it, keeping head up. Avoid twisting.   Copyright  VHI. All rights reserved.  Housework - Wiping   Position yourself as close as possible to reach work surface. Avoid straining your back.   Copyright  VHI. All rights reserved.  Gardening - Mowing   Keep arms close to sides and walk with lawn mower.   Copyright  VHI. All rights reserved.  Sleeping on Side   Place pillow between knees. Use cervical support under neck and a roll around waist as needed.   Copyright  VHI. All rights reserved.  Log Roll   Lying on back, bend left knee and place left arm across chest. Roll all in one movement to the right. Reverse to roll to the left. Always move as one unit.   Copyright  VHI. All rights reserved.  Stand to Sit / Sit to Stand   To sit: Bend knees to lower self onto front edge of chair, then scoot back on seat. To stand: Reverse sequence by placing one foot forward, and scoot to front of seat. Use rocking motion to stand  up.  Copyright  VHI. All rights reserved.  Posture - Standing   Good posture is important. Avoid slouching and forward head thrust. Maintain curve in low back and align ears over shoul- ders, hips over ankles.   Copyright  VHI. All rights reserved.  Posture - Sitting   Sit upright, head facing forward. Try using a roll to support lower back. Keep shoulders relaxed, and avoid rounded back. Keep hips level with knees. Avoid crossing legs for long periods.   Copyright  VHI. All rights reserved.  Computer Work   Position work to face forward. Use proper work and seat height. Keep shoulders back and down, wrists straight, and elbows at right angles. Use chair that provides full back support. Add footrest and lumbar roll as needed.   Copyright  VHI. All rights reserved.                

## 2017-04-15 NOTE — Telephone Encounter (Signed)
We've discussed many times.  If pain not improving with PT (which he is currently getting) I am happy to refer to chronic pain management.  I do not recommend chronic opioids at this time.

## 2017-04-15 NOTE — Telephone Encounter (Signed)
Copied from [place] [id number]. Topic: Quick Communication - Rx Refill/Question >> Apr 15, 2017  9:58 AM Marin Olp L wrote: Has the patient contacted their pharmacy? No. (Agent: If no, request that the patient contact the pharmacy for the refill.) Preferred Pharmacy (with phone number or street name): Pick up in office Agent: Please be advised that RX refills may take up to 3 business days. We ask that you follow-up with your pharmacy. Refill HYDROcodone-acetaminophen (NORCO) 10-325 MG tablet (wants to know if script can be mailed to him??)  Spoke with patient and he is aware of Dr [employer] message, but does not wish to go to pain management at this time.

## 2017-04-17 ENCOUNTER — Encounter: Payer: Medicare Other | Admitting: Physical Therapy

## 2017-06-05 ENCOUNTER — Telehealth: Payer: Self-pay | Admitting: Family Medicine

## 2017-06-05 ENCOUNTER — Ambulatory Visit: Payer: Self-pay | Admitting: *Deleted

## 2017-06-05 NOTE — Telephone Encounter (Signed)
Pt's wife called to report pt's arm has been numb. Would like nurse to speak with him. Called to triage.

## 2017-06-05 NOTE — Telephone Encounter (Signed)
Pt's wife called initially ( see telephone encounter) states pt has had left arm pain x 2 1/2 weeks and refuses to see MD. Hulen Skains pt for triage. Pt states no pain at present. States he lays on it at night and the muscle will be sore in the mornings at times. No pain presently. No other symptoms. No chest pain, SOB, nausea, dizziness. No one-sided weakness or speech difficulties. Home care advice given per protocol. Advised to keep arm elevated on pillow at HS. Instructed to call back if symptoms worsen. Reason for Disposition . Arm pain  Answer Assessment - Initial Assessment Questions 1. ONSET: "When did the pain start?"     2 1/2 weeks ago 2. LOCATION: "Where is the pain located?"     Left arm above elbow 3. PAIN: "How bad is the pain?" (Scale 1-10; or mild, moderate, severe)   - MILD (1-3): doesn't interfere with normal activities   - MODERATE (4-7): interferes with normal activities (e.g., work or school) or awakens from sleep   - SEVERE (8-10): excruciating pain, unable to do any normal activities, unable to hold a cup of water     Aches at time. Not present now 4. WORK OR EXERCISE: "Has there been any recent work or exercise that involved this part of the body?"     no 5. CAUSE: "What do you think is causing the arm pain?"     "I lie on it at night" 6. OTHER SYMPTOMS: "Do you have any other symptoms?" (e.g., neck pain, swelling, rash, fever, numbness, weakness)     no  Protocols used: ARM PAIN-A-AH

## 2017-06-12 ENCOUNTER — Other Ambulatory Visit: Payer: Self-pay | Admitting: Family Medicine

## 2017-06-25 ENCOUNTER — Telehealth: Payer: Self-pay | Admitting: *Deleted

## 2017-06-25 NOTE — Telephone Encounter (Signed)
Copied from [place]. Topic: Inquiry >> Jun 25, 2017  1:36 PM Vernona Rieger wrote: Reason for CRM:  Amy from [facility] called and said she sent over a form for a surgical clearance on Friday. She wants to know the status of that so she can let the pt know if he can or can not stop his blood thinner tomorrow. Call back [phone]. They will need it in writing and faxed back to [phone]

## 2017-06-26 ENCOUNTER — Encounter: Payer: Self-pay | Admitting: Family Medicine

## 2017-06-26 NOTE — Telephone Encounter (Signed)
Placed on Dr Burchette's desk 

## 2017-06-26 NOTE — Telephone Encounter (Signed)
Amy with Dr Kathlen Mody [facility] following up on surgical clearance form.  Pt has surgery next week, but needs to stop his blood thinner today. Advised amy to re fax, and she is sending now, with [place] today please   Phone:  [phone] ext [phone]

## 2017-06-26 NOTE — Telephone Encounter (Signed)
Letter faxed and confirmed

## 2017-06-26 NOTE — Telephone Encounter (Signed)
Letter done

## 2017-08-09 ENCOUNTER — Telehealth: Payer: Self-pay | Admitting: Family Medicine

## 2017-08-09 NOTE — Telephone Encounter (Signed)
Pt's wife calling in wanting to review pt's medicatoins with a nurse she thinks he has mixed his medications up and is wanting to make sure he is taking them correctly.

## 2017-08-09 NOTE — Telephone Encounter (Signed)
Copied from [place] [id number]. Topic: General - Other >> Aug 09, 2017 10:59 AM Darl Householder, RMA wrote: Reason for CRM: Patient is requesting a call back concerning medications furosemide (LASIX) 40 MG tablet and hydrochlorothiazide (HYDRODIURIL) 25 MG, please return pt call

## 2017-08-11 NOTE — Telephone Encounter (Signed)
He should be on Lasix 40 mg daily.  He is due for follow up labs anyway.  Set up follow up within next few weeks and have him bring in all medications at that time.

## 2017-08-12 NOTE — Telephone Encounter (Signed)
Appointment made

## 2017-08-12 NOTE — Telephone Encounter (Signed)
Spoke with wife and she agrees with an office visit.

## 2017-08-14 ENCOUNTER — Ambulatory Visit: Payer: Medicare Other | Admitting: Family Medicine

## 2017-08-14 ENCOUNTER — Encounter: Payer: Self-pay | Admitting: Family Medicine

## 2017-08-14 VITALS — BP 120/78 | HR 50 | Temp 97.6°F | Wt 221.3 lb

## 2017-08-14 DIAGNOSIS — M545 Low back pain, unspecified: Secondary | ICD-10-CM

## 2017-08-14 DIAGNOSIS — M47816 Spondylosis without myelopathy or radiculopathy, lumbar region: Secondary | ICD-10-CM

## 2017-08-14 DIAGNOSIS — R531 Weakness: Secondary | ICD-10-CM | POA: Diagnosis not present

## 2017-08-14 DIAGNOSIS — E785 Hyperlipidemia, unspecified: Secondary | ICD-10-CM | POA: Diagnosis not present

## 2017-08-14 DIAGNOSIS — I10 Essential (primary) hypertension: Secondary | ICD-10-CM

## 2017-08-14 DIAGNOSIS — G8929 Other chronic pain: Secondary | ICD-10-CM | POA: Diagnosis not present

## 2017-08-14 LAB — CBC WITH DIFFERENTIAL/PLATELET
BASOS ABS: 0 10*3/uL (ref 0.0–0.1)
BASOS PCT: 0.4 % (ref 0.0–3.0)
EOS ABS: 0.1 10*3/uL (ref 0.0–0.7)
Eosinophils Relative: 1.1 % (ref 0.0–5.0)
HCT: 39.5 % (ref 39.0–52.0)
HEMOGLOBIN: 13.6 g/dL (ref 13.0–17.0)
Lymphocytes Relative: 26.4 % (ref 12.0–46.0)
Lymphs Abs: 1.9 10*3/uL (ref 0.7–4.0)
MCHC: 34.4 g/dL (ref 30.0–36.0)
MCV: 95.4 fl (ref 78.0–100.0)
Monocytes Absolute: 0.6 10*3/uL (ref 0.1–1.0)
Monocytes Relative: 8.4 % (ref 3.0–12.0)
Neutro Abs: 4.7 10*3/uL (ref 1.4–7.7)
Neutrophils Relative %: 63.7 % (ref 43.0–77.0)
Platelets: 263 10*3/uL (ref 150.0–400.0)
RBC: 4.14 Mil/uL — ABNORMAL LOW (ref 4.22–5.81)
RDW: 12.7 % (ref 11.5–15.5)
WBC: 7.3 10*3/uL (ref 4.0–10.5)

## 2017-08-14 LAB — BASIC METABOLIC PANEL
BUN: 16 mg/dL (ref 6–23)
CHLORIDE: 102 meq/L (ref 96–112)
CO2: 31 mEq/L (ref 19–32)
CREATININE: 0.92 mg/dL (ref 0.40–1.50)
Calcium: 9.2 mg/dL (ref 8.4–10.5)
GFR: 83.78 mL/min (ref >60.00)
Glucose, Bld: 100 mg/dL — ABNORMAL HIGH (ref 70–99)
POTASSIUM: 5.1 meq/L (ref 3.5–5.1)
Sodium: 139 mEq/L (ref 135–145)

## 2017-08-14 LAB — HEPATIC FUNCTION PANEL
ALK PHOS: 43 U/L (ref 39–117)
ALT: 12 U/L (ref 0–53)
AST: 13 U/L (ref 0–37)
Albumin: 3.9 g/dL (ref 3.5–5.2)
BILIRUBIN DIRECT: 0.1 mg/dL (ref 0.0–0.3)
TOTAL PROTEIN: 6.6 g/dL (ref 6.0–8.3)
Total Bilirubin: 0.6 mg/dL (ref 0.2–1.2)

## 2017-08-14 LAB — LIPID PANEL
CHOL/HDL RATIO: 4
Cholesterol: 162 mg/dL (ref 0–200)
HDL: 38.4 mg/dL — ABNORMAL LOW (ref >39.00)
LDL CALC: 102 mg/dL — AB (ref 0–99)
NonHDL: 123.65
Triglycerides: 108 mg/dL (ref 0.0–149.0)
VLDL: 21.6 mg/dL (ref 0.0–40.0)

## 2017-08-14 LAB — MAGNESIUM: MAGNESIUM: 2.3 mg/dL (ref 1.5–2.5)

## 2017-08-14 LAB — TSH: TSH: 0.59 u[IU]/mL (ref 0.35–4.50)

## 2017-08-14 MED ORDER — HYDROCODONE-ACETAMINOPHEN 10-325 MG PO TABS: 1.0000 | ORAL_TABLET | Freq: Four times a day (QID) | ORAL | 0 refills | Status: DC | PRN

## 2017-08-14 NOTE — Progress Notes (Signed)
Subjective:     Patient ID: Jerry Fox, male   DOB: [birth year], 84 y.o.   MRN: [id number]  HPI Patient here with wife to discuss medications. We received phone call couple days ago that had questions about medications. He apparently had some generalized weakness several days ago (patient denies)- but wife states he was weaker/more fatigued than usual.  They're very confused about medications. He had both HCTZ and Lasix on list.  Apparently, his wife stopped several his medications without advice few days ago including lovastatin, amlodipine, Cymbalta, and HCTZ. They think he is doing some better today. He denies any chest pains or dyspnea. No fevers or chills. No dysuria. No, pain. No headaches. No acute confusion.  He apparently has been taking metoprolol, furosemide, lisinopril, potassium, and Xarelto  Chronic problems include history of atrial fibrillation, hypertension, pulmonary hypertension, history of pulmonary emboli, fatty liver changes, chronic low back pain, hyperlipidemia  Past Medical History:  Diagnosis Date  . AAA (abdominal aortic aneurysm) ([place]) 2/14   3.8 cm 2/14  . Arthritis   . Atrial fibrillation ([place]) 2/14   acute pulmonary embolus  . Colon polyps   . Hyperlipidemia   . Hypertension   . Murmur, cardiac   . Saddle pulmonary embolus ([place]) 2/14   LLE DVT   Past Surgical History:  Procedure Laterality Date  . TONSILLECTOMY      reports that he quit smoking about 63 years ago. His smoking use included cigarettes. He has a 10.00 pack-year smoking history. He quit smokeless tobacco use about 53 years ago. His smokeless tobacco use included chew. He reports that he does not drink alcohol. His drug history is not on file. family history includes Arthritis in his father and mother; Heart disease in his father, mother, and paternal grandfather; Hypertension in his father. Allergies  Allergen Reactions  . Diltiazem Rash       Review of Systems  Constitutional:  Positive for fatigue. Negative for chills, fever and unexpected weight change.  Respiratory: Negative for shortness of breath and wheezing.   Cardiovascular: Negative for chest pain and palpitations.  Gastrointestinal: Negative for abdominal pain, diarrhea, nausea and vomiting.  Genitourinary: Negative for dysuria.  Neurological: Negative for dizziness and headaches.  Hematological: Negative for adenopathy.  Psychiatric/Behavioral: Negative for confusion.       Objective:   Physical Exam  Constitutional: He is oriented to person, place, and time. He appears well-developed and well-nourished.  Neck: Neck supple. No thyromegaly present.  Cardiovascular: Normal rate.  Pulmonary/Chest: Effort normal and breath sounds normal. No respiratory distress. He has no wheezes. He has no rales.  Musculoskeletal:  Trace nonpitting edema lower legs bilaterally  Neurological: He is alert and oriented to person, place, and time. No cranial nerve deficit.       Assessment:     #1 reports of recent increased generalized weakness-improved today after holding several medications  #2 hypertension stable and at goal  #3 history of pulmonary embolus and atrial fibrillation on chronic anticoagulation  #4 polypharmacy  #5 hyperlipidemia    Plan:     -We recommend they continue to hold HCTZ and amlodipine as blood pressure is very stable today off these medications. -Check further labs with TSH, lipid panel, hepatic panel, CBC, basic metabolic panel, magnesium level -Limited hydrocodone as needed for severe back pain. He takes regular Tylenol most days but is infrequently taking hydrocodone for breakthrough pain  [name] MD [facility]

## 2017-08-15 ENCOUNTER — Telehealth: Payer: Self-pay | Admitting: Family Medicine

## 2017-08-15 NOTE — Telephone Encounter (Signed)
Copied from [place] [id number]. Topic: Quick Communication - See Telephone Encounter >> Aug 15, 2017  5:01 PM Ivar Drape wrote: CRM for notification. See Telephone encounter for: 08/15/17. Patient's blood pressure is up 160/83 since taking new meds.  Please advise.

## 2017-08-19 ENCOUNTER — Encounter: Payer: Medicare Other | Admitting: Family Medicine

## 2017-08-19 DIAGNOSIS — Z0289 Encounter for other administrative examinations: Secondary | ICD-10-CM

## 2017-08-19 NOTE — Telephone Encounter (Signed)
Spoke with pt. He advised an understanding. Due to pt wife being out of town follow up was made for 08/10/17.

## 2017-08-19 NOTE — Telephone Encounter (Signed)
Start back the Amlodipine (which they recently held) and office follow up within  2 weeks to reassess.

## 2017-09-09 ENCOUNTER — Ambulatory Visit: Payer: Medicare Other | Admitting: Family Medicine

## 2017-09-11 ENCOUNTER — Ambulatory Visit: Payer: Medicare Other | Admitting: Family Medicine

## 2017-09-19 ENCOUNTER — Telehealth: Payer: Self-pay | Admitting: Family Medicine

## 2017-09-19 NOTE — Telephone Encounter (Signed)
Copied from [place] [id number]. Topic: Quick Communication - Rx Refill/Question >> Sep 19, 2017 11:19 AM Marin Olp L wrote: Medication: amlodipine? (what is current blood pressure regime?)  Has the patient contacted their pharmacy? Yes.   (Agent: If no, request that the patient contact the pharmacy for the refill.) (Agent: If yes, when and what did the pharmacy advise?)  Preferred Pharmacy (with phone number or street name): [place], [address] [place] [address] Phone: [phone] Fax: [phone]  Agent: Please be advised that RX refills may take up to 3 business days. We ask that you follow-up with your pharmacy.  Please call pharmacy to clarify whether patient should take amlodipine, and if so, why, and what dose?

## 2017-09-20 ENCOUNTER — Ambulatory Visit: Payer: Medicare Other | Admitting: Family Medicine

## 2017-09-20 ENCOUNTER — Encounter: Payer: Self-pay | Admitting: Family Medicine

## 2017-09-20 VITALS — BP 110/70 | HR 54 | Temp 97.5°F | Wt 220.6 lb

## 2017-09-20 DIAGNOSIS — R600 Localized edema: Secondary | ICD-10-CM | POA: Diagnosis not present

## 2017-09-20 DIAGNOSIS — I10 Essential (primary) hypertension: Secondary | ICD-10-CM

## 2017-09-20 DIAGNOSIS — I2692 Saddle embolus of pulmonary artery without acute cor pulmonale: Secondary | ICD-10-CM

## 2017-09-20 DIAGNOSIS — M545 Low back pain, unspecified: Secondary | ICD-10-CM

## 2017-09-20 DIAGNOSIS — G8929 Other chronic pain: Secondary | ICD-10-CM

## 2017-09-20 MED ORDER — HYDROCODONE-ACETAMINOPHEN 10-325 MG PO TABS: 1.0000 | ORAL_TABLET | Freq: Four times a day (QID) | ORAL | 0 refills | Status: DC | PRN

## 2017-09-20 MED ORDER — AMLODIPINE BESYLATE 2.5 MG PO TABS: 2.5000 mg | ORAL_TABLET | Freq: Every day | ORAL | 3 refills | Status: DC

## 2017-09-20 MED ORDER — TRAZODONE HCL 50 MG PO TABS: 50.0000 mg | ORAL_TABLET | Freq: Every day | ORAL | 3 refills | Status: DC

## 2017-09-20 NOTE — Telephone Encounter (Signed)
Patient discussed with Dr Elease Hashimoto at office visit

## 2017-09-20 NOTE — Progress Notes (Signed)
Subjective:     Patient ID: Jerry Fox, male   DOB: Nov 21, 1933, 84 y.o.   MRN: [id number]  HPI Patient has chronic problems including hypertension, chronic back pain, hyperlipidemia, history of pulmonary emboli, history of atrial fibrillation. He is on Xarelto regularly.  Had presented here month ago of some generalized weakness. We obtained multiple labs which were unremarkable. We held his HCTZ and amlodipine and apparently since that visit wife started back amlodipine 5 mg daily. Blood pressure been very stable home usually around 403 systolic.  He does have some mild lower extremity edema which is not new. Worse late in the day. Very sedentary. Has refused compression garments in past. No orthopnea.  Requesting refills of pain medication for his low back pain which has been chronic. We have encouraged him to avoid regular use of hydrocodone if possible.  Takes for severe pain.  Past Medical History:  Diagnosis Date  . AAA (abdominal aortic aneurysm) ([place]) 2/14   3.8 cm 2/14  . Arthritis   . Atrial fibrillation ([place]) 2/14   acute pulmonary embolus  . Colon polyps   . Hyperlipidemia   . Hypertension   . Murmur, cardiac   . Saddle pulmonary embolus ([place]) 2/14   LLE DVT   Past Surgical History:  Procedure Laterality Date  . TONSILLECTOMY      reports that he quit smoking about 63 years ago. His smoking use included cigarettes. He has a 10.00 pack-year smoking history. He quit smokeless tobacco use about 53 years ago. His smokeless tobacco use included chew. He reports that he does not drink alcohol. His drug history is not on file. family history includes Arthritis in his father and mother; Heart disease in his father, mother, and paternal grandfather; Hypertension in his father. Allergies  Allergen Reactions  . Diltiazem Rash     Review of Systems  Constitutional: Negative for fatigue and unexpected weight change.  Eyes: Negative for visual disturbance.  Respiratory:  Negative for cough, chest tightness and shortness of breath.   Cardiovascular: Positive for leg swelling. Negative for chest pain and palpitations.  Gastrointestinal: Negative for abdominal pain.  Neurological: Negative for dizziness, syncope, weakness, light-headedness and headaches.       Objective:   Physical Exam  Constitutional: He is oriented to person, place, and time. He appears well-developed and well-nourished.  HENT:  Right Ear: External ear normal.  Left Ear: External ear normal.  Mouth/Throat: Oropharynx is clear and moist.  Eyes: Pupils are equal, round, and reactive to light.  Neck: Neck supple. No thyromegaly present.  Cardiovascular: Normal rate and regular rhythm.  Pulmonary/Chest: Effort normal and breath sounds normal. No respiratory distress. He has no wheezes. He has no rales.  Musculoskeletal: He exhibits edema.  Patient has trace pitting edema lower legs bilaterally  Neurological: He is alert and oriented to person, place, and time.       Assessment:     #1 hypertension stable and at goal  #2 bilateral leg edema suspect largely related to venous stasis  #3 history of pulmonary emboli on chronic anticoagulation with Xarelto  #4 chronic low back pain      Plan:     -Continue current medications -Reduce amlodipine to 2.5 mg daily. He did not have amlodipine on his list but they have still been giving him 5 mg daily. Since his blood pressure is so well controlled today try to reduce down 2.5 mg which will hopefully help with his edema issues. -Also recommend frequent  elevation of legs and consider compression garments although he has declined in the past -1 refill pain medication given -Wife mention on leaving that she thinks he had some cognitive issues recently. Consider follow-up Mini-Mental status exam at three-month follow-up  [name] MD [facility] at [facility]

## 2017-09-20 NOTE — Patient Instructions (Signed)

## 2017-09-20 NOTE — Telephone Encounter (Signed)
We need to confirm with pharmacy if he has been taking this.  I'm not sure why this is not on his list.

## 2017-12-03 ENCOUNTER — Other Ambulatory Visit: Payer: Self-pay | Admitting: Family Medicine

## 2018-01-07 ENCOUNTER — Other Ambulatory Visit: Payer: Self-pay | Admitting: Family Medicine

## 2018-01-07 NOTE — Telephone Encounter (Signed)
xarelto refill OK.  He is not on hydrocodone regularly.  Would not refill the hydrocodone.

## 2018-01-07 NOTE — Telephone Encounter (Signed)
Last refill given on 5/24 for #20 with no ref

## 2018-01-09 NOTE — Telephone Encounter (Signed)
Rx sent for Xarelto and denial for Hydrocodone.

## 2018-05-05 ENCOUNTER — Ambulatory Visit: Payer: Medicare Other | Admitting: Family Medicine

## 2018-05-05 ENCOUNTER — Encounter: Payer: Self-pay | Admitting: Family Medicine

## 2018-05-05 ENCOUNTER — Other Ambulatory Visit: Payer: Self-pay

## 2018-05-05 VITALS — BP 110/68 | HR 56 | Temp 97.7°F | Wt 229.4 lb

## 2018-05-05 DIAGNOSIS — R05 Cough: Secondary | ICD-10-CM | POA: Diagnosis not present

## 2018-05-05 DIAGNOSIS — R600 Localized edema: Secondary | ICD-10-CM

## 2018-05-05 DIAGNOSIS — R062 Wheezing: Secondary | ICD-10-CM

## 2018-05-05 DIAGNOSIS — R059 Cough, unspecified: Secondary | ICD-10-CM

## 2018-05-05 MED ORDER — AZITHROMYCIN 250 MG PO TABS: ORAL_TABLET | ORAL | 0 refills | Status: AC

## 2018-05-05 MED ORDER — METHYLPREDNISOLONE ACETATE 80 MG/ML IJ SUSP
80.0000 mg | Freq: Once | INTRAMUSCULAR | Status: AC
  Administered 2018-05-05: 80 mg via INTRAMUSCULAR

## 2018-05-05 MED ORDER — ALBUTEROL SULFATE HFA 108 (90 BASE) MCG/ACT IN AERS
2.0000 | INHALATION_SPRAY | RESPIRATORY_TRACT | 0 refills | Status: DC | PRN
Start: 2018-05-05 — End: 2021-04-28

## 2018-05-05 NOTE — Patient Instructions (Signed)
Elevate legs frequently  Increase the Furosemide 40 mg to one twice daily for 5 days and then go back to one daily.  [facility]  Follow up for any fever or increased shortness of breath.

## 2018-05-05 NOTE — Progress Notes (Signed)
  Subjective:     Patient ID: Jerry Fox, male   DOB: 1933/11/10, 85 y.o.   MRN: [id number]  HPI Patient is seen with about a 7 to 10-day history of wheezing and cough.  No fever.  Wife had similar illness earlier.  He has taken Robitussin-DM and Tessalon without much improvement.  He is followed by the [facility] and has had some recent increased bilateral leg edema.  Currently on furosemide 40 mg daily.  Denies any orthopnea.  No known history of systolic failure.  He has some nasal congestion as well.  No facial pain.  No sore throat.  No history of asthma.  Non-smoker.  Past Medical History:  Diagnosis Date  . AAA (abdominal aortic aneurysm) ([place]) 2/14   3.8 cm 2/14  . Arthritis   . Atrial fibrillation ([place]) 2/14   acute pulmonary embolus  . Colon polyps   . Hyperlipidemia   . Hypertension   . Murmur, cardiac   . Saddle pulmonary embolus ([place]) 2/14   LLE DVT   Past Surgical History:  Procedure Laterality Date  . TONSILLECTOMY      reports that he quit smoking about 64 years ago. His smoking use included cigarettes. He has a 10.00 pack-year smoking history. He quit smokeless tobacco use about 54 years ago.  His smokeless tobacco use included chew. He reports that he does not drink alcohol. No history on file for drug. family history includes Arthritis in his father and mother; Heart disease in his father, mother, and paternal grandfather; Hypertension in his father. Allergies  Allergen Reactions  . Diltiazem Rash     Review of Systems  Constitutional: Negative for chills and fever.  HENT: Negative for sore throat.   Respiratory: Positive for cough and wheezing.   Cardiovascular: Positive for leg swelling. Negative for chest pain and palpitations.  Neurological: Negative for syncope.       Objective:   Physical Exam Constitutional:      Appearance: Normal appearance.  Neck:     Musculoskeletal: Neck supple.  Cardiovascular:     Rate and Rhythm: Normal rate.   Pulmonary:     Comments: He has some diffuse expiratory wheezes.  No retractions.  Pulse oximetry 98%.  No rales. Musculoskeletal:     Comments: He does have 1+ pitting edema lower legs feet and ankles bilaterally  Neurological:     Mental Status: He is alert.        Assessment:     #1 patient seen with approximately 10-day history of cough and does have some wheezing on exam but no respiratory distress.  ?viral trigger.    #2 increasing bilateral lower extremity edema which is [facility]    Plan:     -Depo-Medrol 80 mg IM given -Prescription for albuterol MDI 2 puffs every 4 hours as needed for cough and wheeze -Zithromax for 5 days -He is cautioned about taking Robitussin or any other medications with dextromethorphan with his Cymbalta -Increase Lasix to 40 mg twice daily for the next 5 days and recommend close follow-up with [facility].  Also recommend elevate legs frequently  [name] MD [facility] at [facility]

## 2018-05-21 ENCOUNTER — Telehealth: Payer: Self-pay

## 2018-05-21 NOTE — Telephone Encounter (Signed)
Author phoned pt. To set up subsequent AWV. Line busy. Will re-attempt contact at another time.

## 2018-05-23 NOTE — Telephone Encounter (Signed)
Author phoned pt. X2 to set up AWV. Author left VM asking for return call if interested in setting up.

## 2018-08-11 ENCOUNTER — Telehealth: Payer: Self-pay | Admitting: *Deleted

## 2018-08-11 NOTE — Telephone Encounter (Signed)
Called to schedule awv no answer.

## 2018-11-08 ENCOUNTER — Other Ambulatory Visit: Payer: Self-pay | Admitting: Family Medicine

## 2018-11-10 NOTE — Telephone Encounter (Signed)
He has been getting all his care through the [place]

## 2018-11-10 NOTE — Telephone Encounter (Signed)
Are you filling this or his [place] doctor?

## 2018-11-11 ENCOUNTER — Telehealth: Payer: Self-pay | Admitting: Family Medicine

## 2018-11-11 NOTE — Telephone Encounter (Signed)
Pt get his meds through [place] but they are late getting it to patient. Jerry Fox is calling and needing a temporary prescription for lisinopril mg.   Sent to [facility]

## 2018-11-12 ENCOUNTER — Other Ambulatory Visit: Payer: Self-pay

## 2018-11-12 ENCOUNTER — Ambulatory Visit (INDEPENDENT_AMBULATORY_CARE_PROVIDER_SITE_OTHER): Payer: Medicare Other | Admitting: Family Medicine

## 2018-11-12 DIAGNOSIS — I10 Essential (primary) hypertension: Secondary | ICD-10-CM | POA: Diagnosis not present

## 2018-11-12 MED ORDER — LISINOPRIL 20 MG PO TABS: 20.0000 mg | ORAL_TABLET | Freq: Every day | ORAL | 0 refills | Status: DC

## 2018-11-12 NOTE — Progress Notes (Signed)
Patient ID: Jerry Fox, male   DOB: 07/19/1933, 85 y.o.   MRN: [id number]  This visit type was conducted due to national recommendations for restrictions regarding the COVID-19 pandemic in an effort to limit this patient's exposure and mitigate transmission in our community.   Virtual Visit via Telephone Note  I connected with Jerry Fox on 11/12/18 at  2:15 PM EDT by telephone and verified that I am speaking with the correct person using two identifiers.   I discussed the limitations, risks, security and privacy concerns of performing an evaluation and management service by telephone and the availability of in person appointments. I also discussed with the patient that there may be a patient responsible charge related to this service. The patient expressed understanding and agreed to proceed.  Location patient: home Location provider: work or home office Participants present for the call: patient, provider Patient did not have a visit in the prior 7 days to address this/these issue(s).   History of Present Illness: Patient has chronic problems including hypertension, history of A. fib, history of pulmonary embolus, history of fatty liver changes, hyperlipidemia.  He has been followed regularly by the [facility] for the past couple years.  They called this morning requesting refill of lisinopril and we were confused for the request.  Apparently they were having difficulty getting through to the [place].  In the meantime, his wife found his prescription and so they do not need refills at this time.  Patient states he is doing well.  No recent falls.  He is getting lab work through the [place] reportedly in a week or so.  No recent chest pains.  No dyspnea.  We have asked that they have labs from the [place] forwarded here and also to bring in a current copy of his med list from the [place] to make sure this reconciles with ours   Observations/Objective: Patient sounds cheerful and well on the phone. I do not  appreciate any SOB. Speech and thought processing are grossly intact. Patient reported vitals:  Assessment and Plan:  Hypertension.  Well controlled in the past.  -Patient does not need any refills at this time. -Forward labs from the [place] to [place] for further review  Follow Up Instructions:  -He will follow-up here as needed   [id number] [id number] [id number] 11-20 [id number] 21-30 I did not refer this patient for an OV in the next 24 hours for this/these issue(s).  I discussed the assessment and treatment plan with the patient. The patient was provided an opportunity to ask questions and all were answered. The patient agreed with the plan and demonstrated an understanding of the instructions.   The patient was advised to call back or seek an in-person evaluation if the symptoms worsen or if the condition fails to improve as anticipated.  I provided 12 minutes of non-face-to-face time during this encounter.   Carolann Littler, MD

## 2018-11-12 NOTE — Telephone Encounter (Signed)
OK to send.  Can set up Doxy.;

## 2018-11-12 NOTE — Telephone Encounter (Signed)
This patient really needs an appointment.  Please advise if OK to fill a 30 day Rx of lisinopril or if needs an appointment. Not sure if he mostly goes to [place]? Last filled in 2018

## 2018-11-12 NOTE — Telephone Encounter (Signed)
Called patient and spoke to his wife and she stated that she found some of the medication in a drawer. Wife has made a telephone appointment today with Dr. Elease Hashimoto to update the changes in her husband as he has had several mini strokes and memory loss.

## 2019-04-14 ENCOUNTER — Ambulatory Visit (INDEPENDENT_AMBULATORY_CARE_PROVIDER_SITE_OTHER): Payer: Medicare Other

## 2019-04-14 VITALS — Ht 69.0 in | Wt 229.0 lb

## 2019-04-14 DIAGNOSIS — Z0000 Encounter for general adult medical examination without abnormal findings: Secondary | ICD-10-CM | POA: Diagnosis not present

## 2019-04-14 NOTE — Patient Instructions (Addendum)
Jerry Fox , Thank you for taking time to participate in your Medicare Wellness Visit. I appreciate your ongoing commitment to your health goals. Please review the following plan we discussed and let me know if I can assist you in the future.   Screening recommendations/referrals: Colorectal Screening: no longer necessary due to age  85 and Dental Exams: Recommended annual ophthalmology exams for early detection of glaucoma and other disorders of the eye Recommended annual dental exams for proper oral hygiene  Diabetic Exams: Diabetic Eye Exam: N/A Diabetic Foot Exam: N/A  Vaccinations: Influenza vaccine: completed 01/15/2019; due again Fall 2021 Pneumococcal vaccine: completed 11/28/2015 & 11/25/2014 Tdap vaccine: completed 11/25/2014; due 11/24/2024 Shingles vaccine: completed 10/07/2017 & 12/14/2017  Advanced directives: Advance directives discussed with you today. I have provided a copy for you to complete at home and have notarized. Once this is complete please bring a copy in to our office so we can scan it into your chart.  Goals: Recommend to drink at least 6-8 8oz glasses of water per day.   Recommend to remove any items from the home that may cause slips or trips.  Recommend to decrease portion sizes by eating 3 small healthy meals and at least 2 healthy snacks per day.   Recommend to follow a low salt diet as directed below    Next appointment: Please schedule your Annual Wellness Visit with your Nurse Health Advisor in one year.  Preventive Care 30 Years and Older, Male Preventive care refers to lifestyle choices and visits with your health care provider that can promote health and wellness. What does preventive care include?  A yearly physical exam. This is also called an annual well check.  Dental exams once or twice a year.  Routine eye exams. Ask your health care provider how often you should have your eyes checked.  Personal lifestyle choices,  including:  Daily care of your teeth and gums.  Regular physical activity.  Eating a healthy diet.  Avoiding tobacco and drug use.  Limiting alcohol use.  Practicing safe sex.  Taking low doses of aspirin every day if recommended by your health care provider..  Taking vitamin and mineral supplements as recommended by your health care provider. What happens during an annual well check? The services and screenings done by your health care provider during your annual well check will depend on your age, overall health, lifestyle risk factors, and family history of disease. Counseling  Your health care provider may ask you questions about your:  Alcohol use.  Tobacco use.  Drug use.  Emotional well-being.  Home and relationship well-being.  Sexual activity.  Eating habits.  History of falls.  Memory and ability to understand (cognition).  Work and work Statistician. Screening  You may have the following tests or measurements:  Height, weight, and BMI.  Blood pressure.  Lipid and cholesterol levels. These may be checked every 5 years, or more frequently if you are over 97 years old.  Skin check.  Lung cancer screening. You may have this screening every year starting at age 31 if you have a 30-pack-year history of smoking and currently smoke or have quit within the past 15 years.  Fecal occult blood test (FOBT) of the stool. You may have this test every year starting at age 63.  Flexible sigmoidoscopy or colonoscopy. You may have a sigmoidoscopy every 5 years or a colonoscopy every 10 years starting at age 30.  Prostate cancer screening. Recommendations will vary depending on your family  history and other risks.  Hepatitis C blood test.  Hepatitis B blood test.  Sexually transmitted disease (STD) testing.  Diabetes screening. This is done by checking your blood sugar (glucose) after you have not eaten for a while (fasting). You may have this done every 1-3  years.  Abdominal aortic aneurysm (AAA) screening. You may need this if you are a current or former smoker.  Osteoporosis. You may be screened starting at age 28 if you are at high risk. Talk with your health care provider about your test results, treatment options, and if necessary, the need for more tests. Vaccines  Your health care provider may recommend certain vaccines, such as:  Influenza vaccine. This is recommended every year.  Tetanus, diphtheria, and acellular pertussis (Tdap, Td) vaccine. You may need a Td booster every 10 years.  Zoster vaccine. You may need this after age 66.  Pneumococcal 13-valent conjugate (PCV13) vaccine. One dose is recommended after age 22.  Pneumococcal polysaccharide (PPSV23) vaccine. One dose is recommended after age 96. Talk to your health care provider about which screenings and vaccines you need and how often you need them. This information is not intended to replace advice given to you by your health care provider. Make sure you discuss any questions you have with your health care provider. Document Released: 05/13/2015 Document Revised: 01/04/2016 Document Reviewed: 02/15/2015 Elsevier Interactive Patient Education  2017 [place] Prevention in the Home Falls can cause injuries. They can happen to people of all ages. There are many things you can do to make your home safe and to help prevent falls. What can I do on the outside of my home?  Regularly fix the edges of walkways and driveways and fix any cracks.  Remove anything that might make you trip as you walk through a door, such as a raised step or threshold.  Trim any bushes or trees on the path to your home.  Use bright outdoor lighting.  Clear any walking paths of anything that might make someone trip, such as rocks or tools.  Regularly check to see if handrails are loose or broken. Make sure that both sides of any steps have handrails.  Any raised decks and porches  should have guardrails on the edges.  Have any leaves, snow, or ice cleared regularly.  Use sand or salt on walking paths during winter.  Clean up any spills in your garage right away. This includes oil or grease spills. What can I do in the bathroom?  Use night lights.  Install grab bars by the toilet and in the tub and shower. Do not use towel bars as grab bars.  Use non-skid mats or decals in the tub or shower.  If you need to sit down in the shower, use a plastic, non-slip stool.  Keep the floor dry. Clean up any water that spills on the floor as soon as it happens.  Remove soap buildup in the tub or shower regularly.  Attach bath mats securely with double-sided non-slip rug tape.  Do not have throw rugs and other things on the floor that can make you trip. What can I do in the bedroom?  Use night lights.  Make sure that you have a light by your bed that is easy to reach.  Do not use any sheets or blankets that are too big for your bed. They should not hang down onto the floor.  Have a firm chair that has side arms. You can use  this for support while you get dressed.  Do not have throw rugs and other things on the floor that can make you trip. What can I do in the kitchen?  Clean up any spills right away.  Avoid walking on wet floors.  Keep items that you use a lot in easy-to-reach places.  If you need to reach something above you, use a strong step stool that has a grab bar.  Keep electrical cords out of the way.  Do not use floor polish or wax that makes floors slippery. If you must use wax, use non-skid floor wax.  Do not have throw rugs and other things on the floor that can make you trip. What can I do with my stairs?  Do not leave any items on the stairs.  Make sure that there are handrails on both sides of the stairs and use them. Fix handrails that are broken or loose. Make sure that handrails are as long as the stairways.  Check any carpeting to  make sure that it is firmly attached to the stairs. Fix any carpet that is loose or worn.  Avoid having throw rugs at the top or bottom of the stairs. If you do have throw rugs, attach them to the floor with carpet tape.  Make sure that you have a light switch at the top of the stairs and the bottom of the stairs. If you do not have them, ask someone to add them for you. What else can I do to help prevent falls?  Wear shoes that:  Do not have high heels.  Have rubber bottoms.  Are comfortable and fit you well.  Are closed at the toe. Do not wear sandals.  If you use a stepladder:  Make sure that it is fully opened. Do not climb a closed stepladder.  Make sure that both sides of the stepladder are locked into place.  Ask someone to hold it for you, if possible.  Clearly mark and make sure that you can see:  Any grab bars or handrails.  First and last steps.  Where the edge of each step is.  Use tools that help you move around (mobility aids) if they are needed. These include:  Canes.  Walkers.  Scooters.  Crutches.  Turn on the lights when you go into a dark area. Replace any light bulbs as soon as they burn out.  Set up your furniture so you have a clear path. Avoid moving your furniture around.  If any of your floors are uneven, fix them.  If there are any pets around you, be aware of where they are.  Review your medicines with your doctor. Some medicines can make you feel dizzy. This can increase your chance of falling. Ask your doctor what other things that you can do to help prevent falls. This information is not intended to replace advice given to you by your health care provider. Make sure you discuss any questions you have with your health care provider. Document Released: 02/10/2009 Document Revised: 09/22/2015 Document Reviewed: 05/21/2014 Elsevier Interactive Patient Education  2017 [employer].

## 2019-04-14 NOTE — Progress Notes (Signed)
This visit is being conducted via phone call due to the COVID-19 pandemic. This patient has given me verbal consent via phone to conduct this visit, patient states they are participating from their home address. Some vital signs may be absent or patient reported.   Patient identification: identified by name, DOB, and current address.  Location provider: [facility], Office Persons participating in the virtual visit:  Mr. Brach Pitzen, Mrs. Aisaiah Lehouillier, and Franne Forts, LPN.    Subjective:   Jerry Fox is a 85 y.o. male who presents for Medicare Annual/Subsequent preventive examination.  Review of Systems:   Cardiac Risk Factors include: advanced age (>66men, >64 women);male gender;hypertension;obesity (BMI >30kg/m2);sedentary lifestyle;dyslipidemia     Objective:    Vitals: Ht 5\' 9"  (1.753 m)   Wt 229 lb (103.9 kg) Comment: patient reported  BMI 33.82 kg/m   Body mass index is 33.82 kg/m.  Advanced Directives 04/14/2019 06/10/2012  Does Patient Have a Medical Advance Directive? No Patient does not have advance directive;Patient would not like information  Would patient like information on creating a medical advance directive? Yes (MAU/Ambulatory/Procedural Areas - Information given) -  Pre-existing out of facility DNR order (yellow form or pink MOST form) - No    Tobacco Social History   Tobacco Use  Smoking Status Former Smoker  . Packs/day: 1.00  . Years: 10.00  . Pack years: 10.00  . Types: Cigarettes  . Quit date: 01/31/1954  . Years since quitting: 65.2  Smokeless Tobacco Former Systems developer  . Types: Chew  . Quit date: 02/01/1964     Counseling given: Not Answered   Clinical Intake:  Pre-visit preparation completed: Yes  Pain : 0-10 Pain Score: 4  Pain Type: Chronic pain Pain Location: Back Pain Orientation: Lower     Nutritional Status: BMI > 30  Obese Nutritional Risks: None Diabetes: No  How often do you need to have someone help you  when you read instructions, pamphlets, or other written materials from your doctor or pharmacy?: 1 - Never  Interpreter Needed?: No  Information entered by :: Franne Forts, LPN.  Past Medical History:  Diagnosis Date  . AAA (abdominal aortic aneurysm) ([place]) 2/14   3.8 cm 2/14  . Arthritis   . Atrial fibrillation ([place]) 2/14   acute pulmonary embolus  . Colon polyps   . Hyperlipidemia   . Hypertension   . Murmur, cardiac   . Saddle pulmonary embolus ([place]) 2/14   LLE DVT   Past Surgical History:  Procedure Laterality Date  . TONSILLECTOMY     Family History  Problem Relation Age of Onset  . Arthritis Mother   . Heart disease Mother   . Arthritis Father   . Heart disease Father   . Hypertension Father   . Heart disease Paternal Grandfather    Social History   Socioeconomic History  . Marital status: Married    Spouse name: Not on file  . Number of children: 3  . Years of education: Not on file  . Highest education level: Not on file  Occupational History  . Not on file  Tobacco Use  . Smoking status: Former Smoker    Packs/day: 1.00    Years: 10.00    Pack years: 10.00    Types: Cigarettes    Quit date: 01/31/1954    Years since quitting: 65.2  . Smokeless tobacco: Former Systems developer    Types: [place] date: 02/01/1964  Substance and Sexual Activity  .  Alcohol use: No  . Drug use: Not on file  . Sexual activity: Not on file  Other Topics Concern  . Not on file  Social History Narrative   Married & retired   3 sons + 5 grandchildren   Social Determinants of Health   Financial Resource Strain:   . Difficulty of Paying Living Expenses: Not on file  Food Insecurity:   . Worried About Charity fundraiser in the Last Year: Not on file  . Ran Out of Food in the Last Year: Not on file  Transportation Needs:   . Lack of Transportation (Medical): Not on file  . Lack of Transportation (Non-Medical): Not on file  Physical Activity:   . Days of Exercise per  Week: Not on file  . Minutes of Exercise per Session: Not on file  Stress:   . Feeling of Stress : Not on file  Social Connections:   . Frequency of Communication with Friends and Family: Not on file  . Frequency of Social Gatherings with Friends and Family: Not on file  . Attends Religious Services: Not on file  . Active Member of Clubs or Organizations: Not on file  . Attends Archivist Meetings: Not on file  . Marital Status: Not on file    Outpatient Encounter Medications as of 04/14/2019  Medication Sig  . albuterol (PROVENTIL HFA;VENTOLIN HFA) 108 (90 Base) MCG/ACT inhaler Inhale 2 puffs into the lungs every 4 (four) hours as needed for wheezing or shortness of breath.  Marland Kitchen amLODipine (NORVASC) 2.5 MG tablet Take 1 tablet (2.5 mg total) by mouth daily.  Marland Kitchen apixaban (ELIQUIS) 5 MG TABS tablet Take 5 mg by mouth 2 (two) times daily.  . dorzolamide-timolol (COSOPT) 22.3-6.8 MG/ML ophthalmic solution   . furosemide (LASIX) 40 MG tablet Take 1 tablet (40 mg total) by mouth daily.  Marland Kitchen gabapentin (NEURONTIN) 100 MG capsule Take 100 mg by mouth 3 (three) times daily.  Marland Kitchen HYDROcodone-acetaminophen (NORCO) 10-325 MG tablet Take 1 tablet by mouth every 6 (six) hours as needed.  . latanoprost (XALATAN) 0.005 % ophthalmic solution   . lidocaine (LIDODERM) 5 % Place 1 patch onto the skin daily. Remove & Discard patch within 12 hours or as directed by MD  . lisinopril (ZESTRIL) 20 MG tablet Take 1 tablet (20 mg total) by mouth daily.  Marland Kitchen lovastatin (MEVACOR) 20 MG tablet TAKE ONE TABLET DAILY AT BEDTIME  . metoprolol (LOPRESSOR) 50 MG tablet TAKE ONE TABLET BY MOUTH TWICE DAILY  . potassium chloride (K-DUR) 10 MEQ tablet TAKE 1 TABLET DAILY  . traZODone (DESYREL) 50 MG tablet Take 1 tablet (50 mg total) by mouth at bedtime.  . DULoxetine (CYMBALTA) 60 MG capsule TAKE ONE (1) CAPSULE EACH DAY (Patient not taking: Reported on 04/14/2019)   No facility-administered encounter medications on  file as of 04/14/2019.    Activities of Daily Living In your present state of health, do you have any difficulty performing the following activities: 04/14/2019  Hearing? N  Vision? N  Difficulty concentrating or making decisions? Y  Comment sometimes  Walking or climbing stairs? N  Dressing or bathing? N  Doing errands, shopping? Y  Preparing Food and eating ? N  Using the Toilet? N  In the past six months, have you accidently leaked urine? N  Do you have problems with loss of bowel control? N  Managing your Medications? Y  Comment wife helps him  Managing your Finances? Y  Comment wife does it  Housekeeping or managing your Housekeeping? Y  Comment wife does it  Some recent data might be hidden    Patient Care Team: [name], MD as PCP - General (Family Medicine)   Assessment:   This is a routine wellness examination for Jerry Fox.  Exercise Activities and Dietary recommendations Current Exercise Habits: The patient does not participate in regular exercise at present, Exercise limited by: orthopedic condition(s)  Goals    . Cut out extra servings    . DIET - REDUCE SUGAR INTAKE       Fall Risk Fall Risk  04/14/2019 08/14/2017 01/23/2016 11/25/2014 11/18/2013  Falls in the past year? 0 No Yes No No  Number falls in past yr: - - 1 - -  Injury with Fall? - - No - -  Follow up - - Falls evaluation completed;Education provided - -   Is the patient's home free of loose throw rugs in walkways, pet beds, electrical cords, etc?   yes      Grab bars in the bathroom? yes      Handrails on the stairs?   yes      Adequate lighting?   yes  Timed Get Up and Go Performed: N/A due to telephone visit  Depression Screen PHQ 2/9 Scores 04/14/2019 08/14/2017 01/23/2016 11/25/2014  PHQ - 2 Score 0 0 0 0    Cognitive Function     6CIT Screen 04/14/2019  What Year? 0 points  What month? 0 points  What time? 0 points  Count back from 20 0 points  Months in reverse 2 points    Repeat phrase 2 points  Total Score 4    Immunization History  Administered Date(s) Administered  . Fluad Quad(high Dose 65+) 01/16/2019  . Influenza Split 02/01/2011, 04/09/2012  . Influenza, High Dose Seasonal PF 02/20/2013, 03/03/2014, 02/28/2016, 04/01/2017  . Pneumococcal Conjugate-13 11/25/2014  . Pneumococcal Polysaccharide-23 11/28/2015  . Tdap 11/25/2014  . Zoster Recombinat (Shingrix) 10/07/2017, 12/14/2017    Qualifies for Shingles Vaccine? Patient has already completed  Screening Tests Health Maintenance  Topic Date Due  . TETANUS/TDAP  11/24/2024  . INFLUENZA VACCINE  Completed  . PNA vac Low Risk Adult  Completed   Cancer Screenings: Lung: Low Dose CT Chest recommended if Age 94-80 years, 30 pack-year currently smoking OR have quit w/in 15years. Patient does not qualify. Colorectal: No longer needed due to age  Additional Screenings:  Hepatitis C Screening: N/A due to age      Plan:    Instructed to schedule routine visit with Dr. Elease Hashimoto (can be telephone visit if preferred) Patient is up to date with preventive health at this time.   I have personally reviewed and noted the following in the patient's chart:   . Medical and social history . Use of alcohol, tobacco or illicit drugs  . Current medications and supplements . Functional ability and status . Nutritional status . Physical activity . Advanced directives . List of other physicians . Hospitalizations, surgeries, and ER visits in previous 12 months . Vitals . Screenings to include cognitive, depression, and falls . Referrals and appointments  In addition, I have reviewed and discussed with patient certain preventive protocols, quality metrics, and best practice recommendations. A written personalized care plan for preventive services as well as general preventive health recommendations were provided to patient.     Franne Forts, LPN  [id number]

## 2019-05-11 ENCOUNTER — Other Ambulatory Visit: Payer: Self-pay | Admitting: Family Medicine

## 2019-05-11 NOTE — Telephone Encounter (Signed)
He is followed at the [facility], so we need to confirm whether they have taken him off.  I do not recall stopping this.  Would not refill until we clarify.

## 2019-05-11 NOTE — Telephone Encounter (Signed)
This medication is not on the med list anymore and not sure if it was accidentally taken off or if he is still on this medication? Please advise.

## 2019-05-12 ENCOUNTER — Telehealth: Payer: Self-pay | Admitting: Family Medicine

## 2019-05-12 ENCOUNTER — Telehealth: Payer: Self-pay | Admitting: *Deleted

## 2019-05-12 NOTE — Telephone Encounter (Signed)
Error

## 2019-05-12 NOTE — Telephone Encounter (Signed)
Pts wife called stating that the pt is no longer able to be seen at the [facility]. She states the pt is needing his blood thinner refilled and mentioned having his eliquis refilled. Please advise .     [address], [place]  [place] [address]  Phone: [phone] Fax: [phone]  Not a 24 hour pharmacy; exact hours not known.

## 2019-05-12 NOTE — Telephone Encounter (Signed)
Copied from [place] [id number]. Topic: General - Other >> May 12, 2019 12:17 PM Celene Kras wrote: Reason for CRM: Pts wife called and is requesting to have the pts medical records printed out from 2018-2020 for insurance purposes. Please advise.

## 2020-06-20 NOTE — Progress Notes (Addendum)
Subjective:   Jerry Fox is a 87 y.o. male who presents for Medicare Annual/Subsequent preventive examination.  I connected with Jerry Fox today by telephone and verified that I am speaking with the correct person using two identifiers. Location patient: home Location provider: work Persons participating in the virtual visit: patient, provider.   I discussed the limitations, risks, security and privacy concerns of performing an evaluation and management service by telephone and the availability of in person appointments. I also discussed with the patient that there may be a patient responsible charge related to this service. The patient expressed understanding and verbally consented to this telephonic visit.    Interactive audio and video telecommunications were attempted between this provider and patient, however failed, due to patient having technical difficulties OR patient did not have access to video capability.  We continued and completed visit with audio only.      Review of Systems    N/A  Cardiac Risk Factors include: advanced age (>12men, >68 women);male gender;hypertension;dyslipidemia     Objective:    Today's Vitals   There is no height or weight on file to calculate BMI.  Advanced Directives 06/21/2020 04/14/2019 06/10/2012  Does Patient Have a Medical Advance Directive? Yes No Patient does not have advance directive;Patient would not like information  Type of Scientist, forensic Power of Jerry Fox;Living will - -  Does patient want to make changes to medical advance directive? No - Patient declined - -  Copy of [place] in Chart? No - copy requested - -  Would patient like information on creating a medical advance directive? - Yes (MAU/Ambulatory/Procedural Areas - Information given) -  Pre-existing out of facility DNR order (yellow form or pink MOST form) - - No    Current Medications (verified) Outpatient Encounter  Medications as of 06/21/2020  Medication Sig  . amLODipine (NORVASC) 2.5 MG tablet Take 1 tablet (2.5 mg total) by mouth daily.  Marland Kitchen apixaban (ELIQUIS) 5 MG TABS tablet Take 5 mg by mouth 2 (two) times daily.  . dorzolamide-timolol (COSOPT) 22.3-6.8 MG/ML ophthalmic solution   . furosemide (LASIX) 40 MG tablet Take 1 tablet (40 mg total) by mouth daily.  Marland Kitchen gabapentin (NEURONTIN) 100 MG capsule Take 100 mg by mouth 3 (three) times daily.  Marland Kitchen latanoprost (XALATAN) 0.005 % ophthalmic solution   . lidocaine (LIDODERM) 5 % Place 1 patch onto the skin daily. Remove & Discard patch within 12 hours or as directed by MD  . lisinopril (ZESTRIL) 20 MG tablet Take 1 tablet (20 mg total) by mouth daily.  Marland Kitchen lovastatin (MEVACOR) 20 MG tablet TAKE ONE TABLET DAILY AT BEDTIME  . metoprolol (LOPRESSOR) 50 MG tablet TAKE ONE TABLET BY MOUTH TWICE DAILY  . potassium chloride (K-DUR) 10 MEQ tablet TAKE 1 TABLET DAILY  . traZODone (DESYREL) 50 MG tablet Take 1 tablet (50 mg total) by mouth at bedtime.  Marland Kitchen albuterol (PROVENTIL HFA;VENTOLIN HFA) 108 (90 Base) MCG/ACT inhaler Inhale 2 puffs into the lungs every 4 (four) hours as needed for wheezing or shortness of breath. (Patient not taking: Reported on 06/21/2020)  . DULoxetine (CYMBALTA) 60 MG capsule TAKE ONE (1) CAPSULE EACH DAY (Patient not taking: No sig reported)  . HYDROcodone-acetaminophen (NORCO) 10-325 MG tablet Take 1 tablet by mouth every 6 (six) hours as needed. (Patient not taking: Reported on 06/21/2020)  . XARELTO 20 MG TABS tablet TAKE (1) TABLET DAILY WITH SUPPER. (Patient not taking: Reported on 06/21/2020)   No facility-administered  encounter medications on file as of 06/21/2020.    Allergies (verified) Diltiazem   History: Past Medical History:  Diagnosis Date  . AAA (abdominal aortic aneurysm) ([place]) 2/14   3.8 cm 2/14  . Arthritis   . Atrial fibrillation ([place]) 2/14   acute pulmonary embolus  . Colon polyps   . Hyperlipidemia   . Hypertension    . Murmur, cardiac   . Saddle pulmonary embolus ([place]) 2/14   LLE DVT   Past Surgical History:  Procedure Laterality Date  . TONSILLECTOMY     Family History  Problem Relation Age of Onset  . Arthritis Mother   . Heart disease Mother   . Arthritis Father   . Heart disease Father   . Hypertension Father   . Heart disease Paternal Grandfather    Social History   Socioeconomic History  . Marital status: Married    Spouse name: Not on file  . Number of children: 3  . Years of education: Not on file  . Highest education level: Not on file  Occupational History  . Not on file  Tobacco Use  . Smoking status: Former Smoker    Packs/day: 1.00    Years: 10.00    Pack years: 10.00    Types: Cigarettes    Quit date: 01/31/1954    Years since quitting: 66.4  . Smokeless tobacco: Former Systems developer    Types: [place] date: 02/01/1964  Vaping Use  . Vaping Use: Never used  Substance and Sexual Activity  . Alcohol use: No  . Drug use: Not on file  . Sexual activity: Not on file  Other Topics Concern  . Not on file  Social History Narrative   Married & retired   3 sons + 5 grandchildren   Social Determinants of Health   Financial Resource Strain: Low Risk   . Difficulty of Paying Living Expenses: Not hard at all  Food Insecurity: No Food Insecurity  . Worried About Charity fundraiser in the Last Year: Never true  . Ran Out of Food in the Last Year: Never true  Transportation Needs: No Transportation Needs  . Lack of Transportation (Medical): No  . Lack of Transportation (Non-Medical): No  Physical Activity: Inactive  . Days of Exercise per Week: 0 days  . Minutes of Exercise per Session: 0 min  Stress: No Stress Concern Present  . Feeling of Stress : Not at all  Social Connections: Moderately Isolated  . Frequency of Communication with Friends and Family: Once a week  . Frequency of Social Gatherings with Friends and Family: Three times a week  . Attends Religious  Services: Never  . Active Member of Clubs or Organizations: No  . Attends Archivist Meetings: Never  . Marital Status: Married    Tobacco Counseling Counseling given: Not Answered   Clinical Intake:  Pre-visit preparation completed: Yes  Pain : No/denies pain     Nutritional Risks: None Diabetes: No  How often do you need to have someone help you when you read instructions, pamphlets, or other written materials from your doctor or pharmacy?: 1 - Never  Diabetic?No   Interpreter Needed?: No  Information entered by :: [place] of Daily Living In your present state of health, do you have any difficulty performing the following activities: 06/21/2020  Hearing? Y  Comment has hearing aids to both ears  Vision? N  Difficulty concentrating or making decisions? Y  Walking or  climbing stairs? N  Dressing or bathing? N  Doing errands, shopping? Y  Preparing Food and eating ? N  Using the Toilet? N  In the past six months, have you accidently leaked urine? N  Do you have problems with loss of bowel control? N  Managing your Medications? N  Managing your Finances? N  Housekeeping or managing your Housekeeping? N  Some recent data might be hidden    Patient Care Team: [name], MD as PCP - General (Family Medicine)  Indicate any recent Medical Services you may have received from other than [facility] providers in the past year (date may be approximate).     Assessment:   This is a routine wellness examination for [place].  Hearing/Vision screen  Hearing Screening   125Hz  250Hz  500Hz  1000Hz  2000Hz  3000Hz  4000Hz  6000Hz  8000Hz   Right ear:           Left ear:           Vision Screening Comments: States gets eyes checked every 6 months. Has glaucoma. Wears reading glasses   Dietary issues and exercise activities discussed: Current Exercise Habits: The patient does not participate in regular exercise at present  Goals    . Cut out extra  servings    . DIET - REDUCE SUGAR INTAKE      Depression Screen PHQ 2/9 Scores 06/21/2020 04/14/2019 08/14/2017 01/23/2016 11/25/2014 11/18/2013  PHQ - 2 Score 0 0 0 0 0 0    Fall Risk Fall Risk  06/21/2020 04/14/2019 08/14/2017 01/23/2016 11/25/2014  Falls in the past year? 0 0 No Yes No  Number falls in past yr: 0 - - 1 -  Injury with Fall? 0 - - No -  Risk for fall due to : No Fall Risks - - - -  Follow up Falls evaluation completed;Falls prevention discussed - - Falls evaluation completed;Education provided -    FALL RISK PREVENTION PERTAINING TO THE HOME:  Any stairs in or around the home? No  If so, are there any without handrails? No  Home free of loose throw rugs in walkways, pet beds, electrical cords, etc? Yes  Adequate lighting in your home to reduce risk of falls? Yes   ASSISTIVE DEVICES UTILIZED TO PREVENT FALLS:  Life alert? No  Use of a cane, walker or w/c? No  Grab bars in the bathroom? No  Shower chair or bench in shower? No  Elevated toilet seat or a handicapped toilet? No    Cognitive Function:   Normal cognitive status assessed by direct observation by this Nurse Health Advisor. No abnormalities found.     6CIT Screen 04/14/2019  What Year? 0 points  What month? 0 points  What time? 0 points  Count back from 20 0 points  Months in reverse 2 points  Repeat phrase 2 points  Total Score 4    Immunizations Immunization History  Administered Date(s) Administered  . Fluad Quad(high Dose 65+) 01/16/2019  . Influenza Split 02/01/2011, 04/09/2012  . Influenza, High Dose Seasonal PF 02/20/2013, 03/03/2014, 02/28/2016, 04/01/2017  . Pneumococcal Conjugate-13 11/25/2014  . Pneumococcal Polysaccharide-23 11/28/2015  . Tdap 11/25/2014  . Zoster Recombinat (Shingrix) 10/07/2017, 12/14/2017    TDAP status: Up to date  Flu Vaccine status: Due, Education has been provided regarding the importance of this vaccine. Advised may receive this vaccine at local pharmacy  or Health Dept. Aware to provide a copy of the vaccination record if obtained from local pharmacy or Health Dept. Verbalized acceptance and  understanding.  Pneumococcal vaccine status: Up to date  Covid-19 vaccine status: Completed vaccines  Qualifies for Shingles Vaccine? Yes   Zostavax completed No   Shingrix Completed?: Yes  Screening Tests Health Maintenance  Topic Date Due  . COVID-19 Vaccine (1) Never done  . INFLUENZA VACCINE  11/29/2019  . TETANUS/TDAP  11/24/2024  . PNA vac Low Risk Adult  Completed    Health Maintenance  Health Maintenance Due  Topic Date Due  . COVID-19 Vaccine (1) Never done  . INFLUENZA VACCINE  11/29/2019    Colorectal cancer screening: No longer required.   Lung Cancer Screening: (Low Dose CT Chest recommended if Age 40-80 years, 30 pack-year currently smoking OR have quit w/in 15years.) does not qualify.   Lung Cancer Screening Referral: N/A   Additional Screening:  Hepatitis C Screening: does not qualify;   Vision Screening: Recommended annual ophthalmology exams for early detection of glaucoma and other disorders of the eye. Is the patient up to date with their annual eye exam?  Yes  Who is the provider or what is the name of the office in which the patient attends annual eye exams? Dr. Kathlen Mody  If pt is not established with a provider, would they like to be referred to a provider to establish care? No .   Dental Screening: Recommended annual dental exams for proper oral hygiene  Community Resource Referral / Chronic Care Management: CRR required this visit?  No   CCM required this visit?  No      Plan:     I have personally reviewed and noted the following in the patient's chart:   . Medical and social history . Use of alcohol, tobacco or illicit drugs  . Current medications and supplements . Functional ability and status . Nutritional status . Physical activity . Advanced directives . List of other  physicians . Hospitalizations, surgeries, and ER visits in previous 12 months . Vitals . Screenings to include cognitive, depression, and falls . Referrals and appointments  In addition, I have reviewed and discussed with patient certain preventive protocols, quality metrics, and best practice recommendations. A written personalized care plan for preventive services as well as general preventive health recommendations were provided to patient.     Ofilia Neas, LPN   01/26/2445   Nurse Notes: None

## 2020-06-21 ENCOUNTER — Other Ambulatory Visit: Payer: Self-pay

## 2020-06-21 ENCOUNTER — Ambulatory Visit (INDEPENDENT_AMBULATORY_CARE_PROVIDER_SITE_OTHER): Payer: Medicare PPO

## 2020-06-21 DIAGNOSIS — Z0000 Encounter for general adult medical examination without abnormal findings: Secondary | ICD-10-CM

## 2020-06-21 NOTE — Patient Instructions (Signed)
Jerry Fox , Thank you for taking time to come for your Medicare Wellness Visit. I appreciate your ongoing commitment to your health goals. Please review the following plan we discussed and let me know if I can assist you in the future.   Screening recommendations/referrals: Colonoscopy: No longer required  Recommended yearly ophthalmology/optometry visit for glaucoma screening and checkup Recommended yearly dental visit for hygiene and checkup  Vaccinations: Influenza vaccine: Currently due, you may receive in our office or at your local pharmacy.  Pneumococcal vaccine: Completed series  Tdap vaccine: Up to date, next due 11/24/2024 Shingles vaccine: Completed series     Advanced directives: Please bring copies of your advanced medical directives so that we may scan into your chart  Conditions/risks identified: None   Next appointment: 07/04/2020 @ 11:00 am with Dr. Elease Hashimoto  Preventive Care 65 Years and Older, Male Preventive care refers to lifestyle choices and visits with your health care Jerry Fox that can promote health and wellness. What does preventive care include?  A yearly physical exam. This is also called an annual well check.  Dental exams once or twice a year.  Routine eye exams. Ask your health care Jerry Fox how often you should have your eyes checked.  Personal lifestyle choices, including:  Daily care of your teeth and gums.  Regular physical activity.  Eating a healthy diet.  Avoiding tobacco and drug use.  Limiting alcohol use.  Practicing safe sex.  Taking low doses of aspirin every day.  Taking vitamin and mineral supplements as recommended by your health care Jerry Fox. What happens during an annual well check? The services and screenings done by your health care Jerry Fox during your annual well check will depend on your age, overall health, lifestyle risk factors, and family history of disease. Counseling  Your health care Jerry Fox may ask  you questions about your:  Alcohol use.  Tobacco use.  Drug use.  Emotional well-being.  Home and relationship well-being.  Sexual activity.  Eating habits.  History of falls.  Memory and ability to understand (cognition).  Work and work Statistician. Screening  You may have the following tests or measurements:  Height, weight, and BMI.  Blood pressure.  Lipid and cholesterol levels. These may be checked every 5 years, or more frequently if you are over 38 years old.  Skin check.  Lung cancer screening. You may have this screening every year starting at age 70 if you have a 30-pack-year history of smoking and currently smoke or have quit within the past 15 years.  Fecal occult blood test (FOBT) of the stool. You may have this test every year starting at age 85.  Flexible sigmoidoscopy or colonoscopy. You may have a sigmoidoscopy every 5 years or a colonoscopy every 10 years starting at age 67.  Prostate cancer screening. Recommendations will vary depending on your family history and other risks.  Hepatitis C blood test.  Hepatitis B blood test.  Sexually transmitted disease (STD) testing.  Diabetes screening. This is done by checking your blood sugar (glucose) after you have not eaten for a while (fasting). You may have this done every 1-3 years.  Abdominal aortic aneurysm (AAA) screening. You may need this if you are a current or former smoker.  Osteoporosis. You may be screened starting at age 73 if you are at high risk. Talk with your health care Jerry Fox about your test results, treatment options, and if necessary, the need for more tests. Vaccines  Your health care Jerry Fox may recommend certain  vaccines, such as:  Influenza vaccine. This is recommended every year.  Tetanus, diphtheria, and acellular pertussis (Tdap, Td) vaccine. You may need a Td booster every 10 years.  Zoster vaccine. You may need this after age 3.  Pneumococcal 13-valent conjugate  (PCV13) vaccine. One dose is recommended after age 76.  Pneumococcal polysaccharide (PPSV23) vaccine. One dose is recommended after age 48. Talk to your health care Jerry Fox about which screenings and vaccines you need and how often you need them. This information is not intended to replace advice given to you by your health care Jerry Fox. Make sure you discuss any questions you have with your health care Jerry Fox. Document Released: 05/13/2015 Document Revised: 01/04/2016 Document Reviewed: 02/15/2015 Elsevier Interactive Patient Education  2017 Jerry Fox Prevention in the Home Falls can cause injuries. They can happen to people of all ages. There are many things you can do to make your home safe and to help prevent falls. What can I do on the outside of my home?  Regularly fix the edges of walkways and driveways and fix any cracks.  Remove anything that might make you trip as you walk through a door, such as a raised step or threshold.  Trim any bushes or trees on the path to your home.  Use bright outdoor lighting.  Clear any walking paths of anything that might make someone trip, such as rocks or tools.  Regularly check to see if handrails are loose or broken. Make sure that both sides of any steps have handrails.  Any raised decks and porches should have guardrails on the edges.  Have any leaves, snow, or ice cleared regularly.  Use sand or salt on walking paths during winter.  Clean up any spills in your garage right away. This includes oil or grease spills. What can I do in the bathroom?  Use night lights.  Install grab bars by the toilet and in the tub and shower. Do not use towel bars as grab bars.  Use non-skid mats or decals in the tub or shower.  If you need to sit down in the shower, use a plastic, non-slip stool.  Keep the floor dry. Clean up any water that spills on the floor as soon as it happens.  Remove soap buildup in the tub or shower  regularly.  Attach bath mats securely with double-sided non-slip rug tape.  Do not have throw rugs and other things on the floor that can make you trip. What can I do in the bedroom?  Use night lights.  Make sure that you have a light by your bed that is easy to reach.  Do not use any sheets or blankets that are too big for your bed. They should not hang down onto the floor.  Have a firm chair that has side arms. You can use this for support while you get dressed.  Do not have throw rugs and other things on the floor that can make you trip. What can I do in the kitchen?  Clean up any spills right away.  Avoid walking on wet floors.  Keep items that you use a lot in easy-to-reach places.  If you need to reach something above you, use a strong step stool that has a grab bar.  Keep electrical cords out of the way.  Do not use floor polish or wax that makes floors slippery. If you must use wax, use non-skid floor wax.  Do not have throw rugs and other things  on the floor that can make you trip. What can I do with my stairs?  Do not leave any items on the stairs.  Make sure that there are handrails on both sides of the stairs and use them. Fix handrails that are broken or loose. Make sure that handrails are as long as the stairways.  Check any carpeting to make sure that it is firmly attached to the stairs. Fix any carpet that is loose or worn.  Avoid having throw rugs at the top or bottom of the stairs. If you do have throw rugs, attach them to the floor with carpet tape.  Make sure that you have a light switch at the top of the stairs and the bottom of the stairs. If you do not have them, ask someone to add them for you. What else can I do to help prevent falls?  Wear shoes that:  Do not have high heels.  Have rubber bottoms.  Are comfortable and fit you well.  Are closed at the toe. Do not wear sandals.  If you use a stepladder:  Make sure that it is fully  opened. Do not climb a closed stepladder.  Make sure that both sides of the stepladder are locked into place.  Ask someone to hold it for you, if possible.  Clearly mark and make sure that you can see:  Any grab bars or handrails.  First and last steps.  Where the edge of each step is.  Use tools that help you move around (mobility aids) if they are needed. These include:  Canes.  Walkers.  Scooters.  Crutches.  Turn on the lights when you go into a dark area. Replace any light bulbs as soon as they burn out.  Set up your furniture so you have a clear path. Avoid moving your furniture around.  If any of your floors are uneven, fix them.  If there are any pets around you, be aware of where they are.  Review your medicines with your doctor. Some medicines can make you feel dizzy. This can increase your chance of falling. Ask your doctor what other things that you can do to help prevent falls. This information is not intended to replace advice given to you by your health care Stephenson Cichy. Make sure you discuss any questions you have with your health care Srah Ake. Document Released: 02/10/2009 Document Revised: 09/22/2015 Document Reviewed: 05/21/2014 Elsevier Interactive Patient Education  2017 [employer].

## 2020-07-04 ENCOUNTER — Telehealth (INDEPENDENT_AMBULATORY_CARE_PROVIDER_SITE_OTHER): Payer: Medicare PPO | Admitting: Family Medicine

## 2020-07-04 VITALS — BP 154/67 | HR 79 | Ht 69.0 in | Wt 235.0 lb

## 2020-07-04 DIAGNOSIS — E785 Hyperlipidemia, unspecified: Secondary | ICD-10-CM

## 2020-07-04 DIAGNOSIS — I10 Essential (primary) hypertension: Secondary | ICD-10-CM | POA: Diagnosis not present

## 2020-07-04 DIAGNOSIS — F5104 Psychophysiologic insomnia: Secondary | ICD-10-CM

## 2020-07-04 DIAGNOSIS — I714 Abdominal aortic aneurysm, without rupture, unspecified: Secondary | ICD-10-CM

## 2020-07-04 DIAGNOSIS — I480 Paroxysmal atrial fibrillation: Secondary | ICD-10-CM

## 2020-07-04 NOTE — Progress Notes (Signed)
Patient ID: Jerry Fox, male   DOB: Jul 02, 1933, 87 y.o.   MRN: [id number]  This visit type was conducted due to national recommendations for restrictions regarding the COVID-19 pandemic in an effort to limit this patient's exposure and mitigate transmission in our community.   Virtual Visit via Telephone Note  I connected with Jerry Fox on 07/04/20 at 11:00 AM EST by telephone and verified that I am speaking with the correct person using two identifiers.   I discussed the limitations, risks, security and privacy concerns of performing an evaluation and management service by telephone and the availability of in person appointments. I also discussed with the patient that there may be a patient responsible charge related to this service. The patient expressed understanding and agreed to proceed.  Location patient: home Location provider: work or home office Participants present for the call: patient, provider Patient did not have a visit in the prior 7 days to address this/these issue(s).   History of Present Illness: Jerry Fox has multiple chronic problems and is actually followed by the [facility] over in [place] on a more regular basis.  He is getting lab work through there and getting basically all of his medications to there.  He wishes to stay in touch and keep his medical base with our office still.  He states he feels well overall.  No recent falls.  No major complaint.  Appetite and weight are stable.  Chronic problems include: Hypertension, history of A. fib, abdominal aortic aneurysm, history of saddle pulmonary embolus, fatty liver changes, hyperlipidemia, chronic low back pain.  His medications were reviewed.  He had been on Xarelto and [facility] apparently switched him over to Eliquis.  He also remains on trazodone 50 mg nightly for chronic insomnia.  Takes potassium supplement, metoprolol, lovastatin, lisinopril, furosemide, gabapentin, Eliquis, amlodipine.  He is getting lab  work through the [place].  Not have copy of recent labs.  Home blood pressures have been ranging mostly 120s over 70s.  His pulse has been stable between 48 and 50s.  He has abdominal aortic aneurysm is getting yearly ultrasound follow-up through the [place] and currently stable somewhere in the 4 cm range.  Past Medical History:  Diagnosis Date  . AAA (abdominal aortic aneurysm) ([place]) 2/14   3.8 cm 2/14  . Arthritis   . Atrial fibrillation ([place]) 2/14   acute pulmonary embolus  . Colon polyps   . Hyperlipidemia   . Hypertension   . Murmur, cardiac   . Saddle pulmonary embolus ([facility]) 2/14   LLE DVT   Past Surgical History:  Procedure Laterality Date  . TONSILLECTOMY      reports that he quit smoking about 66 years ago. His smoking use included cigarettes. He has a 10.00 pack-year smoking history. He quit smokeless tobacco use about 56 years ago.  His smokeless tobacco use included chew. He reports that he does not drink alcohol. No history on file for drug use. family history includes Arthritis in his father and mother; Heart disease in his father, mother, and paternal grandfather; Hypertension in his father. Allergies  Allergen Reactions  . Diltiazem Rash      Observations/Objective: Patient sounds cheerful and well on the phone. I do not appreciate any SOB. Speech and thought processing are grossly intact. Patient reported vitals:  Assessment and Plan:  #1 atrial fibrillation history.  He also has past history of large saddle pulmonary embolus.  Remains on Eliquis per [facility]. -Bring copy of recent lab work He  comes in office  #2 hypertension-reportedly stable by home readings -Continue multiple medications including amlodipine, lisinopril, metoprolol  #3 dyslipidemia treated with lovastatin.  He is now on 40 mg of lovastatin -Bring in copy of recent lab work to review at follow-up  #4 chronic insomnia treated with trazodone.  He is currently sleeping very well.  Continue trazodone 50  mg nightly  #5 chronic low back pain.  Apparently stable currently on low-dose gabapentin 100 mg 3 times daily  Follow Up Instructions:  -They are encouraged to set up office follow-up in the next couple months and bring all his medications to review at that time   99441 [id number] [id number] 11-20 [id number] 21-30 I did not refer this patient for an OV in the next 24 hours for this/these issue(s).  I discussed the assessment and treatment plan with the patient. The patient was provided an opportunity to ask questions and all were answered. The patient agreed with the plan and demonstrated an understanding of the instructions.   The patient was advised to call back or seek an in-person evaluation if the symptoms worsen or if the condition fails to improve as anticipated.  I provided 28 minutes of non-face-to-face time during this encounter.   Jerry Littler, MD

## 2020-07-11 ENCOUNTER — Ambulatory Visit (INDEPENDENT_AMBULATORY_CARE_PROVIDER_SITE_OTHER): Payer: Medicare PPO | Admitting: Family Medicine

## 2020-07-11 ENCOUNTER — Other Ambulatory Visit: Payer: Self-pay

## 2020-07-11 ENCOUNTER — Encounter: Payer: Self-pay | Admitting: Family Medicine

## 2020-07-11 VITALS — BP 138/70 | HR 55 | Temp 97.8°F | Ht 69.0 in | Wt 238.8 lb

## 2020-07-11 DIAGNOSIS — I2692 Saddle embolus of pulmonary artery without acute cor pulmonale: Secondary | ICD-10-CM | POA: Diagnosis not present

## 2020-07-11 DIAGNOSIS — E785 Hyperlipidemia, unspecified: Secondary | ICD-10-CM | POA: Diagnosis not present

## 2020-07-11 DIAGNOSIS — F5104 Psychophysiologic insomnia: Secondary | ICD-10-CM | POA: Diagnosis not present

## 2020-07-11 DIAGNOSIS — M47816 Spondylosis without myelopathy or radiculopathy, lumbar region: Secondary | ICD-10-CM

## 2020-07-11 DIAGNOSIS — I10 Essential (primary) hypertension: Secondary | ICD-10-CM | POA: Diagnosis not present

## 2020-07-11 DIAGNOSIS — R600 Localized edema: Secondary | ICD-10-CM | POA: Diagnosis not present

## 2020-07-11 NOTE — Progress Notes (Signed)
Established Patient Office Visit  Subjective:  Patient ID: Jerry Fox, male    DOB: 1934-04-08  Age: 87 y.o. MRN: [id number]  CC:  Chief Complaint  Patient presents with  . Medication Refill    HPI Jerry Fox presents for medical follow-up.  Accompanied by his wife.  He is getting most of his care currently through the a health system and see several specialist there regularly.  He has history of abdominal aortic aneurysm, history of pulmonary embolus, hypertension, atrial fibrillation, and chronic insomnia.  He has some bilateral leg edema and wife states his edema today is actually slightly down from his usual amount.  Denies any orthopnea.  Very sedentary.  He apparently refuses to elevate legs during the day though family is tried.  They do not weigh him daily.  His weight is up about 10 pounds from 2 years ago.  Does take furosemide 40 mg daily.  Other medications include trazodone 50 mg nightly, potassium 10 mEq daily, metoprolol 50 mg twice daily, lovastatin 40 mg daily, losartan 100 mg daily, Eliquis 5 mg twice daily, amlodipine 2.5 mg daily.  They bring a copy of labs from the [place] from back in February.  He had CBC with hemoglobin 13.1 and platelet count 231,000.  Sodium normal.  Potassium 4.7.  BUN and creatinine 19/1.1.. He is compliant with all medications.  He reportedly had echocardiogram recently through the [place].  No recent falls.  Past Medical History:  Diagnosis Date  . AAA (abdominal aortic aneurysm) ([place]) 2/14   3.8 cm 2/14  . Arthritis   . Atrial fibrillation ([place]) 2/14   acute pulmonary embolus  . Colon polyps   . Hyperlipidemia   . Hypertension   . Murmur, cardiac   . Saddle pulmonary embolus ([place]) 2/14   LLE DVT    Past Surgical History:  Procedure Laterality Date  . TONSILLECTOMY      Family History  Problem Relation Age of Onset  . Arthritis Mother   . Heart disease Mother   . Arthritis Father   . Heart disease Father   . Hypertension  Father   . Heart disease Paternal Grandfather     Social History   Socioeconomic History  . Marital status: Married    Spouse name: Not on file  . Number of children: 3  . Years of education: Not on file  . Highest education level: Not on file  Occupational History  . Not on file  Tobacco Use  . Smoking status: Former Smoker    Packs/day: 1.00    Years: 10.00    Pack years: 10.00    Types: Cigarettes    Quit date: 01/31/1954    Years since quitting: 66.4  . Smokeless tobacco: Former Systems developer    Types: [place] date: 02/01/1964  Vaping Use  . Vaping Use: Never used  Substance and Sexual Activity  . Alcohol use: No  . Drug use: Not on file  . Sexual activity: Not on file  Other Topics Concern  . Not on file  Social History Narrative   Married & retired   3 sons + 5 grandchildren   Social Determinants of Health   Financial Resource Strain: Low Risk   . Difficulty of Paying Living Expenses: Not hard at all  Food Insecurity: No Food Insecurity  . Worried About Charity fundraiser in the Last Year: Never true  . Ran Out of Food in the Last Year: Never  true  Transportation Needs: No Transportation Needs  . Lack of Transportation (Medical): No  . Lack of Transportation (Non-Medical): No  Physical Activity: Inactive  . Days of Exercise per Week: 0 days  . Minutes of Exercise per Session: 0 min  Stress: No Stress Concern Present  . Feeling of Stress : Not at all  Social Connections: Moderately Isolated  . Frequency of Communication with Friends and Family: Once a week  . Frequency of Social Gatherings with Friends and Family: Three times a week  . Attends Religious Services: Never  . Active Member of Clubs or Organizations: No  . Attends Archivist Meetings: Never  . Marital Status: Married  Human resources officer Violence: Not At Risk  . Fear of Current or Ex-Partner: No  . Emotionally Abused: No  . Physically Abused: No  . Sexually Abused: No    Outpatient  Medications Prior to Visit  Medication Sig Dispense Refill  . albuterol (PROVENTIL HFA;VENTOLIN HFA) 108 (90 Base) MCG/ACT inhaler Inhale 2 puffs into the lungs every 4 (four) hours as needed for wheezing or shortness of breath. 1 Inhaler 0  . amLODipine (NORVASC) 2.5 MG tablet Take 1 tablet (2.5 mg total) by mouth daily. 90 tablet 3  . apixaban (ELIQUIS) 5 MG TABS tablet Take 5 mg by mouth 2 (two) times daily.    . dorzolamide-timolol (COSOPT) 22.3-6.8 MG/ML ophthalmic solution     . furosemide (LASIX) 40 MG tablet Take 1 tablet (40 mg total) by mouth daily. 90 tablet 3  . gabapentin (NEURONTIN) 100 MG capsule Take 100 mg by mouth 3 (three) times daily.    Marland Kitchen latanoprost (XALATAN) 0.005 % ophthalmic solution     . lidocaine (LIDODERM) 5 % Place 1 patch onto the skin daily. Remove & Discard patch within 12 hours or as directed by MD    . losartan (COZAAR) 100 MG tablet Take 100 mg by mouth daily.    Marland Kitchen lovastatin (MEVACOR) 40 MG tablet Take 40 mg by mouth at bedtime.    . metoprolol (LOPRESSOR) 50 MG tablet TAKE ONE TABLET BY MOUTH TWICE DAILY 180 tablet 1  . potassium chloride (K-DUR) 10 MEQ tablet TAKE 1 TABLET DAILY 90 tablet 0  . traZODone (DESYREL) 50 MG tablet Take 1 tablet (50 mg total) by mouth at bedtime. 90 tablet 3  . lisinopril (ZESTRIL) 20 MG tablet Take 1 tablet (20 mg total) by mouth daily. 30 tablet 0   No facility-administered medications prior to visit.    Allergies  Allergen Reactions  . Diltiazem Rash    ROS Review of Systems  Constitutional: Negative for fatigue and unexpected weight change.  Eyes: Negative for visual disturbance.  Respiratory: Negative for cough, chest tightness and shortness of breath.   Cardiovascular: Positive for leg swelling. Negative for chest pain and palpitations.  Neurological: Negative for dizziness, syncope, weakness, light-headedness and headaches.      Objective:    Physical Exam Vitals reviewed.  Constitutional:       Appearance: Normal appearance.  Cardiovascular:     Rate and Rhythm: Normal rate.  Pulmonary:     Effort: Pulmonary effort is normal.     Comments: He does have a few crackles in his right base.  No wheezes. Musculoskeletal:     Right lower leg: Edema present.     Left lower leg: Edema present.     Comments: He has trace pitting edema lower legs, feet, and ankles bilaterally  Neurological:     Mental  Status: He is alert.     BP 138/70 (BP Location: Left Arm, Patient Position: Sitting, Cuff Size: Large)   Pulse (!) 55   Temp 97.8 F (36.6 C) (Oral)   Ht 5\' 9"  (1.753 m)   Wt 238 lb 12.8 oz (108.3 kg)   SpO2 97%   BMI 35.26 kg/m  Wt Readings from Last 3 Encounters:  07/11/20 238 lb 12.8 oz (108.3 kg)  07/04/20 235 lb (106.6 kg)  04/14/19 229 lb (103.9 kg)     There are no preventive care reminders to display for this patient.  There are no preventive care reminders to display for this patient.  Lab Results  Component Value Date   TSH 0.59 08/14/2017   Lab Results  Component Value Date   WBC 7.3 08/14/2017   HGB 13.6 08/14/2017   HCT 39.5 08/14/2017   MCV 95.4 08/14/2017   PLT 263.0 08/14/2017   Lab Results  Component Value Date   NA 139 08/14/2017   K 5.1 08/14/2017   CO2 31 08/14/2017   GLUCOSE 100 (H) 08/14/2017   BUN 16 08/14/2017   CREATININE 0.92 08/14/2017   BILITOT 0.6 08/14/2017   ALKPHOS 43 08/14/2017   AST 13 08/14/2017   ALT 12 08/14/2017   PROT 6.6 08/14/2017   ALBUMIN 3.9 08/14/2017   CALCIUM 9.2 08/14/2017   GFR 83.78 08/14/2017   Lab Results  Component Value Date   CHOL 162 08/14/2017   Lab Results  Component Value Date   HDL 38.40 (L) 08/14/2017   Lab Results  Component Value Date   LDLCALC 102 (H) 08/14/2017   Lab Results  Component Value Date   TRIG 108.0 08/14/2017   Lab Results  Component Value Date   CHOLHDL 4 08/14/2017   Lab Results  Component Value Date   HGBA1C 5.2 06/10/2012      Assessment & Plan:   #1  bilateral leg edema.  Probably multifactorial.  Suspect largely venous stasis and may have some diastolic dysfunction.  No known history of systolic dysfunction.  He is very sedentary.  -Recommend more frequent elevation -Recommend daily weights each morning and be in touch for weight gain of 3 pounds in 1 day or 5 pounds 1 week -Suggested that he increase his furosemide 40 mg to 1 twice daily for 3 days then drop back to 1 daily -Watch sodium intake and try to keep less than 2400 mg daily  #2 hypertension stable -Continue current medications including metoprolol, losartan, furosemide, amlodipine  #3 history of atrial fibrillation.  Currently rate stable on metoprolol and on Eliquis 5 mg twice daily  #4 hyperlipidemia treated with lovastatin.  He states he is gotten labs through the [facility] [place].  They brought copy of recent labs and will need to confirm if had lipid in the past year.  #5 history of chronic insomnia currently treated with trazodone at night  No orders of the defined types were placed in this encounter.   Follow-up: No follow-ups on file.    Carolann Littler, MD

## 2020-07-11 NOTE — Patient Instructions (Addendum)
Edema  Edema is an abnormal buildup of fluids in the body tissues and under the skin. Swelling of the legs, feet, and ankles is a common symptom that becomes more likely as you get older. Swelling is also common in looser tissues, like around the eyes. When the affected area is squeezed, the fluid may move out of that spot and leave a dent for a few moments. This dent is called pitting edema. There are many possible causes of edema. Eating too much salt (sodium) and being on your feet or sitting for a long time can cause edema in your legs, feet, and ankles. Hot weather may make edema worse. Common causes of edema include:  Heart failure.  Liver or kidney disease.  Weak leg blood vessels.  Cancer.  An injury.  Pregnancy.  Medicines.  Being obese.  Low protein levels in the blood. Edema is usually painless. Your skin may look swollen or shiny. Follow these instructions at home:  Keep the affected body part raised (elevated) above the level of your heart when you are sitting or lying down.  Do not sit still or stand for long periods of time.  Do not wear tight clothing. Do not wear garters on your upper legs.  Exercise your legs to get your circulation going. This helps to move the fluid back into your blood vessels, and it may help the swelling go down.  Wear elastic bandages or support stockings to reduce swelling as told by your health care provider.  Eat a low-salt (low-sodium) diet to reduce fluid as told by your health care provider.  Depending on the cause of your swelling, you may need to limit how much fluid you drink (fluid restriction).  Take over-the-counter and prescription medicines only as told by your health care provider. Contact a health care provider if:  Your edema does not get better with treatment.  You have heart, liver, or kidney disease and have symptoms of edema.  You have sudden and unexplained weight gain. Get help right away if:  You develop  shortness of breath or chest pain.  You cannot breathe when you lie down.  You develop pain, redness, or warmth in the swollen areas.  You have heart, liver, or kidney disease and suddenly get edema.  You have a fever and your symptoms suddenly get worse. Summary  Edema is an abnormal buildup of fluids in the body tissues and under the skin.  Eating too much salt (sodium) and being on your feet or sitting for a long time can cause edema in your legs, feet, and ankles.  Keep the affected body part raised (elevated) above the level of your heart when you are sitting or lying down. This information is not intended to replace advice given to you by your health care provider. Make sure you discuss any questions you have with your health care provider. Document Revised: 09/03/2018 Document Reviewed: 05/19/2016 Elsevier Patient Education  2021 [employer].  Consider increasing the Furosemide 40 mg to one twice day for 3 days and then decrease back to once daily  Elevate legs regularly.    Call for weight gain of 3 pounds in one day or 5 pounds in one week.

## 2020-10-12 ENCOUNTER — Telehealth: Payer: Self-pay | Admitting: Family Medicine

## 2020-10-12 NOTE — Telephone Encounter (Signed)
Patient wife is calling and is requesting a refill for Losartan (not on med list) to be sent to    [facility] [address]  [place], [place] [address]  Phone:  [phone]  Fax:  [phone]  CB is [phone]

## 2020-10-13 MED ORDER — LOSARTAN POTASSIUM 100 MG PO TABS: 100.0000 mg | ORAL_TABLET | Freq: Every day | ORAL | 3 refills | Status: DC

## 2020-10-13 NOTE — Telephone Encounter (Signed)
Rx has been sent in. Nothing further needed. 

## 2021-04-20 ENCOUNTER — Telehealth: Payer: Self-pay | Admitting: Family Medicine

## 2021-04-20 ENCOUNTER — Ambulatory Visit: Payer: Medicare PPO | Admitting: Adult Health

## 2021-04-20 ENCOUNTER — Encounter: Payer: Self-pay | Admitting: Adult Health

## 2021-04-20 VITALS — BP 140/60 | HR 78 | Temp 98.2°F | Wt 239.6 lb

## 2021-04-20 DIAGNOSIS — J40 Bronchitis, not specified as acute or chronic: Secondary | ICD-10-CM | POA: Diagnosis not present

## 2021-04-20 LAB — POC INFLUENZA A&B (BINAX/QUICKVUE)
Influenza A, POC: NEGATIVE
Influenza B, POC: NEGATIVE

## 2021-04-20 LAB — POC COVID19 BINAXNOW: SARS Coronavirus 2 Ag: NEGATIVE

## 2021-04-20 MED ORDER — AZITHROMYCIN 250 MG PO TABS: ORAL_TABLET | ORAL | 0 refills | Status: AC

## 2021-04-20 MED ORDER — METHYLPREDNISOLONE ACETATE 80 MG/ML IJ SUSP: 80.0000 mg | Freq: Once | INTRAMUSCULAR | Status: DC

## 2021-04-20 MED ORDER — IPRATROPIUM-ALBUTEROL 0.5-2.5 (3) MG/3ML IN SOLN: 3.0000 mL | Freq: Once | RESPIRATORY_TRACT | Status: DC

## 2021-04-20 NOTE — Telephone Encounter (Signed)
Patient calling in with respiratory symptoms: Shortness of breath, chest pain, palpitations or other red words send to Triage  Does the patient have a fever over 100, cough, congestion, sore throat, runny nose, lost of taste/smell (please list symptoms that patient has)?bad cough, sinus issues  What date did symptoms start? (If over 5 days ago, pt may be scheduled for in person visit)  Have you tested for Covid in the last 5 days? No   If yes, was it positive []  OR negative [] ? If positive in the last 5 days, please schedule virtual visit now. If negative, schedule for an in person OV with the next available provider if PCP has no openings. Please also let patient know they will be tested again (follow the script below)  "you will have to arrive 48mins prior to your appt time to be Covid tested. Please park in back of office at the [facility] & call [phone] to let the staff know you have arrived. A staff member will meet you at your car to do a rapid covid test. Once the test has resulted you will be notified by phone of your results to determine if appt will remain an in person visit or be converted to a virtual/phone visit. If you arrive less than 27mins before your appt time, your visit will be automatically converted to virtual & any recommended testing will happen AFTER the visit."   Patient's wife called stating that her husband has a bad cough and sinus issues. Patient has not tested for COVID or flu. Patient will arrive 45 minutes early for testing.

## 2021-04-20 NOTE — Progress Notes (Signed)
Subjective:    Patient ID: Jerry Fox, male    DOB: 1934-03-11, 87 y.o.   MRN: [id number]  HPI  87 year old male who  has a past medical history of AAA (abdominal aortic aneurysm) ([place]) (2/14), Arthritis, Atrial fibrillation ([place]) (2/14), Colon polyps, Hyperlipidemia, Hypertension, Murmur, cardiac, and Saddle pulmonary embolus ([place]) (2/14).  He presents to the office today for an acute issue. His PCP is out of the office currently. He reports three days of semi productive cough and wheezing with mild chest congestions. Denies fevers/chills, or shortness of breath. Has history of bronchitis   Review of Systems See HPI   Past Medical History:  Diagnosis Date   AAA (abdominal aortic aneurysm) ([place]) 2/14   3.8 cm 2/14   Arthritis    Atrial fibrillation ([place]) 2/14   acute pulmonary embolus   Colon polyps    Hyperlipidemia    Hypertension    Murmur, cardiac    Saddle pulmonary embolus ([place]) 2/14   LLE DVT    Social History   Socioeconomic History   Marital status: Married    Spouse name: Not on file   Number of children: 3   Years of education: Not on file   Highest education level: Not on file  Occupational History   Not on file  Tobacco Use   Smoking status: Former    Packs/day: 1.00    Years: 10.00    Pack years: 10.00    Types: Cigarettes    Quit date: 01/31/1954    Years since quitting: 67.2   Smokeless tobacco: Former    Types: Chew    Quit date: 02/01/1964  Vaping Use   Vaping Use: Never used  Substance and Sexual Activity   Alcohol use: No   Drug use: Not on file   Sexual activity: Not on file  Other Topics Concern   Not on file  Social History Narrative   Married & retired   3 sons + 5 grandchildren   Social Determinants of Radio broadcast assistant Strain: Low Risk    Difficulty of Paying Living Expenses: Not hard at all  Food Insecurity: No Food Insecurity   Worried About Charity fundraiser in the Last Year: Never true   Academic librarian in the Last Year: Never true  Transportation Needs: No Transportation Needs   Lack of Transportation (Medical): No   Lack of Transportation (Non-Medical): No  Physical Activity: Inactive   Days of Exercise per Week: 0 days   Minutes of Exercise per Session: 0 min  Stress: No Stress Concern Present   Feeling of Stress : Not at all  Social Connections: Moderately Isolated   Frequency of Communication with Friends and Family: Once a week   Frequency of Social Gatherings with Friends and Family: Three times a week   Attends Religious Services: Never   Active Member of Clubs or Organizations: No   Attends Archivist Meetings: Never   Marital Status: Married  Human resources officer Violence: Not At Risk   Fear of Current or Ex-Partner: No   Emotionally Abused: No   Physically Abused: No   Sexually Abused: No    Past Surgical History:  Procedure Laterality Date   TONSILLECTOMY      Family History  Problem Relation Age of Onset   Arthritis Mother    Heart disease Mother    Arthritis Father    Heart disease Father    Hypertension Father  Heart disease Paternal Grandfather     Allergies  Allergen Reactions   Diltiazem Rash    Current Outpatient Medications on File Prior to Visit  Medication Sig Dispense Refill   albuterol (PROVENTIL HFA;VENTOLIN HFA) 108 (90 Base) MCG/ACT inhaler Inhale 2 puffs into the lungs every 4 (four) hours as needed for wheezing or shortness of breath. 1 Inhaler 0   amLODipine (NORVASC) 2.5 MG tablet Take 1 tablet (2.5 mg total) by mouth daily. 90 tablet 3   apixaban (ELIQUIS) 5 MG TABS tablet Take 5 mg by mouth 2 (two) times daily.     dorzolamide-timolol (COSOPT) 22.3-6.8 MG/ML ophthalmic solution      furosemide (LASIX) 40 MG tablet Take 1 tablet (40 mg total) by mouth daily. 90 tablet 3   gabapentin (NEURONTIN) 100 MG capsule Take 100 mg by mouth 3 (three) times daily.     latanoprost (XALATAN) 0.005 % ophthalmic solution      lidocaine  (LIDODERM) 5 % Place 1 patch onto the skin daily. Remove & Discard patch within 12 hours or as directed by MD     losartan (COZAAR) 100 MG tablet Take 1 tablet (100 mg total) by mouth daily. 90 tablet 3   lovastatin (MEVACOR) 40 MG tablet Take 40 mg by mouth at bedtime.     metoprolol (LOPRESSOR) 50 MG tablet TAKE ONE TABLET BY MOUTH TWICE DAILY 180 tablet 1   potassium chloride (K-DUR) 10 MEQ tablet TAKE 1 TABLET DAILY 90 tablet 0   traZODone (DESYREL) 50 MG tablet Take 1 tablet (50 mg total) by mouth at bedtime. 90 tablet 3   No current facility-administered medications on file prior to visit.    BP 140/60 (BP Location: Left Arm, Patient Position: Sitting, Cuff Size: Normal)    Pulse 78    Temp 98.2 F (36.8 C) (Oral)    Wt 239 lb 9.6 oz (108.7 kg)    SpO2 97%    BMI 35.38 kg/m       Objective:   Physical Exam Vitals and nursing note reviewed.  Constitutional:      Appearance: Normal appearance.  Cardiovascular:     Pulses: Normal pulses.     Heart sounds: Normal heart sounds.  Pulmonary:     Effort: Pulmonary effort is normal. No respiratory distress.     Breath sounds: No stridor. Wheezing (harsh expiratory wheezing throughout) present. No rhonchi.  Skin:    General: Skin is warm and dry.  Neurological:     General: No focal deficit present.     Mental Status: He is alert and oriented to person, place, and time.  Psychiatric:        Mood and Affect: Mood normal.        Behavior: Behavior normal.        Thought Content: Thought content normal.       Assessment & Plan:  1. Bronchitis -DuoNeb done in the office today.  After DuoNeb lung sounds still with wheezing but less harsh.  No rhonchi, rales, or stridor noted.  We will treat for bronchitis ?  Viral.  Will prescribe azithromycin and give Depo-Medrol injection in the office today.  Follow-up if no improvement in the next 3 to 4 days - POC Influenza A&B(BINAX/QUICKVUE)- negative  - POC COVID-19- negative  -  ipratropium-albuterol (DUONEB) 0.5-2.5 (3) MG/3ML nebulizer solution 3 mL - azithromycin (ZITHROMAX) 250 MG tablet; Take 2 tablets on day 1, then 1 tablet daily on days 2 through 5  Dispense: 6  tablet; Refill: 0 - methylPREDNISolone acetate (DEPO-MEDROL) injection 80 mg

## 2021-04-28 ENCOUNTER — Ambulatory Visit (INDEPENDENT_AMBULATORY_CARE_PROVIDER_SITE_OTHER): Payer: Medicare PPO

## 2021-04-28 ENCOUNTER — Other Ambulatory Visit: Payer: Self-pay

## 2021-04-28 ENCOUNTER — Ambulatory Visit: Payer: Medicare PPO | Admitting: Family Medicine

## 2021-04-28 VITALS — BP 130/60 | HR 55 | Temp 97.4°F | Wt 235.2 lb

## 2021-04-28 DIAGNOSIS — I878 Other specified disorders of veins: Secondary | ICD-10-CM

## 2021-04-28 DIAGNOSIS — R062 Wheezing: Secondary | ICD-10-CM

## 2021-04-28 DIAGNOSIS — R059 Cough, unspecified: Secondary | ICD-10-CM

## 2021-04-28 MED ORDER — ALBUTEROL SULFATE HFA 108 (90 BASE) MCG/ACT IN AERS
2.0000 | INHALATION_SPRAY | RESPIRATORY_TRACT | 0 refills | Status: DC | PRN
Start: 2021-04-28 — End: 2022-10-26

## 2021-04-28 NOTE — Progress Notes (Signed)
Established Patient Office Visit  Subjective:  Patient ID: Jerry Fox, male    DOB: [birth year]  Age: 87 y.o. MRN: [id number]  CC:  Chief Complaint  Patient presents with   Follow-up    HPI Jerry Fox presents for follow-up regarding persistent cough.  He was seen here on the 22nd and diagnosed with acute bronchitis.  He had evidence for some reactive airway disease and was given Depo-Medrol and started on Zithromax.  No reported fever.  Still coughing.  Mostly nonproductive cough.  No orthopnea.  No hemoptysis.  Patient was significantly briefly during his late teens and early 10s.  Wife states his cough is going on now for over a month.  She feels like he is wheezing some at night.  He has used Ventolin inhaler in the past as needed.  Does have some bilateral leg edema.  History of venous stasis.  Been seen at the [facility].  He has compressive device he uses intermittently at home.  Wife concerned that his legs look slightly darker than usual.  No pain.  Very sedentary.  Does not elevate legs much.  He does have history of saddle pulmonary embolus.  Likely has some pulmonary hypertension.  Does take furosemide 40 mg daily.  He has declined compression stockings in the past.  He is followed through the [facility] regarding his usual medications  Past Medical History:  Diagnosis Date   AAA (abdominal aortic aneurysm) 2/14   3.8 cm 2/14   Arthritis    Atrial fibrillation ([place]) 2/14   acute pulmonary embolus   Colon polyps    Hyperlipidemia    Hypertension    Murmur, cardiac    Saddle pulmonary embolus ([place]) 2/14   LLE DVT    Past Surgical History:  Procedure Laterality Date   TONSILLECTOMY      Family History  Problem Relation Age of Onset   Arthritis Mother    Heart disease Mother    Arthritis Father    Heart disease Father    Hypertension Father    Heart disease Paternal Grandfather     Social History   Socioeconomic History   Marital status: Married    Spouse  name: Not on file   Number of children: 3   Years of education: Not on file   Highest education level: Not on file  Occupational History   Not on file  Tobacco Use   Smoking status: Former    Packs/day: 1.00    Years: 10.00    Pack years: 10.00    Types: Cigarettes    Quit date: 01/31/1954    Years since quitting: 67.2   Smokeless tobacco: Former    Types: Chew    Quit date: 02/01/1964  Vaping Use   Vaping Use: Never used  Substance and Sexual Activity   Alcohol use: No   Drug use: Not on file   Sexual activity: Not on file  Other Topics Concern   Not on file  Social History Narrative   Married & retired   3 sons + 5 grandchildren   Social Determinants of Radio broadcast assistant Strain: Low Risk    Difficulty of Paying Living Expenses: Not hard at all  Food Insecurity: No Food Insecurity   Worried About Charity fundraiser in the Last Year: Never true   Arboriculturist in the Last Year: Never true  Transportation Needs: No Data processing manager (Medical): No  Lack of Transportation (Non-Medical): No  Physical Activity: Inactive   Days of Exercise per Week: 0 days   Minutes of Exercise per Session: 0 min  Stress: No Stress Concern Present   Feeling of Stress : Not at all  Social Connections: Moderately Isolated   Frequency of Communication with Friends and Family: Once a week   Frequency of Social Gatherings with Friends and Family: Three times a week   Attends Religious Services: Never   Active Member of Clubs or Organizations: No   Attends Archivist Meetings: Never   Marital Status: Married  Human resources officer Violence: Not At Risk   Fear of Current or Ex-Partner: No   Emotionally Abused: No   Physically Abused: No   Sexually Abused: No    Outpatient Medications Prior to Visit  Medication Sig Dispense Refill   amLODipine (NORVASC) 2.5 MG tablet Take 1 tablet (2.5 mg total) by mouth daily. 90 tablet 3   apixaban  (ELIQUIS) 5 MG TABS tablet Take 5 mg by mouth 2 (two) times daily.     dorzolamide-timolol (COSOPT) 22.3-6.8 MG/ML ophthalmic solution      furosemide (LASIX) 40 MG tablet Take 1 tablet (40 mg total) by mouth daily. 90 tablet 3   gabapentin (NEURONTIN) 100 MG capsule Take 100 mg by mouth 3 (three) times daily.     latanoprost (XALATAN) 0.005 % ophthalmic solution      lidocaine (LIDODERM) 5 % Place 1 patch onto the skin daily. Remove & Discard patch within 12 hours or as directed by MD     losartan (COZAAR) 100 MG tablet Take 1 tablet (100 mg total) by mouth daily. 90 tablet 3   lovastatin (MEVACOR) 40 MG tablet Take 40 mg by mouth at bedtime.     metoprolol (LOPRESSOR) 50 MG tablet TAKE ONE TABLET BY MOUTH TWICE DAILY 180 tablet 1   potassium chloride (K-DUR) 10 MEQ tablet TAKE 1 TABLET DAILY 90 tablet 0   traZODone (DESYREL) 50 MG tablet Take 1 tablet (50 mg total) by mouth at bedtime. 90 tablet 3   albuterol (PROVENTIL HFA;VENTOLIN HFA) 108 (90 Base) MCG/ACT inhaler Inhale 2 puffs into the lungs every 4 (four) hours as needed for wheezing or shortness of breath. 1 Inhaler 0   Facility-Administered Medications Prior to Visit  Medication Dose Route Frequency Provider Last Rate Last Admin   ipratropium-albuterol (DUONEB) 0.5-2.5 (3) MG/3ML nebulizer solution 3 mL  3 mL Nebulization Once Nafziger, Cory, NP       methylPREDNISolone acetate (DEPO-MEDROL) injection 80 mg  80 mg Intramuscular Once Nafziger, Tommi Rumps, NP        Allergies  Allergen Reactions   Diltiazem Rash    ROS Review of Systems  Constitutional:  Negative for chills and fever.  Respiratory:  Positive for cough and wheezing. Negative for stridor.   Cardiovascular:  Positive for leg swelling. Negative for chest pain and palpitations.  Gastrointestinal:  Negative for abdominal pain.  Neurological:  Negative for dizziness and headaches.     Objective:    Physical Exam Vitals reviewed.  Constitutional:      Appearance:  Normal appearance.  Cardiovascular:     Rate and Rhythm: Normal rate and regular rhythm.  Pulmonary:     Effort: Pulmonary effort is normal.     Breath sounds: Normal breath sounds. No wheezing or rales.  Musculoskeletal:     Right lower leg: Edema present.     Left lower leg: Edema present.     Comments:  Does have some mild nonpitting edema lower extremities bilaterally..  Mild bluish hue consistent with venous stasis disease.  No ulcerations.  No weepy edema  Neurological:     Mental Status: He is alert.    BP 130/60 (BP Location: Left Arm, Patient Position: Sitting, Cuff Size: Normal)    Pulse (!) 55    Temp (!) 97.4 F (36.3 C) (Oral)    Wt 235 lb 3.2 oz (106.7 kg)    SpO2 97%    BMI 34.73 kg/m  Wt Readings from Last 3 Encounters:  04/28/21 235 lb 3.2 oz (106.7 kg)  04/20/21 239 lb 9.6 oz (108.7 kg)  07/11/20 238 lb 12.8 oz (108.3 kg)     Health Maintenance Due  Topic Date Due   COVID-19 Vaccine (2 - Moderna series) 07/18/2020   INFLUENZA VACCINE  11/28/2020    There are no preventive care reminders to display for this patient.  Lab Results  Component Value Date   TSH 0.59 08/14/2017   Lab Results  Component Value Date   WBC 7.3 08/14/2017   HGB 13.6 08/14/2017   HCT 39.5 08/14/2017   MCV 95.4 08/14/2017   PLT 263.0 08/14/2017   Lab Results  Component Value Date   NA 139 08/14/2017   K 5.1 08/14/2017   CO2 31 08/14/2017   GLUCOSE 100 (H) 08/14/2017   BUN 16 08/14/2017   CREATININE 0.92 08/14/2017   BILITOT 0.6 08/14/2017   ALKPHOS 43 08/14/2017   AST 13 08/14/2017   ALT 12 08/14/2017   PROT 6.6 08/14/2017   ALBUMIN 3.9 08/14/2017   CALCIUM 9.2 08/14/2017   GFR 83.78 08/14/2017   Lab Results  Component Value Date   CHOL 162 08/14/2017   Lab Results  Component Value Date   HDL 38.40 (L) 08/14/2017   Lab Results  Component Value Date   LDLCALC 102 (H) 08/14/2017   Lab Results  Component Value Date   TRIG 108.0 08/14/2017   Lab Results   Component Value Date   CHOLHDL 4 08/14/2017   Lab Results  Component Value Date   HGBA1C 5.2 06/10/2012      Assessment & Plan:   #1 persistent cough with question of intermittent reactive airway issues.  Suspect probably postviral.  Recent COVID and influenza screens negative.  Wife has concerns regarding some ongoing reactive airway issues though no wheezing noted at this time.  He did receive Depo-Medrol recently.  We sent an albuterol inhaler refill which he is benefited from the past use only as needed.  Also obtain chest x-ray to further evaluate given duration of symptoms -Follow-up promptly for any fever or increased dyspnea  #2 chronic bilateral venous stasis edema -Recommend more frequent elevation -Would benefit from compression but patient declines -Continue current dose of furosemide -Try to increase activity levels with more walking   Meds ordered this encounter  Medications   albuterol (VENTOLIN HFA) 108 (90 Base) MCG/ACT inhaler    Sig: Inhale 2 puffs into the lungs every 4 (four) hours as needed for wheezing or shortness of breath.    Dispense:  1 each    Refill:  0    Follow-up: No follow-ups on file.    Carolann Littler, MD

## 2021-06-27 ENCOUNTER — Ambulatory Visit (INDEPENDENT_AMBULATORY_CARE_PROVIDER_SITE_OTHER): Payer: Medicare PPO

## 2021-06-27 ENCOUNTER — Ambulatory Visit: Payer: Medicare PPO

## 2021-06-27 VITALS — Ht 70.0 in | Wt 220.0 lb

## 2021-06-27 DIAGNOSIS — Z0000 Encounter for general adult medical examination without abnormal findings: Secondary | ICD-10-CM | POA: Diagnosis not present

## 2021-06-27 NOTE — Progress Notes (Signed)
Subjective:   Jerry Fox is a 88 y.o. male who presents for Medicare Annual/Subsequent preventive examination.  Review of Systems    Virtual Visit via Telephone Note  I connected with  [name] on 06/27/21 at  2:00 PM EST by telephone and verified that I am speaking with the correct person using two identifiers.  Location: Patient: Home Provider: Office Persons participating in the virtual visit: patient/Nurse Health Advisor   I discussed the limitations, risks, security and privacy concerns of performing an evaluation and management service by telephone and the availability of in person appointments. The patient expressed understanding and agreed to proceed.  Interactive audio and video telecommunications were attempted between this nurse and patient, however failed, due to patient having technical difficulties OR patient did not have access to video capability.  We continued and completed visit with audio only.  Some vital signs may be absent or patient reported.   Criselda Peaches, LPN  Cardiac Risk Factors include: advanced age (>88men, >55 women);hypertension;male gender     Objective:    Today's Vitals   06/27/21 1357  Weight: 220 lb (99.8 kg)  Height: 5\' 10"  (1.778 m)   Body mass index is 31.57 kg/m.  Advanced Directives 06/27/2021 06/21/2020 04/14/2019 06/10/2012  Does Patient Have a Medical Advance Directive? Yes Yes No Patient does not have advance directive;Patient would not like information  Type of Scientist, forensic Power of [place];Living will [place];Living will - -  Does patient want to make changes to medical advance directive? No - Patient declined No - Patient declined - -  Copy of [place] in Chart? No - copy requested No - copy requested - -  Would patient like information on creating a medical advance directive? - - Yes (MAU/Ambulatory/Procedural Areas - Information given) -   Pre-existing out of facility DNR order (yellow form or pink MOST form) - - - No    Current Medications (verified) Outpatient Encounter Medications as of 06/27/2021  Medication Sig   albuterol (VENTOLIN HFA) 108 (90 Base) MCG/ACT inhaler Inhale 2 puffs into the lungs every 4 (four) hours as needed for wheezing or shortness of breath.   amLODipine (NORVASC) 2.5 MG tablet Take 1 tablet (2.5 mg total) by mouth daily.   apixaban (ELIQUIS) 5 MG TABS tablet Take 5 mg by mouth 2 (two) times daily.   dorzolamide-timolol (COSOPT) 22.3-6.8 MG/ML ophthalmic solution    furosemide (LASIX) 40 MG tablet Take 1 tablet (40 mg total) by mouth daily.   gabapentin (NEURONTIN) 100 MG capsule Take 100 mg by mouth 3 (three) times daily.   latanoprost (XALATAN) 0.005 % ophthalmic solution    lidocaine (LIDODERM) 5 % Place 1 patch onto the skin daily. Remove & Discard patch within 12 hours or as directed by MD   losartan (COZAAR) 100 MG tablet Take 1 tablet (100 mg total) by mouth daily.   lovastatin (MEVACOR) 40 MG tablet Take 40 mg by mouth at bedtime.   metoprolol (LOPRESSOR) 50 MG tablet TAKE ONE TABLET BY MOUTH TWICE DAILY   potassium chloride (K-DUR) 10 MEQ tablet TAKE 1 TABLET DAILY   traZODone (DESYREL) 50 MG tablet Take 1 tablet (50 mg total) by mouth at bedtime.   No facility-administered encounter medications on file as of 06/27/2021.    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   LLE DVT   Past Surgical History:  Procedure Laterality Date   TONSILLECTOMY     Family History  Problem Relation Age of Onset   Arthritis Mother    Heart disease Mother    Arthritis Father    Heart disease Father    Hypertension Father     Heart disease Paternal Grandfather    Social History   Socioeconomic History   Marital status: Married    Spouse name: Not on file   Number of children: 3   Years of education: Not on file   Highest education level: Not on file  Occupational History   Not on file  Tobacco Use   Smoking status: Former    Packs/day: 1.00    Years: 10.00    Pack years: 10.00    Types: Cigarettes    Quit date: 01/31/1954    Years since quitting: 67.4   Smokeless tobacco: Former    Types: Chew    Quit date: 02/01/1964  Vaping Use   Vaping Use: Never used  Substance and Sexual Activity   Alcohol use: No   Drug use: Not on file   Sexual activity: Not on file  Other Topics Concern   Not on file  Social History Narrative   Married & retired   3 sons + 5 grandchildren   Social Determinants of Radio broadcast assistant Strain: Low Risk    Difficulty of Paying Living Expenses: Not hard at all  Food Insecurity: No Food Insecurity   Worried About Charity fundraiser in the Last Year: Never true   Arboriculturist in the Last Year: Never true  Transportation Needs: No Transportation Needs   Lack of Transportation (Medical): No   Lack of Transportation (Non-Medical): No  Physical Activity: Inactive   Days of Exercise per Week: 0 days   Minutes of Exercise per Session: 0 min  Stress: No Stress Concern Present   Feeling of Stress : Not at all  Social Connections: Unknown   Frequency of Communication with Friends and Family: More than three times a week   Frequency of Social Gatherings with Friends and Family: More than three times a week   Attends Religious Services: Not on Electrical engineer or Organizations: Yes   Attends Archivist Meetings: More than 4 times per year   Marital Status: Married    Tobacco Counseling Counseling given: Not Answered   Clinical Intake: How often do you need to have someone help you when you read instructions, pamphlets, or other  written materials from your doctor or pharmacy?: 1 - Never  Diabetic?  No   Activities of Daily Living In your present state of health, do you have any difficulty performing the following activities: 06/27/2021  Hearing? N  Vision? N  Difficulty concentrating or making decisions? N  Walking or climbing stairs? N  Dressing or bathing? N  Doing errands, shopping? N  Preparing Food and eating ? N  Using the Toilet? N  In the past six months, have you accidently leaked urine? N  Do you have problems with loss of bowel control? N  Managing your Medications? N  Managing your Finances? N  Housekeeping or managing your Housekeeping? N  Some recent data  might be hidden    Patient Care Team: [name], MD as PCP - General (Family Medicine)  Indicate any recent Medical Services you may have received from other than [facility] providers in the past year (date may be approximate).     Assessment:   This is a routine wellness examination for Declin.  Hearing/Vision screen Hearing Screening - Comments:: No difficulty hearing Vision Screening - Comments:: Wears reading glasses. Followed by [facility]  Dietary issues and exercise activities discussed: Exercise limited by: None identified   Goals Addressed               This Visit's Progress     Increase physical activity (pt-stated)        Would like to maintain good health.       Depression Screen PHQ 2/9 Scores 06/27/2021 04/28/2021 07/04/2020 06/21/2020 04/14/2019 08/14/2017 01/23/2016  PHQ - 2 Score 0 0 0 0 0 0 0    Fall Risk Fall Risk  06/27/2021 04/28/2021 07/04/2020 06/21/2020 04/14/2019  Falls in the past year? 0 0 0 0 0  Number falls in past yr: 0 - 0 0 -  Injury with Fall? 0 - 0 0 -  Risk for fall due to : No Fall Risks - No Fall Risks No Fall Risks -  Follow up - - - Falls evaluation completed;Falls prevention discussed -    FALL RISK PREVENTION PERTAINING TO THE HOME:  Any stairs in or around the home?  Yes  If so, are there any without handrails? No  Home free of loose throw rugs in walkways, pet beds, electrical cords, etc? Yes  Adequate lighting in your home to reduce risk of falls? Yes   ASSISTIVE DEVICES UTILIZED TO PREVENT FALLS:  Life alert? No  Use of a cane, walker or w/c? No  Grab bars in the bathroom? No  Shower chair or bench in shower? Yes  Elevated toilet seat or a handicapped toilet? No   TIMED UP AND GO:  Was the test performed? No . Audio Visit  Cognitive Function:     6CIT Screen 06/27/2021 04/14/2019  What Year? 0 points 0 points  What month? 0 points 0 points  What time? 0 points 0 points  Count back from 20 0 points 0 points  Months in reverse 0 points 2 points  Repeat phrase 0 points 2 points  Total Score 0 4    Immunizations Immunization History  Administered Date(s) Administered   Fluad Quad(high Dose 65+) 01/16/2019   Influenza Split 02/01/2011, 04/09/2012   Influenza, High Dose Seasonal PF 02/20/2013, 03/03/2014, 02/28/2016, 04/01/2017   Moderna Sars-Covid-2 Vaccination 06/20/2020   Pneumococcal Conjugate-13 11/25/2014   Pneumococcal Polysaccharide-23 11/28/2015   Tdap 11/25/2014   Zoster Recombinat (Shingrix) 10/07/2017, 12/14/2017    TDAP status: Up to date  Flu Vaccine status: Up to date  Pneumococcal vaccine status: Up to date  Covid-19 vaccine status: Completed vaccines  Qualifies for Shingles Vaccine? Yes   Zostavax completed Yes   Shingrix Completed?: Yes  Screening Tests Health Maintenance  Topic Date Due   COVID-19 Vaccine (2 - Moderna series) 07/13/2021 (Originally 07/18/2020)   INFLUENZA VACCINE  07/28/2021 (Originally 11/28/2020)   TETANUS/TDAP  11/24/2024   Pneumonia Vaccine 50+ Years old  Completed   Zoster Vaccines- Shingrix  Completed   HPV VACCINES  Aged Out    Health Maintenance  There are no preventive care reminders to display for this patient.   Colorectal cancer screening: No longer required.  Lung  Cancer Screening: (Low Dose CT Chest recommended if Age 81-80 years, 30 pack-year currently smoking OR have quit w/in 15years.) does not qualify.     Additional Screening:  Hepatitis C Screening: does not qualify; Completed   Vision Screening: Recommended annual ophthalmology exams for early detection of glaucoma and other disorders of the eye. Is the patient up to date with their annual eye exam?  Yes  Who is the provider or what is the name of the office in which the patient attends annual eye exams? [facility] If pt is not established with a provider, would they like to be referred to a provider to establish care? No .   Dental Screening: Recommended annual dental exams for proper oral hygiene  Community Resource Referral / Chronic Care Management:  CRR required this visit?  No   CCM required this visit?  No      Plan:     I have personally reviewed and noted the following in the patients chart:   Medical and social history Use of alcohol, tobacco or illicit drugs  Current medications and supplements including opioid prescriptions. Patient is not currently taking opioid prescriptions. Functional ability and status Nutritional status Physical activity Advanced directives List of other physicians Hospitalizations, surgeries, and ER visits in previous 12 months Vitals Screenings to include cognitive, depression, and falls Referrals and appointments  In addition, I have reviewed and discussed with patient certain preventive protocols, quality metrics, and best practice recommendations. A written personalized care plan for preventive services as well as general preventive health recommendations were provided to patient.     Criselda Peaches, LPN   10/10/2447   Nurse Notes: None

## 2021-06-27 NOTE — Patient Instructions (Addendum)
Jerry Fox , Thank you for taking time to come for your Medicare Wellness Visit. I appreciate your ongoing commitment to your health goals. Please review the following plan we discussed and let me know if I can assist you in the future.   These are the goals we discussed:  Goals       Cut out extra servings      DIET - REDUCE SUGAR INTAKE      Increase physical activity (pt-stated)      Would like to maintain good health.        This is a list of the screening recommended for you and due dates:  Health Maintenance  Topic Date Due   COVID-19 Vaccine (2 - Moderna series) 07/13/2021*   Flu Shot  07/28/2021*   Tetanus Vaccine  11/24/2024   Pneumonia Vaccine  Completed   Zoster (Shingles) Vaccine  Completed   HPV Vaccine  Aged Out  *Topic was postponed. The date shown is not the original due date.   Advanced directives: Yes  Patient will bring copy  Conditions/risks identified: None  Next appointment: Follow up in one year for your annual wellness visit.   Preventive Care 32 Years and Older, Male Preventive care refers to lifestyle choices and visits with your health care provider that can promote health and wellness. What does preventive care include? A yearly physical exam. This is also called an annual well check. Dental exams once or twice a year. Routine eye exams. Ask your health care provider how often you should have your eyes checked. Personal lifestyle choices, including: Daily care of your teeth and gums. Regular physical activity. Eating a healthy diet. Avoiding tobacco and drug use. Limiting alcohol use. Practicing safe sex. Taking low doses of aspirin every day. Taking vitamin and mineral supplements as recommended by your health care provider. What happens during an annual well check? The services and screenings done by your health care provider during your annual well check will depend on your age, overall health, lifestyle risk factors, and family  history of disease. Counseling  Your health care provider may ask you questions about your: Alcohol use. Tobacco use. Drug use. Emotional well-being. Home and relationship well-being. Sexual activity. Eating habits. History of falls. Memory and ability to understand (cognition). Work and work Statistician. Screening  You may have the following tests or measurements: Height, weight, and BMI. Blood pressure. Lipid and cholesterol levels. These may be checked every 5 years, or more frequently if you are over 69 years old. Skin check. Lung cancer screening. You may have this screening every year starting at age 60 if you have a 30-pack-year history of smoking and currently smoke or have quit within the past 15 years. Fecal occult blood test (FOBT) of the stool. You may have this test every year starting at age 62. Flexible sigmoidoscopy or colonoscopy. You may have a sigmoidoscopy every 5 years or a colonoscopy every 10 years starting at age 61. Prostate cancer screening. Recommendations will vary depending on your family history and other risks. Hepatitis C blood test. Hepatitis B blood test. Sexually transmitted disease (STD) testing. Diabetes screening. This is done by checking your blood sugar (glucose) after you have not eaten for a while (fasting). You may have this done every 1-3 years. Abdominal aortic aneurysm (AAA) screening. You may need this if you are a current or former smoker. Osteoporosis. You may be screened starting at age 74 if you are at high risk. Talk with your health  care provider about your test results, treatment options, and if necessary, the need for more tests. Vaccines  Your health care provider may recommend certain vaccines, such as: Influenza vaccine. This is recommended every year. Tetanus, diphtheria, and acellular pertussis (Tdap, Td) vaccine. You may need a Td booster every 10 years. Zoster vaccine. You may need this after age 33. Pneumococcal  13-valent conjugate (PCV13) vaccine. One dose is recommended after age 70. Pneumococcal polysaccharide (PPSV23) vaccine. One dose is recommended after age 63. Talk to your health care provider about which screenings and vaccines you need and how often you need them. This information is not intended to replace advice given to you by your health care provider. Make sure you discuss any questions you have with your health care provider. Document Released: 05/13/2015 Document Revised: 01/04/2016 Document Reviewed: 02/15/2015 Elsevier Interactive Patient Education  2017 [employer] Prevention in the Home Falls can cause injuries. They can happen to people of all ages. There are many things you can do to make your home safe and to help prevent falls. What can I do on the outside of my home? Regularly fix the edges of walkways and driveways and fix any cracks. Remove anything that might make you trip as you walk through a door, such as a raised step or threshold. Trim any bushes or trees on the path to your home. Use bright outdoor lighting. Clear any walking paths of anything that might make someone trip, such as rocks or tools. Regularly check to see if handrails are loose or broken. Make sure that both sides of any steps have handrails. Any raised decks and porches should have guardrails on the edges. Have any leaves, snow, or ice cleared regularly. Use sand or salt on walking paths during winter. Clean up any spills in your garage right away. This includes oil or grease spills. What can I do in the bathroom? Use night lights. Install grab bars by the toilet and in the tub and shower. Do not use towel bars as grab bars. Use non-skid mats or decals in the tub or shower. If you need to sit down in the shower, use a plastic, non-slip stool. Keep the floor dry. Clean up any water that spills on the floor as soon as it happens. Remove soap buildup in the tub or shower regularly. Attach  bath mats securely with double-sided non-slip rug tape. Do not have throw rugs and other things on the floor that can make you trip. What can I do in the bedroom? Use night lights. Make sure that you have a light by your bed that is easy to reach. Do not use any sheets or blankets that are too big for your bed. They should not hang down onto the floor. Have a firm chair that has side arms. You can use this for support while you get dressed. Do not have throw rugs and other things on the floor that can make you trip. What can I do in the kitchen? Clean up any spills right away. Avoid walking on wet floors. Keep items that you use a lot in easy-to-reach places. If you need to reach something above you, use a strong step stool that has a grab bar. Keep electrical cords out of the way. Do not use floor polish or wax that makes floors slippery. If you must use wax, use non-skid floor wax. Do not have throw rugs and other things on the floor that can make you trip. What can  I do with my stairs? Do not leave any items on the stairs. Make sure that there are handrails on both sides of the stairs and use them. Fix handrails that are broken or loose. Make sure that handrails are as long as the stairways. Check any carpeting to make sure that it is firmly attached to the stairs. Fix any carpet that is loose or worn. Avoid having throw rugs at the top or bottom of the stairs. If you do have throw rugs, attach them to the floor with carpet tape. Make sure that you have a light switch at the top of the stairs and the bottom of the stairs. If you do not have them, ask someone to add them for you. What else can I do to help prevent falls? Wear shoes that: Do not have high heels. Have rubber bottoms. Are comfortable and fit you well. Are closed at the toe. Do not wear sandals. If you use a stepladder: Make sure that it is fully opened. Do not climb a closed stepladder. Make sure that both sides of the  stepladder are locked into place. Ask someone to hold it for you, if possible. Clearly mark and make sure that you can see: Any grab bars or handrails. First and last steps. Where the edge of each step is. Use tools that help you move around (mobility aids) if they are needed. These include: Canes. Walkers. Scooters. Crutches. Turn on the lights when you go into a dark area. Replace any light bulbs as soon as they burn out. Set up your furniture so you have a clear path. Avoid moving your furniture around. If any of your floors are uneven, fix them. If there are any pets around you, be aware of where they are. Review your medicines with your doctor. Some medicines can make you feel dizzy. This can increase your chance of falling. Ask your doctor what other things that you can do to help prevent falls. This information is not intended to replace advice given to you by your health care provider. Make sure you discuss any questions you have with your health care provider. Document Released: 02/10/2009 Document Revised: 09/22/2015 Document Reviewed: 05/21/2014 Elsevier Interactive Patient Education  2017 [employer].

## 2021-08-10 ENCOUNTER — Ambulatory Visit: Payer: Medicare PPO | Admitting: Family Medicine

## 2021-09-04 ENCOUNTER — Ambulatory Visit (INDEPENDENT_AMBULATORY_CARE_PROVIDER_SITE_OTHER): Payer: Medicare PPO | Admitting: Family Medicine

## 2021-09-04 ENCOUNTER — Encounter: Payer: Self-pay | Admitting: Family Medicine

## 2021-09-04 VITALS — BP 128/52 | HR 47 | Temp 97.4°F | Ht 70.5 in | Wt 230.4 lb

## 2021-09-04 DIAGNOSIS — Z1321 Encounter for screening for nutritional disorder: Secondary | ICD-10-CM | POA: Diagnosis not present

## 2021-09-04 DIAGNOSIS — D235 Other benign neoplasm of skin of trunk: Secondary | ICD-10-CM | POA: Diagnosis not present

## 2021-09-04 DIAGNOSIS — E785 Hyperlipidemia, unspecified: Secondary | ICD-10-CM | POA: Diagnosis not present

## 2021-09-04 DIAGNOSIS — I10 Essential (primary) hypertension: Secondary | ICD-10-CM | POA: Diagnosis not present

## 2021-09-04 DIAGNOSIS — M5136 Other intervertebral disc degeneration, lumbar region: Secondary | ICD-10-CM

## 2021-09-04 MED ORDER — GABAPENTIN 300 MG PO CAPS: 300.0000 mg | ORAL_CAPSULE | Freq: Three times a day (TID) | ORAL | 3 refills | Status: DC

## 2021-09-04 NOTE — Progress Notes (Signed)
? ?Subjective:  ?Patient ID: Jerry Fox, male    DOB: [birth year]  Age: 88 y.o. MRN: [id number] ? ?CC: New Patient (Initial Visit) ? ? ?HPI ?Jerry Fox presents for new patient visit.  He has previously been followed by the [place].  It is a bit of a drive for him and his wife and their age.  They do continue to get the medications from there. ? ? Follow-up of hypertension. Patient has no history of headache chest pain or shortness of breath or recent cough. Patient also denies symptoms of TIA such as numbness weakness lateralizing. Patient checks  blood pressure at home and has not had any elevated readings recently. Patient denies side effects from his medication. States taking it regularly. ? ?Patient in for follow-up of elevated cholesterol. Doing well without complaints on current medication. Denies side effects of statin including myalgia and arthralgia and nausea. Also in today for liver function testing. Currently no chest pain, shortness of breath or other cardiovascular related symptoms noted. ? ?Patient in for follow-up of atrial fibrillation. Patient denies any recent bouts of chest pain or palpitations. Additionally, patient is taking anticoagulants. Patient denies any recent excessive bleeding episodes including epistaxis, bleeding from the gums, genitalia, rectal bleeding or hematuria. Additionally there has been no excessive bruising.  There is also a history of pulmonary embolism and deep venous thrombosis.  He has dependent edema.  However, his wife says he will not put his feet up at home.  He says it hurts his back.  He has a history of degenerative disc disease in the lumbar region.  This makes it painful for him to put his feet up.  He takes a small dose of gabapentin to help relieve that discomfort. ? ?Patient has a lesion on the back that he would like to have removed.  It itches and burns and is irritated most of the time.  Its been there for several years. ? ? ?  09/04/2021  ? 10:37 AM  06/27/2021  ?  2:02 PM 04/28/2021  ? 10:10 AM  ?Depression screen PHQ 2/9  ?Decreased Interest 0 0 0  ?Down, Depressed, Hopeless 0 0 0  ?PHQ - 2 Score 0 0 0  ? ? ?History ?Offie has a past medical history of AAA (abdominal aortic aneurysm) (Jerry Fox) (2/14), Arthritis, Atrial fibrillation (Jerry Fox) (2/14), Colon polyps, Hyperlipidemia, Hypertension, Murmur, cardiac, and Saddle pulmonary embolus (Jerry Fox) (2/14).  ? ?He has a past surgical history that includes Tonsillectomy.  ? ?His family history includes Arthritis in his father and mother; Heart disease in his father, mother, and paternal grandfather; Hypertension in his father.He reports that he quit smoking about 67 years ago. His smoking use included cigarettes. He has a 10.00 pack-year smoking history. He quit smokeless tobacco use about 57 years ago.  His smokeless tobacco use included chew. He reports that he does not drink alcohol. No history on file for drug use. ? ? ? ?ROS ?Review of Systems  ?Constitutional: Negative.   ?HENT: Negative.    ?Eyes:  Negative for visual disturbance.  ?Respiratory:  Negative for cough and shortness of breath.   ?Cardiovascular:  Negative for chest pain and leg swelling.  ?Gastrointestinal:  Negative for abdominal pain, diarrhea, nausea and vomiting.  ?Genitourinary:  Negative for difficulty urinating.  ?Musculoskeletal:  Negative for arthralgias and myalgias.  ?Skin:  Negative for rash.  ?Neurological:  Negative for headaches.  ?Psychiatric/Behavioral:  Negative for sleep disturbance.   ? ?Objective:  ?BP (!) 128/52  Pulse (!) 47   Temp (!) 97.4 ?F (36.3 ?C)   Ht 5' 10.5" (1.791 m)   Wt 230 lb 6.4 oz (104.5 kg)   SpO2 95%   BMI 32.59 kg/m?  ? ?BP Readings from Last 3 Encounters:  ?09/04/21 (!) 128/52  ?04/28/21 130/60  ?04/20/21 140/60  ? ? ?Wt Readings from Last 3 Encounters:  ?09/04/21 230 lb 6.4 oz (104.5 kg)  ?06/27/21 220 lb (99.8 kg)  ?04/28/21 235 lb 3.2 oz (106.7 kg)  ? ? ? ?Physical Exam ?Vitals reviewed.   ?Constitutional:   ?   General: He is in acute distress.  ?   Appearance: He is well-developed.  ?HENT:  ?   Head: Normocephalic and atraumatic.  ?   Right Ear: External ear normal.  ?   Left Ear: External ear normal.  ?   Nose: Nose normal.  ?Eyes:  ?   Conjunctiva/sclera: Conjunctivae normal.  ?   Pupils: Pupils are equal, round, and reactive to light.  ?Cardiovascular:  ?   Rate and Rhythm: Normal rate and regular rhythm.  ?   Heart sounds: Normal heart sounds. No murmur heard. ?Pulmonary:  ?   Effort: Pulmonary effort is normal. No respiratory distress.  ?   Breath sounds: Normal breath sounds. No wheezing or rales.  ?Abdominal:  ?   Palpations: Abdomen is soft.  ?   Tenderness: There is no abdominal tenderness.  ?Musculoskeletal:     ?   General: Tenderness (Upper lumbar paraspinous muscles bilaterally) present. Normal range of motion.  ?   Cervical back: Normal range of motion and neck supple.  ?Skin: ?   General: Skin is warm and dry.  ?   Findings: Lesion (There is a 1 cm pedunculated lesion at the midline approximately T11-T12.  This appears to be benign but has some associated inflammation mild erythema) present.  ?Neurological:  ?   Mental Status: He is alert and oriented to person, place, and time.  ?   Deep Tendon Reflexes: Reflexes are normal and symmetric.  ?Psychiatric:     ?   Behavior: Behavior normal.     ?   Thought Content: Thought content normal.     ?   Judgment: Judgment normal.  ? ? ? ? ?Assessment & Plan:  ? ?Bomani was seen today for new patient (initial visit). ? ?Diagnoses and all orders for this visit: ? ?Primary hypertension ?-     CBC with Differential/Platelet ?-     CMP14+EGFR ? ?Hyperlipidemia, unspecified hyperlipidemia type ?-     Lipid panel ? ?Encounter for vitamin deficiency screening ?-     VITAMIN D 25 Hydroxy (Vit-D Deficiency, Fractures) ? ?Lumbar degenerative disc disease ? ?Benign neoplasm of skin of trunk ? ?Other orders ?-     gabapentin (NEURONTIN) 300 MG capsule;  Take 1 capsule (300 mg total) by mouth 3 (three) times daily. ? ? ?Using sterile prep and drape the skin lesion mentioned above was removed with scissor technique.  Hemostasis with silver nitrate.  Band-Aid dressing applied.  Anesthesia was not used.  No complications noted no bleeding. ? ? ? ?I have changed Tige C. Giannone's gabapentin. I am also having him maintain his dorzolamide-timolol, latanoprost, metoprolol tartrate, furosemide, amLODipine, traZODone, potassium chloride, apixaban, lidocaine, lovastatin, losartan, and albuterol. ? ?Allergies as of 09/04/2021   ? ?   Reactions  ? Diltiazem Rash  ? ?  ? ?  ?Medication List  ?  ? ?  ? Accurate as  of Sep 04, 2021 11:22 AM. If you have any questions, ask your nurse or doctor.  ?  ?  ? ?  ? ?albuterol 108 (90 Base) MCG/ACT inhaler ?Commonly known as: VENTOLIN HFA ?Inhale 2 puffs into the lungs every 4 (four) hours as needed for wheezing or shortness of breath. ?  ?amLODipine 2.5 MG tablet ?Commonly known as: NORVASC ?Take 1 tablet (2.5 mg total) by mouth daily. ?  ?apixaban 5 MG Tabs tablet ?Commonly known as: ELIQUIS ?Take 5 mg by mouth 2 (two) times daily. ?  ?dorzolamide-timolol 22.3-6.8 MG/ML ophthalmic solution ?Commonly known as: COSOPT ?  ?furosemide 40 MG tablet ?Commonly known as: LASIX ?Take 1 tablet (40 mg total) by mouth daily. ?  ?gabapentin 300 MG capsule ?Commonly known as: NEURONTIN ?Take 1 capsule (300 mg total) by mouth 3 (three) times daily. ?What changed:  ?medication strength ?how much to take ?Changed by: Claretta Fraise, MD ?  ?latanoprost 0.005 % ophthalmic solution ?Commonly known as: XALATAN ?  ?lidocaine 5 % ?Commonly known as: LIDODERM ?Place 1 patch onto the skin daily. Remove & Discard patch within 12 hours or as directed by MD ?  ?losartan 100 MG tablet ?Commonly known as: COZAAR ?Take 1 tablet (100 mg total) by mouth daily. ?  ?lovastatin 40 MG tablet ?Commonly known as: MEVACOR ?Take 40 mg by mouth at bedtime. ?  ?metoprolol tartrate  50 MG tablet ?Commonly known as: LOPRESSOR ?TAKE ONE TABLET BY MOUTH TWICE DAILY ?  ?potassium chloride 10 MEQ tablet ?Commonly known as: KLOR-CON ?TAKE 1 TABLET DAILY ?  ?traZODone 50 MG tablet ?Commonly known as: DESYREL

## 2021-09-05 LAB — LIPID PANEL
Chol/HDL Ratio: 3.1 ratio (ref 0.0–5.0)
Cholesterol, Total: 119 mg/dL (ref 100–199)
HDL: 39 mg/dL — ABNORMAL LOW (ref >39)
LDL Chol Calc (NIH): 61 mg/dL (ref 0–99)
Triglycerides: 99 mg/dL (ref 0–149)
VLDL Cholesterol Cal: 19 mg/dL (ref 5–40)

## 2021-09-05 LAB — CMP14+EGFR
ALT: 13 IU/L (ref 0–44)
AST: 17 IU/L (ref 0–40)
Albumin/Globulin Ratio: 1.4 (ref 1.2–2.2)
Albumin: 3.9 g/dL (ref 3.6–4.6)
Alkaline Phosphatase: 62 IU/L (ref 44–121)
BUN/Creatinine Ratio: 16 (ref 10–24)
BUN: 17 mg/dL (ref 8–27)
Bilirubin Total: 0.5 mg/dL (ref 0.0–1.2)
CO2: 26 mmol/L (ref 20–29)
Calcium: 8.9 mg/dL (ref 8.6–10.2)
Chloride: 99 mmol/L (ref 96–106)
Creatinine, Ser: 1.07 mg/dL (ref 0.76–1.27)
Globulin, Total: 2.7 g/dL (ref 1.5–4.5)
Glucose: 108 mg/dL — ABNORMAL HIGH (ref 70–99)
Potassium: 4.5 mmol/L (ref 3.5–5.2)
Sodium: 141 mmol/L (ref 134–144)
Total Protein: 6.6 g/dL (ref 6.0–8.5)
eGFR: 68 mL/min/{1.73_m2} (ref >59)

## 2021-09-05 LAB — VITAMIN D 25 HYDROXY (VIT D DEFICIENCY, FRACTURES): Vit D, 25-Hydroxy: 49.5 ng/mL (ref 30.0–100.0)

## 2021-09-05 LAB — CBC WITH DIFFERENTIAL/PLATELET
Basophils Absolute: 0.1 10*3/uL (ref 0.0–0.2)
Basos: 1 %
EOS (ABSOLUTE): 0.2 10*3/uL (ref 0.0–0.4)
Eos: 2 %
Hematocrit: 40.2 % (ref 37.5–51.0)
Hemoglobin: 13.5 g/dL (ref 13.0–17.7)
Immature Grans (Abs): 0 10*3/uL (ref 0.0–0.1)
Immature Granulocytes: 0 %
Lymphocytes Absolute: 2.8 10*3/uL (ref 0.7–3.1)
Lymphs: 36 %
MCH: 31.8 pg (ref 26.6–33.0)
MCHC: 33.6 g/dL (ref 31.5–35.7)
MCV: 95 fL (ref 79–97)
Monocytes Absolute: 0.7 10*3/uL (ref 0.1–0.9)
Monocytes: 9 %
Neutrophils Absolute: 4.2 10*3/uL (ref 1.4–7.0)
Neutrophils: 52 %
Platelets: 209 10*3/uL (ref 150–450)
RBC: 4.25 x10E6/uL (ref 4.14–5.80)
RDW: 12.1 % (ref 11.6–15.4)
WBC: 7.9 10*3/uL (ref 3.4–10.8)

## 2021-09-05 NOTE — Progress Notes (Signed)
Hello Nolin,  Your lab result is normal and/or stable.Some minor variations that are not significant are commonly marked abnormal, but do not represent any medical problem for you.  Best regards, Razan Siler, M.D.

## 2021-09-26 ENCOUNTER — Ambulatory Visit (INDEPENDENT_AMBULATORY_CARE_PROVIDER_SITE_OTHER): Payer: Medicare PPO

## 2021-09-26 ENCOUNTER — Encounter: Payer: Self-pay | Admitting: Family Medicine

## 2021-09-26 ENCOUNTER — Ambulatory Visit (INDEPENDENT_AMBULATORY_CARE_PROVIDER_SITE_OTHER): Payer: Medicare PPO | Admitting: Family Medicine

## 2021-09-26 VITALS — BP 153/63 | HR 56 | Temp 98.0°F | Ht 70.5 in | Wt 232.6 lb

## 2021-09-26 DIAGNOSIS — M47816 Spondylosis without myelopathy or radiculopathy, lumbar region: Secondary | ICD-10-CM | POA: Diagnosis not present

## 2021-09-26 DIAGNOSIS — M545 Low back pain, unspecified: Secondary | ICD-10-CM | POA: Diagnosis not present

## 2021-09-26 DIAGNOSIS — G8929 Other chronic pain: Secondary | ICD-10-CM

## 2021-09-26 MED ORDER — GABAPENTIN 300 MG PO CAPS: 600.0000 mg | ORAL_CAPSULE | Freq: Two times a day (BID) | ORAL | 3 refills | Status: DC

## 2021-09-26 MED ORDER — PREDNISONE 10 MG PO TABS: ORAL_TABLET | ORAL | 0 refills | Status: DC

## 2021-09-26 MED ORDER — TIZANIDINE HCL 4 MG PO TABS: 4.0000 mg | ORAL_TABLET | Freq: Four times a day (QID) | ORAL | 1 refills | Status: DC | PRN

## 2021-09-26 NOTE — Progress Notes (Signed)
Subjective:  Patient ID: Jerry Fox, male    DOB: [birth year]  Age: 88 y.o. MRN: [id number]  CC: No chief complaint on file.   HPI Jerry Fox presents for Onset of back pain 3-4 days ago Onset he day after sex.  Couldn't turn at all to bathe. LOcated at midline lower back.  Hx of MRI showing degeneration of lumbar apine/ Taking gabapentin. Using 4 a day and 2 extra strength tylenol QID      09/26/2021   10:03 AM 09/04/2021   10:37 AM 06/27/2021    2:02 PM  Depression screen PHQ 2/9  Decreased Interest 0 0 0  Down, Depressed, Hopeless 0 0 0  PHQ - 2 Score 0 0 0    History Arby has a past medical history of AAA (abdominal aortic aneurysm) ([place]) (2/14), Arthritis, Atrial fibrillation ([place]) (2/14), Colon polyps, Hyperlipidemia, Hypertension, Murmur, cardiac, and Saddle pulmonary embolus ([place]) (2/14).   He has a past surgical history that includes Tonsillectomy.   His family history includes Arthritis in his father and mother; Heart disease in his father, mother, and paternal grandfather; Hypertension in his father.He reports that he quit smoking about 67 years ago. His smoking use included cigarettes. He has a 10.00 pack-year smoking history. He quit smokeless tobacco use about 57 years ago.  His smokeless tobacco use included chew. He reports that he does not drink alcohol. No history on file for drug use.    ROS Review of Systems  Constitutional:  Negative for fever.  Respiratory:  Negative for shortness of breath.   Cardiovascular:  Negative for chest pain.  Musculoskeletal:  Positive for arthralgias and back pain.  Skin:  Negative for rash.   Objective:  BP (!) 153/63   Pulse (!) 56   Temp 98 F (36.7 C)   Ht 5' 10.5" (1.791 m)   Wt 232 lb 9.6 oz (105.5 kg)   SpO2 96%   BMI 32.90 kg/m   BP Readings from Last 3 Encounters:  09/26/21 (!) 153/63  09/04/21 (!) 128/52  04/28/21 130/60    Wt Readings from Last 3 Encounters:  09/26/21 232 lb 9.6 oz (105.5  kg)  09/04/21 230 lb 6.4 oz (104.5 kg)  06/27/21 220 lb (99.8 kg)     Physical Exam Constitutional:      Appearance: Normal appearance.  HENT:     Head: Normocephalic.  Cardiovascular:     Rate and Rhythm: Normal rate and regular rhythm.  Pulmonary:     Effort: Pulmonary effort is normal.  Abdominal:     Palpations: Abdomen is soft.  Musculoskeletal:        General: No swelling or tenderness (Tenderness for palpation of the L4-5 musculature on the left.  Also tender for tapping of the spinal process of L4.).  Skin:    General: Skin is warm and dry.  Neurological:     Mental Status: He is alert.      Assessment & Plan:   Diagnoses and all orders for this visit:  Lumbar back pain -     Ambulatory referral to Physical Therapy -     DG Lumbar Spine 2-3 Views; Future  Chronic low back pain without sciatica, unspecified back pain laterality  Lumbar spondylosis  Other orders -     gabapentin (NEURONTIN) 300 MG capsule; Take 2 capsules (600 mg total) by mouth 2 (two) times daily. -     predniSONE (DELTASONE) 10 MG tablet; Take 5 daily for 2 days  followed by 4,3,2 and 1 for 2 days each. -     tiZANidine (ZANAFLEX) 4 MG tablet; Take 1 tablet (4 mg total) by mouth every 6 (six) hours as needed for muscle spasms.       I have changed Cuthbert C. Rozzell's gabapentin. I am also having him start on predniSONE and tiZANidine. Additionally, I am having him maintain his dorzolamide-timolol, latanoprost, metoprolol tartrate, furosemide, amLODipine, traZODone, potassium chloride, apixaban, lidocaine, lovastatin, losartan, and albuterol.  Allergies as of 09/26/2021       Reactions   Diltiazem Rash        Medication List        Accurate as of Sep 26, 2021 10:40 AM. If you have any questions, ask your nurse or doctor.          albuterol 108 (90 Base) MCG/ACT inhaler Commonly known as: VENTOLIN HFA Inhale 2 puffs into the lungs every 4 (four) hours as needed for wheezing  or shortness of breath.   amLODipine 2.5 MG tablet Commonly known as: NORVASC Take 1 tablet (2.5 mg total) by mouth daily.   apixaban 5 MG Tabs tablet Commonly known as: ELIQUIS Take 5 mg by mouth 2 (two) times daily.   dorzolamide-timolol 22.3-6.8 MG/ML ophthalmic solution Commonly known as: COSOPT   furosemide 40 MG tablet Commonly known as: LASIX Take 1 tablet (40 mg total) by mouth daily.   gabapentin 300 MG capsule Commonly known as: NEURONTIN Take 2 capsules (600 mg total) by mouth 2 (two) times daily. What changed:  how much to take when to take this Changed by: Claretta Fraise, MD   latanoprost 0.005 % ophthalmic solution Commonly known as: XALATAN   lidocaine 5 % Commonly known as: LIDODERM Place 1 patch onto the skin daily. Remove & Discard patch within 12 hours or as directed by MD   losartan 100 MG tablet Commonly known as: COZAAR Take 1 tablet (100 mg total) by mouth daily.   lovastatin 40 MG tablet Commonly known as: MEVACOR Take 40 mg by mouth at bedtime.   metoprolol tartrate 50 MG tablet Commonly known as: LOPRESSOR TAKE ONE TABLET BY MOUTH TWICE DAILY   potassium chloride 10 MEQ tablet Commonly known as: KLOR-CON TAKE 1 TABLET DAILY   predniSONE 10 MG tablet Commonly known as: DELTASONE Take 5 daily for 2 days followed by 4,3,2 and 1 for 2 days each. Started by: Claretta Fraise, MD   tiZANidine 4 MG tablet Commonly known as: ZANAFLEX Take 1 tablet (4 mg total) by mouth every 6 (six) hours as needed for muscle spasms. Started by: Claretta Fraise, MD   traZODone 50 MG tablet Commonly known as: DESYREL Take 1 tablet (50 mg total) by mouth at bedtime.         Follow-up: Return if symptoms worsen or fail to improve.  Claretta Fraise, M.D.

## 2021-09-27 ENCOUNTER — Other Ambulatory Visit: Payer: Self-pay | Admitting: Family Medicine

## 2021-09-27 DIAGNOSIS — S32010A Wedge compression fracture of first lumbar vertebra, initial encounter for closed fracture: Secondary | ICD-10-CM

## 2021-09-28 ENCOUNTER — Other Ambulatory Visit (INDEPENDENT_AMBULATORY_CARE_PROVIDER_SITE_OTHER): Payer: Medicare PPO

## 2021-09-28 ENCOUNTER — Other Ambulatory Visit: Payer: Self-pay | Admitting: Family Medicine

## 2021-09-28 ENCOUNTER — Telehealth: Payer: Self-pay | Admitting: Family Medicine

## 2021-09-28 DIAGNOSIS — I7143 Infrarenal abdominal aortic aneurysm, without rupture: Secondary | ICD-10-CM

## 2021-09-28 DIAGNOSIS — Z87312 Personal history of (healed) stress fracture: Secondary | ICD-10-CM

## 2021-09-28 DIAGNOSIS — S32010A Wedge compression fracture of first lumbar vertebra, initial encounter for closed fracture: Secondary | ICD-10-CM

## 2021-09-28 DIAGNOSIS — M85851 Other specified disorders of bone density and structure, right thigh: Secondary | ICD-10-CM | POA: Diagnosis not present

## 2021-09-28 NOTE — Telephone Encounter (Signed)
I ordered the referral to vascular

## 2021-10-06 ENCOUNTER — Telehealth: Payer: Self-pay

## 2021-10-06 ENCOUNTER — Ambulatory Visit: Payer: Medicare PPO | Admitting: Family Medicine

## 2021-10-06 NOTE — Telephone Encounter (Signed)
Pt's wife, Gay Filler, called stating that pt's possible future surgery had been approved. She stated that the pt's BP was running low, with readings 92/49 and 85/42. Referral to [facility] still in process. Since pt is not a current pt, she was advised to contact PCP regarding BP readings. Advised that she would be receiving a call from our office once referral completed to schedule appt with provider.

## 2021-10-09 ENCOUNTER — Encounter: Payer: Self-pay | Admitting: Family Medicine

## 2021-11-08 ENCOUNTER — Ambulatory Visit (INDEPENDENT_AMBULATORY_CARE_PROVIDER_SITE_OTHER): Payer: Medicare PPO | Admitting: Family Medicine

## 2021-11-08 ENCOUNTER — Encounter: Payer: Self-pay | Admitting: Family Medicine

## 2021-11-08 VITALS — BP 139/65 | HR 58 | Temp 97.3°F | Ht 70.5 in | Wt 234.0 lb

## 2021-11-08 DIAGNOSIS — L821 Other seborrheic keratosis: Secondary | ICD-10-CM

## 2021-11-08 NOTE — Progress Notes (Signed)
Chief Complaint  Patient presents with   Skin Problem    HPI  Patient presents today for dark spot on left shoulder near where a skin cancer was removed in the past.  PMH: Smoking status noted ROS: Per HPI  Objective: BP 139/65   Pulse (!) 58   Temp (!) 97.3 F (36.3 C)   Ht 5' 10.5" (1.791 m)   Wt 234 lb (106.1 kg)   SpO2 96%   BMI 33.10 kg/m  Gen: NAD, alert, cooperative with exam CV: RRR, good S1/S2, no murmur Skin: multiple seborrheic keratosis on upper back. There are no suspicious lesions oted.  Ext: No edema, warm Neuro: Alert and oriented, No gross deficits  Assessment and plan:  1. Seborrheic keratosis     Pt. Reassured of benign nature of lesions  Follow up as needed.  Claretta Fraise, MD

## 2021-11-09 ENCOUNTER — Other Ambulatory Visit: Payer: Self-pay | Admitting: *Deleted

## 2021-11-09 DIAGNOSIS — I714 Abdominal aortic aneurysm, without rupture, unspecified: Secondary | ICD-10-CM

## 2021-11-15 ENCOUNTER — Encounter: Payer: Self-pay | Admitting: Vascular Surgery

## 2021-11-15 ENCOUNTER — Ambulatory Visit (INDEPENDENT_AMBULATORY_CARE_PROVIDER_SITE_OTHER): Payer: No Typology Code available for payment source

## 2021-11-15 ENCOUNTER — Ambulatory Visit (INDEPENDENT_AMBULATORY_CARE_PROVIDER_SITE_OTHER): Payer: Self-pay | Admitting: Vascular Surgery

## 2021-11-15 VITALS — BP 193/73 | HR 47 | Temp 97.4°F | Resp 18 | Ht 70.5 in | Wt 234.0 lb

## 2021-11-15 DIAGNOSIS — I714 Abdominal aortic aneurysm, without rupture, unspecified: Secondary | ICD-10-CM

## 2021-11-15 NOTE — Progress Notes (Signed)
[facility] [place]  Patient name: Jerry Fox MRN: [id number] DOB: Apr 16, 1934 Sex: male  REASON FOR CONSULT: Discussion of abdominal aortic aneurysm  HPI: Jerry Fox is a 88 y.o. male, who is here today for discussion of abdominal aortic aneurysm diagnosis.  He is here today with his wife.  He recently had plain lumbar films to evaluate back pain.  He had visualization of significant abdominal aortic aneurysm.  He is here today for duplex and further discussion.  He does not recall prior diagnosis of this but his wife feels like she knows of discussion in the past.  There is an old ultrasound from 2017 suggesting a 3.7 cm aneurysm at that time.  He does have a history of major pulmonary embolus in 2014.  He has chronic lower extremity swelling.  Has a history of chronic atrial fibrillation as well.  Past Medical History:  Diagnosis Date   AAA (abdominal aortic aneurysm) ([place]) 2/14   3.8 cm 2/14   Arthritis    Atrial fibrillation ([place]) 2/14   acute pulmonary embolus   Colon polyps    Hyperlipidemia    Hypertension    Murmur, cardiac    Saddle pulmonary embolus ([place]) 2/14   LLE DVT    Family History  Problem Relation Age of Onset   Arthritis Mother    Heart disease Mother    Arthritis Father    Heart disease Father    Hypertension Father    Heart disease Paternal Grandfather     SOCIAL HISTORY: Social History   Socioeconomic History   Marital status: Married    Spouse name: Not on file   Number of children: 3   Years of education: Not on file   Highest education level: Not on file  Occupational History   Not on file  Tobacco Use   Smoking status: Former    Packs/day: 1.00    Years: 10.00    Total pack years: 10.00    Types: Cigarettes    Quit date: 01/31/1954    Years since quitting: 67.8   Smokeless tobacco: Former    Types: Chew    Quit date: 02/01/1964  Vaping Use   Vaping Use: Never used   Substance and Sexual Activity   Alcohol use: No   Drug use: Not on file   Sexual activity: Not on file  Other Topics Concern   Not on file  Social History Narrative   Married & retired   3 sons + 5 grandchildren   Social Determinants of Health   Financial Resource Strain: Low Risk  (06/27/2021)   Overall Financial Resource Strain (CARDIA)    Difficulty of Paying Living Expenses: Not hard at all  Food Insecurity: No Food Insecurity (06/27/2021)   Hunger Vital Sign    Worried About Running Out of Food in the Last Year: Never true    Ran Out of Food in the Last Year: Never true  Transportation Needs: No Transportation Needs (06/27/2021)   PRAPARE - Hydrologist (Medical): No    Lack of Transportation (Non-Medical): No  Physical Activity: Inactive (06/27/2021)   Exercise Vital Sign    Days of Exercise per Week: 0 days    Minutes of Exercise per Session: 0 min  Stress: No Stress Concern Present (06/27/2021)   [place]    Feeling of Stress : Not at all  Social Connections: Unknown (06/27/2021)  Social Licensed conveyancer [NHANES]    Frequency of Communication with Friends and Family: More than three times a week    Frequency of Social Gatherings with Friends and Family: More than three times a week    Attends Religious Services: Not on file    Active Member of Clubs or Organizations: Yes    Attends Archivist Meetings: More than 4 times per year    Marital Status: Married  Human resources officer Violence: Not At Risk (06/27/2021)   Humiliation, Afraid, Rape, and Kick questionnaire    Fear of Current or Ex-Partner: No    Emotionally Abused: No    Physically Abused: No    Sexually Abused: No    Allergies  Allergen Reactions   Diltiazem Rash    Current Outpatient Medications  Medication Sig Dispense Refill   albuterol (VENTOLIN HFA) 108 (90 Base) MCG/ACT inhaler  Inhale 2 puffs into the lungs every 4 (four) hours as needed for wheezing or shortness of breath. 1 each 0   amLODipine (NORVASC) 2.5 MG tablet Take 1 tablet (2.5 mg total) by mouth daily. 90 tablet 3   apixaban (ELIQUIS) 5 MG TABS tablet Take 5 mg by mouth 2 (two) times daily.     dorzolamide-timolol (COSOPT) 22.3-6.8 MG/ML ophthalmic solution      furosemide (LASIX) 40 MG tablet Take 1 tablet (40 mg total) by mouth daily. 90 tablet 3   gabapentin (NEURONTIN) 300 MG capsule Take 2 capsules (600 mg total) by mouth 2 (two) times daily. 360 capsule 3   latanoprost (XALATAN) 0.005 % ophthalmic solution      lidocaine (LIDODERM) 5 % Place 1 patch onto the skin daily. Remove & Discard patch within 12 hours or as directed by MD     losartan (COZAAR) 100 MG tablet Take 1 tablet (100 mg total) by mouth daily. 90 tablet 3   lovastatin (MEVACOR) 40 MG tablet Take 40 mg by mouth at bedtime.     metoprolol (LOPRESSOR) 50 MG tablet TAKE ONE TABLET BY MOUTH TWICE DAILY 180 tablet 1   potassium chloride (K-DUR) 10 MEQ tablet TAKE 1 TABLET DAILY 90 tablet 0   predniSONE (DELTASONE) 10 MG tablet Take 5 daily for 2 days followed by 4,3,2 and 1 for 2 days each. 30 tablet 0   tiZANidine (ZANAFLEX) 4 MG tablet Take 1 tablet (4 mg total) by mouth every 6 (six) hours as needed for muscle spasms. 30 tablet 1   traZODone (DESYREL) 50 MG tablet Take 1 tablet (50 mg total) by mouth at bedtime. 90 tablet 3   No current facility-administered medications for this visit.    REVIEW OF SYSTEMS:  '[X]'$  denotes positive finding, '[ ]'$  denotes negative finding Cardiac  Comments:  Chest pain or chest pressure:    Shortness of breath upon exertion:    Short of breath when lying flat:    Irregular heart rhythm: x       Vascular    Pain in calf, thigh, or hip brought on by ambulation:    Pain in feet at night that wakes you up from your sleep:     Blood clot in your veins:    Leg swelling:  x       Pulmonary    Oxygen at  home:    Productive cough:     Wheezing:         Neurologic    Sudden weakness in arms or legs:     Sudden numbness in  arms or legs:     Sudden onset of difficulty speaking or slurred speech:    Temporary loss of vision in one eye:     Problems with dizziness:         Gastrointestinal    Blood in stool:     Vomited blood:         Genitourinary    Burning when urinating:     Blood in urine:        Psychiatric    Major depression:         Hematologic    Bleeding problems:    Problems with blood clotting too easily:        Skin    Rashes or ulcers:        Constitutional    Fever or chills:      PHYSICAL EXAM: Vitals:   11/15/21 0932  BP: (!) 193/73  Pulse: (!) 47  Resp: 18  Temp: (!) 97.4 F (36.3 C)  TempSrc: Temporal  SpO2: 96%  Weight: 234 lb (106.1 kg)  Height: 5' 10.5" (1.791 m)    GENERAL: The patient is a well-nourished male, in no acute distress. The vital signs are documented above. CARDIOVASCULAR: 2+ radial pulses bilaterally.  2+ popliteal pulses bilaterally.  I do not palpate pedal pulses.  He has marked pitting edema bilaterally. PULMONARY: There is good air exchange  MUSCULOSKELETAL: There are no major deformities or cyanosis. NEUROLOGIC: No focal weakness or paresthesias are detected. SKIN: There are no ulcers or rashes noted. PSYCHIATRIC: The patient has a normal affect.  DATA:  Plain lumbar views show calcified aneurysm with measurement of 5.4 cm.  This is typically known to be overestimate on plain films  Duplex today was somewhat technically difficult due to his size.  He does appear to have approximately 5 cm abdominal aortic aneurysm.  MEDICAL ISSUES: Had long discussion with the patient and his wife.  I explained the significance of his asymptomatic aneurysm.  I feel comfortable that he is below the usual threshold of elective repair at 5.5 cm.  He understands the importance of routine blood pressure control.  Have recommended that we  see him again in 6 months with CT of his abdomen and pelvis at that time for follow-up and better definition of his aneurysm morphology.  Did discuss symptoms of leaking aneurysm and he knows to report immediately to the emergency room should this occur   Rosetta Posner, MD [facility] [phone] Pager [phone]  Note: Portions of this report may have been transcribed using voice recognition software.  Every effort has been made to ensure accuracy; however, inadvertent computerized transcription errors may still be present.

## 2021-12-06 ENCOUNTER — Ambulatory Visit: Payer: Medicare PPO | Admitting: Family Medicine

## 2021-12-07 ENCOUNTER — Encounter: Payer: Self-pay | Admitting: Family Medicine

## 2021-12-07 ENCOUNTER — Ambulatory Visit: Payer: Medicare PPO | Admitting: Family Medicine

## 2021-12-07 VITALS — BP 187/66 | HR 53 | Temp 97.4°F | Ht 70.25 in | Wt 236.2 lb

## 2021-12-07 DIAGNOSIS — I10 Essential (primary) hypertension: Secondary | ICD-10-CM | POA: Diagnosis not present

## 2021-12-07 DIAGNOSIS — I480 Paroxysmal atrial fibrillation: Secondary | ICD-10-CM

## 2021-12-07 MED ORDER — METOPROLOL TARTRATE 50 MG PO TABS: 25.0000 mg | ORAL_TABLET | Freq: Two times a day (BID) | ORAL | 1 refills | Status: DC

## 2021-12-07 MED ORDER — AZITHROMYCIN 250 MG PO TABS: ORAL_TABLET | ORAL | 0 refills | Status: DC

## 2021-12-07 MED ORDER — OLMESARTAN MEDOXOMIL 40 MG PO TABS: 40.0000 mg | ORAL_TABLET | Freq: Every day | ORAL | 1 refills | Status: DC

## 2021-12-07 NOTE — Progress Notes (Signed)
Subjective:  Patient ID: Jerry Fox, male    DOB: Sep 15, 1933  Age: 88 y.o. MRN: [id number]  CC: Medical Management of Chronic Issues   HPI Jerry Fox presents for elevated blood pressure. Multiple home readings are in the 595 range systolic. Concerned about heart rate going as low as 44.  Atrial fibrillation follow up. Pt. is treated with rate control and anticoagulation. Pt.  denies palpitations, rapid rate, chest pain, dyspnea and edema. There has been no bleeding from nose or gums. Pt. has not noticed blood with urine or stool.  Although there is routine bruising easily, it is not excessive.   Wheezing started 2 days ago. Prone to going into bronchitis. Has albuterol inhaler. No cough yet.      12/07/2021   10:29 AM 11/08/2021   10:53 AM 09/26/2021   10:03 AM  Depression screen PHQ 2/9  Decreased Interest 0 0 0  Down, Depressed, Hopeless 0 0 0  PHQ - 2 Score 0 0 0    History Jerry Fox has a past medical history of AAA (abdominal aortic aneurysm) ([place]) (2/14), Arthritis, Atrial fibrillation ([place]) (2/14), Colon polyps, Hyperlipidemia, Hypertension, Murmur, cardiac, and Saddle pulmonary embolus ([place]) (2/14).   Jerry Fox has a past surgical history that includes Tonsillectomy.   His family history includes Arthritis in his father and mother; Heart disease in his father, mother, and paternal grandfather; Hypertension in his father.Jerry Fox reports that Jerry Fox quit smoking about 67 years ago. His smoking use included cigarettes. Jerry Fox has a 10.00 pack-year smoking history. Jerry Fox quit smokeless tobacco use about 57 years ago.  His smokeless tobacco use included chew. Jerry Fox reports that Jerry Fox does not drink alcohol. No history on file for drug use.    ROS Review of Systems  Constitutional:  Negative for fever.  Respiratory:  Negative for shortness of breath.   Cardiovascular:  Negative for chest pain.  Musculoskeletal:  Negative for arthralgias.  Skin:  Negative for rash.    Objective:  BP (!)  187/66   Pulse (!) 53   Temp (!) 97.4 F (36.3 C)   Ht 5' 10.25" (1.784 m)   Wt 236 lb 3.2 oz (107.1 kg)   SpO2 94%   BMI 33.65 kg/m   BP Readings from Last 3 Encounters:  12/07/21 (!) 187/66  11/15/21 (!) 193/73  11/08/21 139/65    Wt Readings from Last 3 Encounters:  12/07/21 236 lb 3.2 oz (107.1 kg)  11/15/21 234 lb (106.1 kg)  11/08/21 234 lb (106.1 kg)     Physical Exam Vitals reviewed.  Constitutional:      Appearance: Jerry Fox is well-developed.  HENT:     Head: Normocephalic and atraumatic.     Right Ear: External ear normal.     Left Ear: External ear normal.     Mouth/Throat:     Pharynx: No oropharyngeal exudate or posterior oropharyngeal erythema.  Eyes:     Pupils: Pupils are equal, round, and reactive to light.  Cardiovascular:     Rate and Rhythm: Normal rate and regular rhythm.     Heart sounds: No murmur heard. Pulmonary:     Effort: No respiratory distress.     Breath sounds: Normal breath sounds.  Musculoskeletal:     Cervical back: Normal range of motion and neck supple.  Neurological:     Mental Status: Jerry Fox is alert and oriented to person, place, and time.       Assessment & Plan:   Jerry Fox was seen today for  medical management of chronic issues.  Diagnoses and all orders for this visit:  Accelerated hypertension  Intermittent atrial fibrillation ([place])  Other orders -     olmesartan (BENICAR) 40 MG tablet; Take 1 tablet (40 mg total) by mouth daily. For blood pressure -     metoprolol tartrate (LOPRESSOR) 50 MG tablet; Take 0.5 tablets (25 mg total) by mouth 2 (two) times daily. -     azithromycin (ZITHROMAX Z-PAK) 250 MG tablet; Take two right away Then one a day for the next 4 days.       I have discontinued Otho Bellows. Vivar's losartan and predniSONE. I have also changed his metoprolol tartrate. Additionally, I am having him start on olmesartan and azithromycin. Lastly, I am having him maintain his dorzolamide-timolol,  latanoprost, furosemide, amLODipine, traZODone, potassium chloride, apixaban, lidocaine, lovastatin, albuterol, gabapentin, and tiZANidine.  Allergies as of 12/07/2021       Reactions   Diltiazem Rash        Medication List        Accurate as of December 07, 2021 11:07 AM. If you have any questions, ask your nurse or doctor.          STOP taking these medications    losartan 100 MG tablet Commonly known as: COZAAR Stopped by: Claretta Fraise, MD   predniSONE 10 MG tablet Commonly known as: DELTASONE Stopped by: Claretta Fraise, MD       TAKE these medications    albuterol 108 (90 Base) MCG/ACT inhaler Commonly known as: VENTOLIN HFA Inhale 2 puffs into the lungs every 4 (four) hours as needed for wheezing or shortness of breath.   amLODipine 2.5 MG tablet Commonly known as: NORVASC Take 1 tablet (2.5 mg total) by mouth daily.   apixaban 5 MG Tabs tablet Commonly known as: ELIQUIS Take 5 mg by mouth 2 (two) times daily.   azithromycin 250 MG tablet Commonly known as: Zithromax Z-Pak Take two right away Then one a day for the next 4 days. Started by: Claretta Fraise, MD   dorzolamide-timolol 22.3-6.8 MG/ML ophthalmic solution Commonly known as: COSOPT   furosemide 40 MG tablet Commonly known as: LASIX Take 1 tablet (40 mg total) by mouth daily.   gabapentin 300 MG capsule Commonly known as: NEURONTIN Take 2 capsules (600 mg total) by mouth 2 (two) times daily.   latanoprost 0.005 % ophthalmic solution Commonly known as: XALATAN   lidocaine 5 % Commonly known as: LIDODERM Place 1 patch onto the skin daily. Remove & Discard patch within 12 hours or as directed by MD   lovastatin 40 MG tablet Commonly known as: MEVACOR Take 40 mg by mouth at bedtime.   metoprolol tartrate 50 MG tablet Commonly known as: LOPRESSOR Take 0.5 tablets (25 mg total) by mouth 2 (two) times daily. What changed: how much to take Changed by: Claretta Fraise, MD   olmesartan 40 MG  tablet Commonly known as: Benicar Take 1 tablet (40 mg total) by mouth daily. For blood pressure Started by: Claretta Fraise, MD   potassium chloride 10 MEQ tablet Commonly known as: KLOR-CON TAKE 1 TABLET DAILY   tiZANidine 4 MG tablet Commonly known as: ZANAFLEX Take 1 tablet (4 mg total) by mouth every 6 (six) hours as needed for muscle spasms.   traZODone 50 MG tablet Commonly known as: DESYREL Take 1 tablet (50 mg total) by mouth at bedtime.         Follow-up: Return in about 1 month (around 01/07/2022).  Cletus Gash  Cassadee Vanzandt, M.D.

## 2021-12-08 ENCOUNTER — Other Ambulatory Visit: Payer: Self-pay

## 2021-12-08 ENCOUNTER — Telehealth: Payer: Self-pay | Admitting: Family Medicine

## 2021-12-08 MED ORDER — OLMESARTAN MEDOXOMIL 40 MG PO TABS: 40.0000 mg | ORAL_TABLET | Freq: Every day | ORAL | 1 refills | Status: DC

## 2021-12-08 NOTE — Telephone Encounter (Signed)
  Prescription Request  12/08/2021  Is this a "Controlled Substance" medicine? no  Have you seen your PCP in the last 2 weeks? yes  If YES, route message to pool  -  If NO, patient needs to be scheduled for appointment.  What is the name of the medication or equipment? olmesartan (BENICAR) 40 MG tablet  Have you contacted your pharmacy to request a refill? Yes[facility]    Which [facility] would you like this sent to? [facility]    Patient notified that their request is being sent to the clinical staff for review and that they should receive a response within 2 business days.

## 2021-12-08 NOTE — Telephone Encounter (Signed)
Olmesartan sent to [facility] due to [facility] being out of med. Wife notified and verbalized understanding

## 2021-12-11 ENCOUNTER — Other Ambulatory Visit: Payer: Self-pay | Admitting: Family Medicine

## 2021-12-11 ENCOUNTER — Telehealth: Payer: Self-pay

## 2021-12-11 MED ORDER — VALSARTAN 160 MG PO TABS: 160.0000 mg | ORAL_TABLET | Freq: Every day | ORAL | 2 refills | Status: DC

## 2021-12-11 NOTE — Progress Notes (Deleted)
Substituted valsartan for olmesartan which was not covered by insurance.

## 2021-12-11 NOTE — Telephone Encounter (Signed)
Please let the patient know that I sent their prescription to their pharmacy. Thanks, WS 

## 2021-12-11 NOTE — Telephone Encounter (Signed)
Olmesartan is not covered by patient's insurance.  Covered alternatives are:  Candesartan Losartan Telmisartan Valsartan

## 2022-01-08 ENCOUNTER — Encounter: Payer: Self-pay | Admitting: Family Medicine

## 2022-01-08 ENCOUNTER — Ambulatory Visit (INDEPENDENT_AMBULATORY_CARE_PROVIDER_SITE_OTHER): Payer: Medicare PPO | Admitting: Family Medicine

## 2022-01-08 VITALS — BP 140/61 | HR 65 | Temp 97.8°F | Ht 70.25 in | Wt 233.0 lb

## 2022-01-08 DIAGNOSIS — I10 Essential (primary) hypertension: Secondary | ICD-10-CM | POA: Diagnosis not present

## 2022-01-08 MED ORDER — AMLODIPINE BESYLATE-VALSARTAN 5-320 MG PO TABS: 1.0000 | ORAL_TABLET | Freq: Every day | ORAL | 0 refills | Status: DC

## 2022-01-08 NOTE — Progress Notes (Signed)
Subjective:  Patient ID: Jerry Fox, male    DOB: 1934/02/14  Age: 88 y.o. MRN: [id number]  CC: Follow-up   HPI LORING LISKEY presents for  follow-up of hypertension. Patient has no history of headache chest pain or shortness of breath or recent cough. Patient also denies symptoms of TIA such as focal numbness or weakness. Patient denies side effects from medication. States taking it regularly.   History Sophie has a past medical history of AAA (abdominal aortic aneurysm) ([place]) (2/14), Arthritis, Atrial fibrillation ([place]) (2/14), Colon polyps, Hyperlipidemia, Hypertension, Murmur, cardiac, and Saddle pulmonary embolus ([place]) (2/14).   He has a past surgical history that includes Tonsillectomy.   His family history includes Arthritis in his father and mother; Heart disease in his father, mother, and paternal grandfather; Hypertension in his father.He reports that he quit smoking about 67 years ago. His smoking use included cigarettes. He has a 10.00 pack-year smoking history. He quit smokeless tobacco use about 57 years ago.  His smokeless tobacco use included chew. He reports that he does not drink alcohol. No history on file for drug use.  Current Outpatient Medications on File Prior to Visit  Medication Sig Dispense Refill   albuterol (VENTOLIN HFA) 108 (90 Base) MCG/ACT inhaler Inhale 2 puffs into the lungs every 4 (four) hours as needed for wheezing or shortness of breath. 1 each 0   apixaban (ELIQUIS) 5 MG TABS tablet Take 5 mg by mouth 2 (two) times daily.     azithromycin (ZITHROMAX Z-PAK) 250 MG tablet Take two right away Then one a day for the next 4 days. 6 each 0   dorzolamide-timolol (COSOPT) 22.3-6.8 MG/ML ophthalmic solution      furosemide (LASIX) 40 MG tablet Take 1 tablet (40 mg total) by mouth daily. 90 tablet 3   gabapentin (NEURONTIN) 300 MG capsule Take 2 capsules (600 mg total) by mouth 2 (two) times daily. 360 capsule 3   latanoprost (XALATAN) 0.005 %  ophthalmic solution      lidocaine (LIDODERM) 5 % Place 1 patch onto the skin daily. Remove & Discard patch within 12 hours or as directed by MD     lovastatin (MEVACOR) 40 MG tablet Take 40 mg by mouth at bedtime.     metoprolol tartrate (LOPRESSOR) 50 MG tablet Take 0.5 tablets (25 mg total) by mouth 2 (two) times daily. 180 tablet 1   potassium chloride (K-DUR) 10 MEQ tablet TAKE 1 TABLET DAILY 90 tablet 0   tiZANidine (ZANAFLEX) 4 MG tablet Take 1 tablet (4 mg total) by mouth every 6 (six) hours as needed for muscle spasms. 30 tablet 1   traZODone (DESYREL) 50 MG tablet Take 1 tablet (50 mg total) by mouth at bedtime. 90 tablet 3   No current facility-administered medications on file prior to visit.    ROS Review of Systems  Constitutional:  Negative for fever.  Respiratory:  Negative for shortness of breath.   Cardiovascular:  Negative for chest pain.  Musculoskeletal:  Negative for arthralgias.  Skin:  Negative for rash.    Objective:  BP (!) 140/61   Pulse 65   Temp 97.8 F (36.6 C)   Ht 5' 10.25" (1.784 m)   Wt 233 lb (105.7 kg)   SpO2 95%   BMI 33.19 kg/m   BP Readings from Last 3 Encounters:  01/08/22 (!) 140/61  12/07/21 (!) 187/66  11/15/21 (!) 193/73    Wt Readings from Last 3 Encounters:  01/08/22 233 lb (  105.7 kg)  12/07/21 236 lb 3.2 oz (107.1 kg)  11/15/21 234 lb (106.1 kg)     Physical Exam Vitals reviewed.  Constitutional:      Appearance: He is well-developed.  HENT:     Head: Normocephalic and atraumatic.     Right Ear: External ear normal.     Left Ear: External ear normal.     Mouth/Throat:     Pharynx: No oropharyngeal exudate or posterior oropharyngeal erythema.  Eyes:     Pupils: Pupils are equal, round, and reactive to light.  Cardiovascular:     Rate and Rhythm: Normal rate and regular rhythm.     Heart sounds: No murmur heard. Pulmonary:     Effort: No respiratory distress.     Breath sounds: Normal breath sounds.   Musculoskeletal:     Cervical back: Normal range of motion and neck supple.  Neurological:     Mental Status: He is alert and oriented to person, place, and time.       Assessment & Plan:   There are no diagnoses linked to this encounter. Allergies as of 01/08/2022       Reactions   Diltiazem Rash        Medication List        Accurate as of January 08, 2022 11:47 AM. If you have any questions, ask your nurse or doctor.          STOP taking these medications    amLODipine 2.5 MG tablet Commonly known as: NORVASC Stopped by: Claretta Fraise, MD   valsartan 160 MG tablet Commonly known as: DIOVAN Stopped by: Claretta Fraise, MD       TAKE these medications    albuterol 108 (90 Base) MCG/ACT inhaler Commonly known as: VENTOLIN HFA Inhale 2 puffs into the lungs every 4 (four) hours as needed for wheezing or shortness of breath.   amLODipine-valsartan 5-320 MG tablet Commonly known as: Exforge Take 1 tablet by mouth daily. Started by: Claretta Fraise, MD   apixaban 5 MG Tabs tablet Commonly known as: ELIQUIS Take 5 mg by mouth 2 (two) times daily.   azithromycin 250 MG tablet Commonly known as: Zithromax Z-Pak Take two right away Then one a day for the next 4 days.   dorzolamide-timolol 22.3-6.8 MG/ML ophthalmic solution Commonly known as: COSOPT   furosemide 40 MG tablet Commonly known as: LASIX Take 1 tablet (40 mg total) by mouth daily.   gabapentin 300 MG capsule Commonly known as: NEURONTIN Take 2 capsules (600 mg total) by mouth 2 (two) times daily.   latanoprost 0.005 % ophthalmic solution Commonly known as: XALATAN   lidocaine 5 % Commonly known as: LIDODERM Place 1 patch onto the skin daily. Remove & Discard patch within 12 hours or as directed by MD   lovastatin 40 MG tablet Commonly known as: MEVACOR Take 40 mg by mouth at bedtime.   metoprolol tartrate 50 MG tablet Commonly known as: LOPRESSOR Take 0.5 tablets (25 mg total) by  mouth 2 (two) times daily.   potassium chloride 10 MEQ tablet Commonly known as: KLOR-CON TAKE 1 TABLET DAILY   tiZANidine 4 MG tablet Commonly known as: ZANAFLEX Take 1 tablet (4 mg total) by mouth every 6 (six) hours as needed for muscle spasms.   traZODone 50 MG tablet Commonly known as: DESYREL Take 1 tablet (50 mg total) by mouth at bedtime.        Meds ordered this encounter  Medications   amLODipine-valsartan (EXFORGE) 5-320  MG tablet    Sig: Take 1 tablet by mouth daily.    Dispense:  90 tablet    Refill:  0      Follow-up: Return in about 3 months (around 04/09/2022).  Claretta Fraise, M.D.

## 2022-03-29 ENCOUNTER — Other Ambulatory Visit: Payer: Self-pay | Admitting: Family Medicine

## 2022-04-09 ENCOUNTER — Encounter: Payer: Self-pay | Admitting: Family Medicine

## 2022-04-09 ENCOUNTER — Ambulatory Visit: Payer: Medicare PPO | Admitting: Family Medicine

## 2022-04-16 ENCOUNTER — Other Ambulatory Visit: Payer: Self-pay

## 2022-04-16 DIAGNOSIS — I714 Abdominal aortic aneurysm, without rupture, unspecified: Secondary | ICD-10-CM

## 2022-05-02 ENCOUNTER — Other Ambulatory Visit: Payer: Self-pay

## 2022-05-02 DIAGNOSIS — I714 Abdominal aortic aneurysm, without rupture, unspecified: Secondary | ICD-10-CM

## 2022-05-21 ENCOUNTER — Ambulatory Visit (HOSPITAL_COMMUNITY)
Admission: RE | Admit: 2022-05-21 | Discharge: 2022-05-21 | Disposition: A | Discharge status: Home / Self Care | Payer: No Typology Code available for payment source | Source: Ambulatory Visit | Attending: Vascular Surgery | Admitting: Vascular Surgery

## 2022-05-21 ENCOUNTER — Other Ambulatory Visit (HOSPITAL_COMMUNITY): Payer: No Typology Code available for payment source

## 2022-05-21 DIAGNOSIS — I714 Abdominal aortic aneurysm, without rupture, unspecified: Secondary | ICD-10-CM | POA: Diagnosis not present

## 2022-05-21 MED ORDER — IOHEXOL 350 MG/ML SOLN
100.0000 mL | Freq: Once | INTRAVENOUS | Status: AC | PRN
  Administered 2022-05-21: 100 mL via INTRAVENOUS

## 2022-05-23 ENCOUNTER — Encounter: Payer: Self-pay | Admitting: Vascular Surgery

## 2022-05-23 ENCOUNTER — Ambulatory Visit: Payer: Medicare PPO | Admitting: Family Medicine

## 2022-05-23 ENCOUNTER — Ambulatory Visit (INDEPENDENT_AMBULATORY_CARE_PROVIDER_SITE_OTHER): Payer: No Typology Code available for payment source | Admitting: Vascular Surgery

## 2022-05-23 VITALS — BP 144/76 | HR 63 | Temp 97.5°F | Ht 70.25 in | Wt 240.4 lb

## 2022-05-23 DIAGNOSIS — I714 Abdominal aortic aneurysm, without rupture, unspecified: Secondary | ICD-10-CM

## 2022-05-23 NOTE — Progress Notes (Signed)
[facility]  Patient name: Jerry Fox MRN: [id number] DOB: February 25, 1934 Sex: male  REASON FOR VISIT: Follow-up known infrarenal abdominal aortic aneurysm  HPI: ELDO UMANZOR is a 89 y.o. male here today for follow-up of infrarenal abdominal aortic aneurysm.  He is here today with his wife.  He continues to be in stable health.  He has had increasing lower extremity swelling and I points out a rash she has on the medial aspect of his left ankle.  Past Medical History:  Diagnosis Date   AAA (abdominal aortic aneurysm) ([place]) 2/14   3.8 cm 2/14   Arthritis    Atrial fibrillation ([place]) 2/14   acute pulmonary embolus   Colon polyps    Hyperlipidemia    Hypertension    Murmur, cardiac    Saddle pulmonary embolus ([place]) 2/14   LLE DVT    Family History  Problem Relation Age of Onset   Arthritis Mother    Heart disease Mother    Arthritis Father    Heart disease Father    Hypertension Father    Congestive Heart Failure Sister    Congestive Heart Failure Sister    Stroke Brother    Congestive Heart Failure Brother    Heart disease Paternal Grandfather     SOCIAL HISTORY: Social History   Tobacco Use   Smoking status: Former    Packs/day: 1.00    Years: 10.00    Total pack years: 10.00    Types: Cigarettes    Quit date: 01/31/1954    Years since quitting: 68.3   Smokeless tobacco: Former    Types: Chew    Quit date: 02/01/1964  Substance Use Topics   Alcohol use: No    Allergies  Allergen Reactions   Diltiazem Rash    Current Outpatient Medications  Medication Sig Dispense Refill   albuterol (VENTOLIN HFA) 108 (90 Base) MCG/ACT inhaler Inhale 2 puffs into the lungs every 4 (four) hours as needed for wheezing or shortness of breath. 1 each 0   amLODipine-valsartan (EXFORGE) 5-320 MG tablet TAKE ONE TABLET ONCE DAILY 90 tablet 0   apixaban (ELIQUIS) 5 MG TABS tablet Take 5 mg by mouth 2  (two) times daily.     azithromycin (ZITHROMAX Z-PAK) 250 MG tablet Take two right away Then one a day for the next 4 days. 6 each 0   dorzolamide-timolol (COSOPT) 22.3-6.8 MG/ML ophthalmic solution      furosemide (LASIX) 40 MG tablet Take 1 tablet (40 mg total) by mouth daily. 90 tablet 3   gabapentin (NEURONTIN) 300 MG capsule Take 2 capsules (600 mg total) by mouth 2 (two) times daily. 360 capsule 3   latanoprost (XALATAN) 0.005 % ophthalmic solution      lidocaine (LIDODERM) 5 % Place 1 patch onto the skin daily. Remove & Discard patch within 12 hours or as directed by MD     lovastatin (MEVACOR) 40 MG tablet Take 40 mg by mouth at bedtime.     metoprolol tartrate (LOPRESSOR) 50 MG tablet Take 0.5 tablets (25 mg total) by mouth 2 (two) times daily. 180 tablet 1   potassium chloride (K-DUR) 10 MEQ tablet TAKE 1 TABLET DAILY 90 tablet 0   tiZANidine (ZANAFLEX) 4 MG tablet Take 1 tablet (4 mg total) by mouth every 6 (six) hours as needed for muscle spasms. 30 tablet 1   traZODone (DESYREL) 50 MG tablet Take 1 tablet (50 mg total) by mouth at bedtime.  90 tablet 3   No current facility-administered medications for this visit.    REVIEW OF SYSTEMS:  '[X]'$  denotes positive finding, '[ ]'$  denotes negative finding Cardiac  Comments:  Chest pain or chest pressure:    Shortness of breath upon exertion:    Short of breath when lying flat:    Irregular heart rhythm:        Vascular    Pain in calf, thigh, or hip brought on by ambulation:    Pain in feet at night that wakes you up from your sleep:     Blood clot in your veins:    Leg swelling:  x         PHYSICAL EXAM: Vitals:   05/23/22 1133  BP: (!) 144/76  Pulse: 63  Temp: (!) 97.5 F (36.4 C)  Weight: 240 lb 6.4 oz (109 kg)  Height: 5' 10.25" (1.784 m)    GENERAL: The patient is a well-nourished male, in no acute distress. The vital signs are documented above. CARDIOVASCULAR: Plus radial pulses bilaterally.  Dopplerable popliteal  pulses bilaterally with no evidence of peripheral aneurysm. PULMONARY: There is good air exchange  MUSCULOSKELETAL: There are no major deformities or cyanosis. NEUROLOGIC: No focal weakness or paresthesias are detected. SKIN: There are no ulcers or rashes noted. PSYCHIATRIC: The patient has a normal affect.  DATA:  Reviewed CT findings from 05/21/2022.  This shows maximal diameter of his infrarenal aorta at 4.8 cm.  MEDICAL ISSUES: I discussed this in detail with patient.  He had had difficulty with imaging on our last visit due to body habitus.  It looked as though he had aneurysm approximately 5 cm.  This was confirmed on today's CT scan.  I reviewed symptoms of leaking aneurysm with the patient.  He does report immediately should this occur.  I reassured him that this may be quite unlikely his current aneurysm size.  We will see him again in 1 year with ultrasound of his abdominal aorta at that time  He does have progressive swelling in both lower extremities.  He has a history of extensive DVT and pulmonary embolus which may be leading to this.  He has attempted to use compression garments in the past was unsuccessful.  I did speak with his wife about potentially wrapping his legs from his foot to his knee with Ace wrap's to improve the swelling.  He will also discuss the potential for diuretics with Dr. Livia Snellen on their visit next week.    Rosetta Posner, MD FACS [facility] [phone]  Note: Portions of this report may have been transcribed using voice recognition software.  Every effort has been made to ensure accuracy; however, inadvertent computerized transcription errors may still be present.

## 2022-05-28 ENCOUNTER — Ambulatory Visit (INDEPENDENT_AMBULATORY_CARE_PROVIDER_SITE_OTHER): Payer: Medicare PPO | Admitting: Family Medicine

## 2022-05-28 ENCOUNTER — Encounter: Payer: Self-pay | Admitting: Family Medicine

## 2022-05-28 VITALS — BP 135/64 | HR 62 | Temp 97.4°F | Ht 70.25 in | Wt 241.2 lb

## 2022-05-28 DIAGNOSIS — E785 Hyperlipidemia, unspecified: Secondary | ICD-10-CM

## 2022-05-28 DIAGNOSIS — B351 Tinea unguium: Secondary | ICD-10-CM

## 2022-05-28 DIAGNOSIS — I10 Essential (primary) hypertension: Secondary | ICD-10-CM | POA: Diagnosis not present

## 2022-05-28 MED ORDER — GABAPENTIN 300 MG PO CAPS: 900.0000 mg | ORAL_CAPSULE | Freq: Two times a day (BID) | ORAL | 3 refills | Status: DC

## 2022-05-28 MED ORDER — TRAZODONE HCL 50 MG PO TABS: 50.0000 mg | ORAL_TABLET | Freq: Every day | ORAL | 3 refills | Status: DC

## 2022-05-28 MED ORDER — LOVASTATIN 40 MG PO TABS: 40.0000 mg | ORAL_TABLET | Freq: Every day | ORAL | 3 refills | Status: DC

## 2022-05-28 MED ORDER — APIXABAN 5 MG PO TABS: 5.0000 mg | ORAL_TABLET | Freq: Two times a day (BID) | ORAL | 3 refills | Status: DC

## 2022-05-28 MED ORDER — METOPROLOL TARTRATE 50 MG PO TABS: 25.0000 mg | ORAL_TABLET | Freq: Two times a day (BID) | ORAL | 1 refills | Status: DC

## 2022-05-28 MED ORDER — POTASSIUM CHLORIDE ER 10 MEQ PO TBCR: 10.0000 meq | EXTENDED_RELEASE_TABLET | Freq: Every day | ORAL | 3 refills | Status: DC

## 2022-05-28 MED ORDER — FUROSEMIDE 40 MG PO TABS: 40.0000 mg | ORAL_TABLET | Freq: Every day | ORAL | 3 refills | Status: DC

## 2022-05-28 NOTE — Progress Notes (Signed)
Subjective:  Patient ID: Jerry Fox, male    DOB: Jul 19, 1933  Age: 89 y.o. MRN: [id number]  CC: Medical Management of Chronic Issues   HPI Jerry Fox presents for  follow-up of hypertension. Patient has no history of headache chest pain or shortness of breath or recent cough. Patient also denies symptoms of TIA such as focal numbness or weakness. Patient denies side effects from medication. States taking it regularly.  Pain interfering with walking. Back hurting. Supplementing the gabapentin with tylenol   in for follow-up of elevated cholesterol. Doing well without complaints on current medication. Denies side effects of statin including myalgia and arthralgia and nausea. Currently no chest pain, shortness of breath or other cardiovascular related symptoms noted.    History Jerry Fox has a past medical history of AAA (abdominal aortic aneurysm) ([place]) (2/14), Arthritis, Atrial fibrillation ([place]) (2/14), Colon polyps, Hyperlipidemia, Hypertension, Murmur, cardiac, and Saddle pulmonary embolus ([place]) (2/14).   He has a past surgical history that includes Tonsillectomy.   His family history includes Arthritis in his father and mother; Congestive Heart Failure in his brother, sister, and sister; Heart disease in his father, mother, and paternal grandfather; Hypertension in his father; Stroke in his brother.He reports that he quit smoking about 68 years ago. His smoking use included cigarettes. He has a 10.00 pack-year smoking history. He quit smokeless tobacco use about 58 years ago.  His smokeless tobacco use included chew. He reports that he does not drink alcohol and does not use drugs.  Current Outpatient Medications on File Prior to Visit  Medication Sig Dispense Refill   albuterol (VENTOLIN HFA) 108 (90 Base) MCG/ACT inhaler Inhale 2 puffs into the lungs every 4 (four) hours as needed for wheezing or shortness of breath. 1 each 0   amLODipine-valsartan (EXFORGE) 5-320 MG tablet  TAKE ONE TABLET ONCE DAILY 90 tablet 0   dorzolamide-timolol (COSOPT) 22.3-6.8 MG/ML ophthalmic solution      latanoprost (XALATAN) 0.005 % ophthalmic solution      lidocaine (LIDODERM) 5 % Place 1 patch onto the skin daily. Remove & Discard patch within 12 hours or as directed by MD     tiZANidine (ZANAFLEX) 4 MG tablet Take 1 tablet (4 mg total) by mouth every 6 (six) hours as needed for muscle spasms. 30 tablet 1   No current facility-administered medications on file prior to visit.    ROS Review of Systems  Constitutional:  Negative for fever.  Respiratory:  Negative for shortness of breath.   Cardiovascular:  Negative for chest pain.  Musculoskeletal:  Negative for arthralgias.  Skin:  Negative for rash.    Objective:  BP 135/64   Pulse 62   Temp (!) 97.4 F (36.3 C)   Ht 5' 10.25" (1.784 m)   Wt 241 lb 3.2 oz (109.4 kg)   SpO2 98%   BMI 34.36 kg/m   BP Readings from Last 3 Encounters:  05/28/22 135/64  05/23/22 (!) 144/76  01/08/22 (!) 140/61    Wt Readings from Last 3 Encounters:  05/28/22 241 lb 3.2 oz (109.4 kg)  05/23/22 240 lb 6.4 oz (109 kg)  01/08/22 233 lb (105.7 kg)     Physical Exam Vitals reviewed.  Constitutional:      Appearance: He is well-developed.  HENT:     Head: Normocephalic and atraumatic.     Right Ear: External ear normal.     Left Ear: External ear normal.     Mouth/Throat:     Pharynx:  No oropharyngeal exudate or posterior oropharyngeal erythema.  Eyes:     Pupils: Pupils are equal, round, and reactive to light.  Cardiovascular:     Rate and Rhythm: Normal rate and regular rhythm.     Heart sounds: No murmur heard. Pulmonary:     Effort: No respiratory distress.     Breath sounds: Normal breath sounds.  Musculoskeletal:     Cervical back: Normal range of motion and neck supple.  Neurological:     Mental Status: He is alert and oriented to person, place, and time.       Assessment & Plan:   Jerry Fox was seen today for  medical management of chronic issues.  Diagnoses and all orders for this visit:  Accelerated hypertension -     CBC with Differential/Platelet -     CMP14+EGFR  Hyperlipidemia, unspecified hyperlipidemia type -     Lipid panel  Onychomycosis -     Ambulatory referral to Podiatry  Other orders -     gabapentin (NEURONTIN) 300 MG capsule; Take 3 capsules (900 mg total) by mouth 2 (two) times daily. -     furosemide (LASIX) 40 MG tablet; Take 1 tablet (40 mg total) by mouth daily. -     metoprolol tartrate (LOPRESSOR) 50 MG tablet; Take 0.5 tablets (25 mg total) by mouth 2 (two) times daily. -     traZODone (DESYREL) 50 MG tablet; Take 1 tablet (50 mg total) by mouth at bedtime. -     lovastatin (MEVACOR) 40 MG tablet; Take 1 tablet (40 mg total) by mouth at bedtime. -     potassium chloride (KLOR-CON) 10 MEQ tablet; Take 1 tablet (10 mEq total) by mouth daily. -     apixaban (ELIQUIS) 5 MG TABS tablet; Take 1 tablet (5 mg total) by mouth 2 (two) times daily.   Allergies as of 05/28/2022       Reactions   Diltiazem Rash        Medication List        Accurate as of May 28, 2022  2:12 PM. If you have any questions, ask your nurse or doctor.          STOP taking these medications    azithromycin 250 MG tablet Commonly known as: Zithromax Z-Pak Stopped by: Claretta Fraise, MD       TAKE these medications    albuterol 108 (90 Base) MCG/ACT inhaler Commonly known as: VENTOLIN HFA Inhale 2 puffs into the lungs every 4 (four) hours as needed for wheezing or shortness of breath.   amLODipine-valsartan 5-320 MG tablet Commonly known as: EXFORGE TAKE ONE TABLET ONCE DAILY   apixaban 5 MG Tabs tablet Commonly known as: ELIQUIS Take 1 tablet (5 mg total) by mouth 2 (two) times daily.   dorzolamide-timolol 2-0.5 % ophthalmic solution Commonly known as: COSOPT   furosemide 40 MG tablet Commonly known as: LASIX Take 1 tablet (40 mg total) by mouth daily.    gabapentin 300 MG capsule Commonly known as: NEURONTIN Take 3 capsules (900 mg total) by mouth 2 (two) times daily. What changed: how much to take Changed by: Claretta Fraise, MD   latanoprost 0.005 % ophthalmic solution Commonly known as: XALATAN   lidocaine 5 % Commonly known as: LIDODERM Place 1 patch onto the skin daily. Remove & Discard patch within 12 hours or as directed by MD   lovastatin 40 MG tablet Commonly known as: MEVACOR Take 1 tablet (40 mg total) by mouth at bedtime.  metoprolol tartrate 50 MG tablet Commonly known as: LOPRESSOR Take 0.5 tablets (25 mg total) by mouth 2 (two) times daily.   potassium chloride 10 MEQ tablet Commonly known as: KLOR-CON Take 1 tablet (10 mEq total) by mouth daily.   tiZANidine 4 MG tablet Commonly known as: ZANAFLEX Take 1 tablet (4 mg total) by mouth every 6 (six) hours as needed for muscle spasms.   traZODone 50 MG tablet Commonly known as: DESYREL Take 1 tablet (50 mg total) by mouth at bedtime.        Meds ordered this encounter  Medications   gabapentin (NEURONTIN) 300 MG capsule    Sig: Take 3 capsules (900 mg total) by mouth 2 (two) times daily.    Dispense:  540 capsule    Refill:  3   furosemide (LASIX) 40 MG tablet    Sig: Take 1 tablet (40 mg total) by mouth daily.    Dispense:  90 tablet    Refill:  3   metoprolol tartrate (LOPRESSOR) 50 MG tablet    Sig: Take 0.5 tablets (25 mg total) by mouth 2 (two) times daily.    Dispense:  180 tablet    Refill:  1   traZODone (DESYREL) 50 MG tablet    Sig: Take 1 tablet (50 mg total) by mouth at bedtime.    Dispense:  90 tablet    Refill:  3   lovastatin (MEVACOR) 40 MG tablet    Sig: Take 1 tablet (40 mg total) by mouth at bedtime.    Dispense:  90 tablet    Refill:  3   potassium chloride (KLOR-CON) 10 MEQ tablet    Sig: Take 1 tablet (10 mEq total) by mouth daily.    Dispense:  90 tablet    Refill:  3   apixaban (ELIQUIS) 5 MG TABS tablet    Sig: Take  1 tablet (5 mg total) by mouth 2 (two) times daily.    Dispense:  180 tablet    Refill:  3      Follow-up: Return in about 6 months (around 11/26/2022).  Claretta Fraise, M.D.

## 2022-05-29 ENCOUNTER — Telehealth: Payer: Self-pay | Admitting: Family Medicine

## 2022-05-29 ENCOUNTER — Other Ambulatory Visit: Payer: Self-pay | Admitting: Family Medicine

## 2022-05-29 LAB — CBC WITH DIFFERENTIAL/PLATELET
Basophils Absolute: 0 10*3/uL (ref 0.0–0.2)
Basos: 0 %
EOS (ABSOLUTE): 0.1 10*3/uL (ref 0.0–0.4)
Eos: 1 %
Hematocrit: 39.3 % (ref 37.5–51.0)
Hemoglobin: 12.9 g/dL — ABNORMAL LOW (ref 13.0–17.7)
Immature Grans (Abs): 0 10*3/uL (ref 0.0–0.1)
Immature Granulocytes: 0 %
Lymphocytes Absolute: 2.5 10*3/uL (ref 0.7–3.1)
Lymphs: 28 %
MCH: 31.6 pg (ref 26.6–33.0)
MCHC: 32.8 g/dL (ref 31.5–35.7)
MCV: 96 fL (ref 79–97)
Monocytes Absolute: 0.5 10*3/uL (ref 0.1–0.9)
Monocytes: 6 %
Neutrophils Absolute: 5.7 10*3/uL (ref 1.4–7.0)
Neutrophils: 65 %
Platelets: 244 10*3/uL (ref 150–450)
RBC: 4.08 x10E6/uL — ABNORMAL LOW (ref 4.14–5.80)
RDW: 11.9 % (ref 11.6–15.4)
WBC: 8.9 10*3/uL (ref 3.4–10.8)

## 2022-05-29 LAB — CMP14+EGFR
ALT: 16 IU/L (ref 0–44)
AST: 17 IU/L (ref 0–40)
Albumin/Globulin Ratio: 1.7 (ref 1.2–2.2)
Albumin: 3.8 g/dL (ref 3.7–4.7)
Alkaline Phosphatase: 54 IU/L (ref 44–121)
BUN/Creatinine Ratio: 15 (ref 10–24)
BUN: 16 mg/dL (ref 8–27)
Bilirubin Total: 0.4 mg/dL (ref 0.0–1.2)
CO2: 26 mmol/L (ref 20–29)
Calcium: 9.1 mg/dL (ref 8.6–10.2)
Chloride: 103 mmol/L (ref 96–106)
Creatinine, Ser: 1.08 mg/dL (ref 0.76–1.27)
Globulin, Total: 2.3 g/dL (ref 1.5–4.5)
Glucose: 179 mg/dL — ABNORMAL HIGH (ref 70–99)
Potassium: 4.8 mmol/L (ref 3.5–5.2)
Sodium: 142 mmol/L (ref 134–144)
Total Protein: 6.1 g/dL (ref 6.0–8.5)
eGFR: 67 mL/min/{1.73_m2} (ref >59)

## 2022-05-29 LAB — LIPID PANEL
Chol/HDL Ratio: 2.9 ratio (ref 0.0–5.0)
Cholesterol, Total: 119 mg/dL (ref 100–199)
HDL: 41 mg/dL (ref >39)
LDL Chol Calc (NIH): 53 mg/dL (ref 0–99)
Triglycerides: 148 mg/dL (ref 0–149)
VLDL Cholesterol Cal: 25 mg/dL (ref 5–40)

## 2022-05-29 MED ORDER — TIZANIDINE HCL 4 MG PO TABS: 4.0000 mg | ORAL_TABLET | Freq: Four times a day (QID) | ORAL | 3 refills | Status: DC | PRN

## 2022-05-29 NOTE — Progress Notes (Signed)
Hello Rea,  Your lab result is normal and/or stable.Some minor variations that are not significant are commonly marked abnormal, but do not represent any medical problem for you.  Best regards, Brycen Bean, M.D.

## 2022-05-29 NOTE — Telephone Encounter (Signed)
Please let the patient know that I sent their prescription to their pharmacy. Thanks, WS 

## 2022-05-29 NOTE — Telephone Encounter (Signed)
Pts wife called stating that pt had an appt with Dr Livia Snellen yesterday and was told by Dr Livia Snellen to find out if he had any Zanaflex Rx left and if not to call the office to let him know and he would send in refills. Wife says he does not have any of the medicine and needs refills sent to the [facility] pharmacy.

## 2022-05-30 NOTE — Telephone Encounter (Signed)
Attempted to contact patient - lime busy

## 2022-06-25 ENCOUNTER — Telehealth: Payer: Self-pay | Admitting: Family Medicine

## 2022-06-25 MED ORDER — AMLODIPINE BESYLATE-VALSARTAN 5-320 MG PO TABS: 1.0000 | ORAL_TABLET | Freq: Every day | ORAL | 1 refills | Status: DC

## 2022-06-25 NOTE — Telephone Encounter (Signed)
Pt aware refill sent to [facility], also let her know that ppw that was faxed in from [place] was sent back end of last week.

## 2022-06-25 NOTE — Telephone Encounter (Signed)
  Prescription Request  06/25/2022  Is this a "Controlled Substance" medicine? no  Have you seen your PCP in the last 2 weeks? no  If YES, route message to pool  -  If NO, patient needs to be scheduled for appointment.  What is the name of the medication or equipment? Amlodipine-Valsartan 5-120 MG  Have you contacted your pharmacy to request a refill? NO   Which pharmacy would you like this sent to? [facility]   Patient notified that their request is being sent to the clinical staff for review and that they should receive a response within 2 business days.

## 2022-06-28 ENCOUNTER — Ambulatory Visit: Payer: Medicare PPO | Admitting: Family Medicine

## 2022-07-03 DIAGNOSIS — B351 Tinea unguium: Secondary | ICD-10-CM | POA: Diagnosis not present

## 2022-07-03 DIAGNOSIS — M79676 Pain in unspecified toe(s): Secondary | ICD-10-CM | POA: Diagnosis not present

## 2022-07-04 ENCOUNTER — Ambulatory Visit (INDEPENDENT_AMBULATORY_CARE_PROVIDER_SITE_OTHER): Payer: Medicare PPO

## 2022-07-04 VITALS — Ht 70.0 in | Wt 227.0 lb

## 2022-07-04 DIAGNOSIS — Z0000 Encounter for general adult medical examination without abnormal findings: Secondary | ICD-10-CM | POA: Diagnosis not present

## 2022-07-04 NOTE — Patient Instructions (Signed)
Jerry Fox , Thank you for taking time to come for your Medicare Wellness Visit. I appreciate your ongoing commitment to your health goals. Please review the following plan we discussed and let me know if I can assist you in the future.   These are the goals we discussed:  Goals       Cut out extra servings      DIET - INCREASE WATER INTAKE      DIET - REDUCE SUGAR INTAKE      Increase physical activity (pt-stated)      Would like to maintain good health.        This is a list of the screening recommended for you and due dates:  Health Maintenance  Topic Date Due   COVID-19 Vaccine (2 - Moderna risk series) 07/18/2020   Flu Shot  07/29/2022*   Medicare Annual Wellness Visit  07/04/2023   DTaP/Tdap/Td vaccine (2 - Td or Tdap) 11/24/2024   Pneumonia Vaccine  Completed   Zoster (Shingles) Vaccine  Completed   HPV Vaccine  Aged Out  *Topic was postponed. The date shown is not the original due date.    Advanced directives: Please bring a copy of your health care power of attorney and living will to the office to be added to your chart at your convenience.    Conditions/risks identified: Aim for 30 minutes of exercise or brisk walking, 6-8 glasses of water, and 5 servings of fruits and vegetables each day.   Next appointment: Follow up in one year for your annual wellness visit.   Preventive Care 74 Years and Older, Male  Preventive care refers to lifestyle choices and visits with your health care provider that can promote health and wellness. What does preventive care include? A yearly physical exam. This is also called an annual well check. Dental exams once or twice a year. Routine eye exams. Ask your health care provider how often you should have your eyes checked. Personal lifestyle choices, including: Daily care of your teeth and gums. Regular physical activity. Eating a healthy diet. Avoiding tobacco and drug use. Limiting alcohol use. Practicing safe sex. Taking  low doses of aspirin every day. Taking vitamin and mineral supplements as recommended by your health care provider. What happens during an annual well check? The services and screenings done by your health care provider during your annual well check will depend on your age, overall health, lifestyle risk factors, and family history of disease. Counseling  Your health care provider may ask you questions about your: Alcohol use. Tobacco use. Drug use. Emotional well-being. Home and relationship well-being. Sexual activity. Eating habits. History of falls. Memory and ability to understand (cognition). Work and work Statistician. Screening  You may have the following tests or measurements: Height, weight, and BMI. Blood pressure. Lipid and cholesterol levels. These may be checked every 5 years, or more frequently if you are over 23 years old. Skin check. Lung cancer screening. You may have this screening every year starting at age 44 if you have a 30-pack-year history of smoking and currently smoke or have quit within the past 15 years. Fecal occult blood test (FOBT) of the stool. You may have this test every year starting at age 30. Flexible sigmoidoscopy or colonoscopy. You may have a sigmoidoscopy every 5 years or a colonoscopy every 10 years starting at age 43. Prostate cancer screening. Recommendations will vary depending on your family history and other risks. Hepatitis C blood test. Hepatitis B blood test.  Sexually transmitted disease (STD) testing. Diabetes screening. This is done by checking your blood sugar (glucose) after you have not eaten for a while (fasting). You may have this done every 1-3 years. Abdominal aortic aneurysm (AAA) screening. You may need this if you are a current or former smoker. Osteoporosis. You may be screened starting at age 28 if you are at high risk. Talk with your health care provider about your test results, treatment options, and if necessary, the  need for more tests. Vaccines  Your health care provider may recommend certain vaccines, such as: Influenza vaccine. This is recommended every year. Tetanus, diphtheria, and acellular pertussis (Tdap, Td) vaccine. You may need a Td booster every 10 years. Zoster vaccine. You may need this after age 61. Pneumococcal 13-valent conjugate (PCV13) vaccine. One dose is recommended after age 61. Pneumococcal polysaccharide (PPSV23) vaccine. One dose is recommended after age 68. Talk to your health care provider about which screenings and vaccines you need and how often you need them. This information is not intended to replace advice given to you by your health care provider. Make sure you discuss any questions you have with your health care provider. Document Released: 05/13/2015 Document Revised: 01/04/2016 Document Reviewed: 02/15/2015 Elsevier Interactive Patient Education  2017 [employer] Prevention in the Home Falls can cause injuries. They can happen to people of all ages. There are many things you can do to make your home safe and to help prevent falls. What can I do on the outside of my home? Regularly fix the edges of walkways and driveways and fix any cracks. Remove anything that might make you trip as you walk through a door, such as a raised step or threshold. Trim any bushes or trees on the path to your home. Use bright outdoor lighting. Clear any walking paths of anything that might make someone trip, such as rocks or tools. Regularly check to see if handrails are loose or broken. Make sure that both sides of any steps have handrails. Any raised decks and porches should have guardrails on the edges. Have any leaves, snow, or ice cleared regularly. Use sand or salt on walking paths during winter. Clean up any spills in your garage right away. This includes oil or grease spills. What can I do in the bathroom? Use night lights. Install grab bars by the toilet and in the tub  and shower. Do not use towel bars as grab bars. Use non-skid mats or decals in the tub or shower. If you need to sit down in the shower, use a plastic, non-slip stool. Keep the floor dry. Clean up any water that spills on the floor as soon as it happens. Remove soap buildup in the tub or shower regularly. Attach bath mats securely with double-sided non-slip rug tape. Do not have throw rugs and other things on the floor that can make you trip. What can I do in the bedroom? Use night lights. Make sure that you have a light by your bed that is easy to reach. Do not use any sheets or blankets that are too big for your bed. They should not hang down onto the floor. Have a firm chair that has side arms. You can use this for support while you get dressed. Do not have throw rugs and other things on the floor that can make you trip. What can I do in the kitchen? Clean up any spills right away. Avoid walking on wet floors. Keep items that you  use a lot in easy-to-reach places. If you need to reach something above you, use a strong step stool that has a grab bar. Keep electrical cords out of the way. Do not use floor polish or wax that makes floors slippery. If you must use wax, use non-skid floor wax. Do not have throw rugs and other things on the floor that can make you trip. What can I do with my stairs? Do not leave any items on the stairs. Make sure that there are handrails on both sides of the stairs and use them. Fix handrails that are broken or loose. Make sure that handrails are as long as the stairways. Check any carpeting to make sure that it is firmly attached to the stairs. Fix any carpet that is loose or worn. Avoid having throw rugs at the top or bottom of the stairs. If you do have throw rugs, attach them to the floor with carpet tape. Make sure that you have a light switch at the top of the stairs and the bottom of the stairs. If you do not have them, ask someone to add them for  you. What else can I do to help prevent falls? Wear shoes that: Do not have high heels. Have rubber bottoms. Are comfortable and fit you well. Are closed at the toe. Do not wear sandals. If you use a stepladder: Make sure that it is fully opened. Do not climb a closed stepladder. Make sure that both sides of the stepladder are locked into place. Ask someone to hold it for you, if possible. Clearly mark and make sure that you can see: Any grab bars or handrails. First and last steps. Where the edge of each step is. Use tools that help you move around (mobility aids) if they are needed. These include: Canes. Walkers. Scooters. Crutches. Turn on the lights when you go into a dark area. Replace any light bulbs as soon as they burn out. Set up your furniture so you have a clear path. Avoid moving your furniture around. If any of your floors are uneven, fix them. If there are any pets around you, be aware of where they are. Review your medicines with your doctor. Some medicines can make you feel dizzy. This can increase your chance of falling. Ask your doctor what other things that you can do to help prevent falls. This information is not intended to replace advice given to you by your health care provider. Make sure you discuss any questions you have with your health care provider. Document Released: 02/10/2009 Document Revised: 09/22/2015 Document Reviewed: 05/21/2014 Elsevier Interactive Patient Education  2017 [employer].

## 2022-07-04 NOTE — Progress Notes (Signed)
Subjective:   Jerry Fox is a 89 y.o. male who presents for Medicare Annual/Subsequent preventive examination. I connected with  Jerry Fox on 07/04/22 by a audio enabled telemedicine application and verified that I am speaking with the correct person using two identifiers.  Patient Location: Home  Provider Location: Home Office  I discussed the limitations of evaluation and management by telemedicine. The patient expressed understanding and agreed to proceed.  Review of Systems     Cardiac Risk Factors include: advanced age (>1mn, >>73women);male gender;hypertension;dyslipidemia     Objective:    Today's Vitals   07/04/22 1342  Weight: 227 lb (103 kg)  Height: '5\' 10"'$  (1.778 m)   Body mass index is 32.57 kg/m.     07/04/2022    1:46 PM 06/27/2021    2:08 PM 06/21/2020    2:11 PM 04/14/2019    2:07 PM 06/10/2012    8:14 PM  Advanced Directives  Does Patient Have a Medical Advance Directive? Yes Yes Yes No Patient does not have advance directive;Patient would not like information  Type of AScientist, forensicPower of AP[place]Living will [place] will [place] will    Does patient want to make changes to medical advance directive?  No - Patient declined No - Patient declined    Copy of HA[place]leyin Chart? No - copy requested No - copy requested No - copy requested    Would patient like information on creating a medical advance directive?    Yes (MAU/Ambulatory/Procedural Areas - Information given)   Pre-existing out of facility DNR order (yellow form or pink MOST form)     No    Current Medications (verified) Outpatient Encounter Medications as of 07/04/2022  Medication Sig   albuterol (VENTOLIN HFA) 108 (90 Base) MCG/ACT inhaler Inhale 2 puffs into the lungs every 4 (four) hours as needed for wheezing or shortness of breath.   amLODipine-valsartan (EXFORGE) 5-320 MG tablet Take 1  tablet by mouth daily.   apixaban (ELIQUIS) 5 MG TABS tablet Take 1 tablet (5 mg total) by mouth 2 (two) times daily.   dorzolamide-timolol (COSOPT) 22.3-6.8 MG/ML ophthalmic solution    furosemide (LASIX) 40 MG tablet Take 1 tablet (40 mg total) by mouth daily.   gabapentin (NEURONTIN) 300 MG capsule Take 3 capsules (900 mg total) by mouth 2 (two) times daily.   latanoprost (XALATAN) 0.005 % ophthalmic solution    lidocaine (LIDODERM) 5 % Place 1 patch onto the skin daily. Remove & Discard patch within 12 hours or as directed by MD   lovastatin (MEVACOR) 40 MG tablet Take 1 tablet (40 mg total) by mouth at bedtime.   metoprolol tartrate (LOPRESSOR) 50 MG tablet Take 0.5 tablets (25 mg total) by mouth 2 (two) times daily.   potassium chloride (KLOR-CON) 10 MEQ tablet Take 1 tablet (10 mEq total) by mouth daily.   tiZANidine (ZANAFLEX) 4 MG tablet Take 1 tablet (4 mg total) by mouth every 6 (six) hours as needed for muscle spasms.   traZODone (DESYREL) 50 MG tablet Take 1 tablet (50 mg total) by mouth at bedtime.   No facility-administered encounter medications on file as of 07/04/2022.    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   LLE DVT   Past Surgical History:  Procedure Laterality Date   TONSILLECTOMY     Family History  Problem Relation Age of Onset   Arthritis Mother    Heart disease Mother    Arthritis Father    Heart disease Father    Hypertension Father    Congestive Heart Failure Sister    Congestive Heart Failure Sister    Stroke Brother    Congestive Heart Failure Brother    Heart disease Paternal Grandfather    Social History   Socioeconomic History   Marital status: Married     Spouse name: Not on file   Number of children: 3   Years of education: Not on file   Highest education level: Not on file  Occupational History   Not on file  Tobacco Use   Smoking status: Former    Packs/day: 1.00    Years: 10.00    Total pack years: 10.00    Types: Cigarettes    Quit date: 01/31/1954    Years since quitting: 68.4   Smokeless tobacco: Former    Types: Chew    Quit date: 02/01/1964  Vaping Use   Vaping Use: Never used  Substance and Sexual Activity   Alcohol use: No   Drug use: Never   Sexual activity: Not on file  Other Topics Concern   Not on file  Social History Narrative   Married & retired   3 sons + 5 grandchildren   Social Determinants of Health   Financial Resource Strain: Low Risk  (07/04/2022)   Overall Financial Resource Strain (CARDIA)    Difficulty of Paying Living Expenses: Not hard at all  Food Insecurity: No Food Insecurity (07/04/2022)   Hunger Vital Sign    Worried About Running Out of Food in the Last Year: Never true    Ran Out of Food in the Last Year: Never true  Transportation Needs: No Transportation Needs (07/04/2022)   PRAPARE - Hydrologist (Medical): No    Lack of Transportation (Non-Medical): No  Physical Activity: Inactive (07/04/2022)   Exercise Vital Sign    Days of Exercise per Week: 0 days    Minutes of Exercise per Session: 0 min  Stress: No Stress Concern Present (07/04/2022)   [place]    Feeling of Stress : Not at all  Social Connections: Moderately Integrated (07/04/2022)   Social Connection and Isolation Panel [NHANES]    Frequency of Communication with Friends and Family: More than three times a week    Frequency of Social Gatherings with Friends and Family: More than three times a week    Attends Religious Services: More than 4 times per year    Active Member of [employer] or Organizations: No    Attends Arts administrator: Never    Marital Status: Married    Tobacco Counseling Counseling given: Not Answered   Clinical Intake:  Pre-visit preparation completed: Yes  Pain : No/denies pain     Nutritional Risks: None Diabetes: No  How often do you need to have someone help you when you read instructions, pamphlets, or other written materials from your doctor or pharmacy?: 1 - Never  Diabetic?no   Interpreter Needed?: No  Information entered by :: Jadene Pierini, LPN   Activities of Daily Living  07/04/2022    1:46 PM  In your present state of health, do you have any difficulty performing the following activities:  Hearing? 0  Vision? 0  Difficulty concentrating or making decisions? 0  Walking or climbing stairs? 0  Dressing or bathing? 0  Doing errands, shopping? 0  Preparing Food and eating ? N  Using the Toilet? N  In the past six months, have you accidently leaked urine? N  Do you have problems with loss of bowel control? N  Managing your Medications? N  Managing your Finances? N  Housekeeping or managing your Housekeeping? N    Patient Care Team: Claretta Fraise, MD as PCP - General (Family Medicine)  Indicate any recent Medical Services you may have received from other than [facility] providers in the past year (date may be approximate).     Assessment:   This is a routine wellness examination for Neils.  Hearing/Vision screen Vision Screening - Comments:: Wears rx glasses - up to date with routine eye exams with  Dr.Drake   Dietary issues and exercise activities discussed: Current Exercise Habits: The patient does not participate in regular exercise at present   Goals Addressed             This Visit's Progress    DIET - INCREASE WATER INTAKE         Depression Screen    07/04/2022    1:45 PM 05/28/2022    1:22 PM 01/08/2022   11:29 AM 12/07/2021   10:29 AM 11/08/2021   10:53 AM 09/26/2021   10:03 AM 09/04/2021   10:37 AM  PHQ 2/9 Scores  PHQ - 2 Score 0 0  0 0 0 0 0    Fall Risk    07/04/2022    1:44 PM 07/04/2022    1:43 PM 05/28/2022    1:21 PM 01/08/2022   11:29 AM 12/07/2021   10:29 AM  [place] in the past year? 0 0 0 0 0  Number falls in past yr: 0 0     Injury with Fall? 0 0     Risk for fall due to : No Fall Risks No Fall Risks     Follow up Falls prevention discussed Falls prevention discussed       FALL RISK PREVENTION PERTAINING TO THE HOME:  Any stairs in or around the home? No  If so, are there any without handrails? No  Home free of loose throw rugs in walkways, pet beds, electrical cords, etc? Yes  Adequate lighting in your home to reduce risk of falls? Yes   ASSISTIVE DEVICES UTILIZED TO PREVENT FALLS:  Life alert? No  Use of a cane, walker or w/c? No  Grab bars in the bathroom? No  Shower chair or bench in shower? No  Elevated toilet seat or a handicapped toilet? No          07/04/2022    1:46 PM 06/27/2021    2:09 PM 04/14/2019    2:09 PM  6CIT Screen  What Year? 0 points 0 points 0 points  What month? 0 points 0 points 0 points  What time? 0 points 0 points 0 points  Count back from 20 0 points 0 points 0 points  Months in reverse 0 points 0 points 2 points  Repeat phrase 0 points 0 points 2 points  Total Score 0 points 0 points 4 points    Immunizations Immunization History  Administered Date(s) Administered   [employer]  Quad(high Dose 65+) 01/16/2019   Influenza Split 02/01/2011, 04/09/2012   Influenza, High Dose Seasonal PF 02/20/2013, 03/03/2014, 02/28/2016, 04/01/2017   Moderna Sars-Covid-2 Vaccination 06/20/2020   Pneumococcal Conjugate-13 11/25/2014   Pneumococcal Polysaccharide-23 11/28/2015   Tdap 11/25/2014   Zoster Recombinat (Shingrix) 10/07/2017, 12/14/2017    TDAP status: Up to date  Flu Vaccine status: Up to date  Pneumococcal vaccine status: Up to date  Covid-19 vaccine status: Completed vaccines  Qualifies for Shingles Vaccine? Yes   Zostavax completed Yes   Shingrix  Completed?: Yes  Screening Tests Health Maintenance  Topic Date Due   COVID-19 Vaccine (2 - Moderna risk series) 07/18/2020   INFLUENZA VACCINE  07/29/2022 (Originally 11/28/2021)   Medicare Annual Wellness (AWV)  07/04/2023   DTaP/Tdap/Td (2 - Td or Tdap) 11/24/2024   Pneumonia Vaccine 37+ Years old  Completed   Zoster Vaccines- Shingrix  Completed   HPV VACCINES  Aged Out    Health Maintenance  Health Maintenance Due  Topic Date Due   COVID-19 Vaccine (2 - Moderna risk series) 07/18/2020    Colorectal cancer screening: No longer required.   Lung Cancer Screening: (Low Dose CT Chest recommended if Age 70-80 years, 30 pack-year currently smoking OR have quit w/in 15years.) does not qualify.   Lung Cancer Screening Referral: n/a  Additional Screening:  Hepatitis C Screening: does not qualify;   Vision Screening: Recommended annual ophthalmology exams for early detection of glaucoma and other disorders of the eye. Is the patient up to date with their annual eye exam?  Yes  Who is the provider or what is the name of the office in which the patient attends annual eye exams? Dr.drake  If pt is not established with a provider, would they like to be referred to a provider to establish care? No .   Dental Screening: Recommended annual dental exams for proper oral hygiene  Community Resource Referral / Chronic Care Management: CRR required this visit?  No   CCM required this visit?  No      Plan:     I have personally reviewed and noted the following in the patient's chart:   Medical and social history Use of alcohol, tobacco or illicit drugs  Current medications and supplements including opioid prescriptions. Patient is not currently taking opioid prescriptions. Functional ability and status Nutritional status Physical activity Advanced directives List of other physicians Hospitalizations, surgeries, and ER visits in previous 12 months Vitals Screenings to include  cognitive, depression, and falls Referrals and appointments  In addition, I have reviewed and discussed with patient certain preventive protocols, quality metrics, and best practice recommendations. A written personalized care plan for preventive services as well as general preventive health recommendations were provided to patient.     Daphane Shepherd, LPN   [id number]   Nurse Notes: none

## 2022-07-06 ENCOUNTER — Other Ambulatory Visit: Payer: Self-pay | Admitting: Family Medicine

## 2022-07-11 ENCOUNTER — Ambulatory Visit: Payer: Medicare PPO | Admitting: Family Medicine

## 2022-07-12 ENCOUNTER — Other Ambulatory Visit: Payer: Self-pay | Admitting: Family Medicine

## 2022-07-12 MED ORDER — AMLODIPINE BESYLATE 5 MG PO TABS: 5.0000 mg | ORAL_TABLET | Freq: Every day | ORAL | 1 refills | Status: DC

## 2022-07-12 MED ORDER — VALSARTAN 320 MG PO TABS: 320.0000 mg | ORAL_TABLET | Freq: Every day | ORAL | 1 refills | Status: DC

## 2022-08-07 ENCOUNTER — Encounter: Payer: Self-pay | Admitting: Family Medicine

## 2022-08-07 ENCOUNTER — Ambulatory Visit: Payer: No Typology Code available for payment source | Admitting: Family Medicine

## 2022-08-07 ENCOUNTER — Ambulatory Visit (INDEPENDENT_AMBULATORY_CARE_PROVIDER_SITE_OTHER): Payer: No Typology Code available for payment source

## 2022-08-07 VITALS — BP 162/71 | HR 65 | Temp 97.9°F | Ht 70.0 in | Wt 240.0 lb

## 2022-08-07 DIAGNOSIS — M5136 Other intervertebral disc degeneration, lumbar region: Secondary | ICD-10-CM | POA: Diagnosis not present

## 2022-08-07 DIAGNOSIS — G8929 Other chronic pain: Secondary | ICD-10-CM | POA: Diagnosis not present

## 2022-08-07 DIAGNOSIS — M47816 Spondylosis without myelopathy or radiculopathy, lumbar region: Secondary | ICD-10-CM

## 2022-08-07 DIAGNOSIS — S32010A Wedge compression fracture of first lumbar vertebra, initial encounter for closed fracture: Secondary | ICD-10-CM | POA: Diagnosis not present

## 2022-08-07 DIAGNOSIS — M545 Low back pain, unspecified: Secondary | ICD-10-CM | POA: Diagnosis not present

## 2022-08-07 MED ORDER — BETAMETHASONE SOD PHOS & ACET 6 (3-3) MG/ML IJ SUSP
12.0000 mg | Freq: Once | INTRAMUSCULAR | Status: AC
  Administered 2022-08-07: 12 mg via INTRAMUSCULAR

## 2022-08-07 NOTE — Progress Notes (Signed)
Subjective:  Patient ID: Jerry Fox, male    DOB: 1933/11/29  Age: 89 y.o. MRN: [id number]  CC: Back Pain   HPI Jerry Fox presents for low back pain. 3-4/10 if he is sitting still. 7-8/10 with ambulation. Calms down in a few minutes if he sits back down. Very concerned for possible compression. He had one last year and this one seems like that. Denies radiation to the buttocks or legs. He has a history of chronic low ack pain and arthritis.     08/07/2022    4:20 PM 08/07/2022    4:03 PM 07/04/2022    1:45 PM  Depression screen PHQ 2/9  Decreased Interest 3 0 0  Down, Depressed, Hopeless 0 0 0  PHQ - 2 Score 3 0 0  Altered sleeping 0    Tired, decreased energy 0    Change in appetite 0    Feeling bad or failure about yourself  0    Trouble concentrating 0    Moving slowly or fidgety/restless 1    Suicidal thoughts 0    PHQ-9 Score 4    Difficult doing work/chores Not difficult at all      History Jerry Fox has a past medical history of AAA (abdominal aortic aneurysm) (2/14), Arthritis, Atrial fibrillation (2/14), Colon polyps, Hyperlipidemia, Hypertension, Murmur, cardiac, and Saddle pulmonary embolus (2/14).   He has a past surgical history that includes Tonsillectomy.   His family history includes Arthritis in his father and mother; Congestive Heart Failure in his brother, sister, and sister; Heart disease in his father, mother, and paternal grandfather; Hypertension in his father; Stroke in his brother.He reports that he quit smoking about 68 years ago. His smoking use included cigarettes. He has a 10.00 pack-year smoking history. He quit smokeless tobacco use about 58 years ago.  His smokeless tobacco use included chew. He reports that he does not drink alcohol and does not use drugs.    ROS Review of Systems  Constitutional:  Negative for fever.  Respiratory:  Negative for shortness of breath.   Cardiovascular:  Negative for chest pain.  Musculoskeletal:   Negative for arthralgias.  Skin:  Negative for rash.    Objective:  BP (!) 162/71   Pulse 65   Temp 97.9 F (36.6 C)   Ht 5\' 10"  (1.778 m)   Wt 240 lb (108.9 kg)   SpO2 98%   BMI 34.44 kg/m   BP Readings from Last 3 Encounters:  08/07/22 (!) 162/71  05/28/22 135/64  05/23/22 (!) 144/76    Wt Readings from Last 3 Encounters:  08/07/22 240 lb (108.9 kg)  07/04/22 227 lb (103 kg)  05/28/22 241 lb 3.2 oz (109.4 kg)     Physical Exam Vitals reviewed.  Constitutional:      General: He is not in acute distress.    Appearance: Normal appearance. He is well-developed. He is obese. He is not ill-appearing.  HENT:     Head: Normocephalic and atraumatic.     Right Ear: External ear normal.     Left Ear: External ear normal.     Mouth/Throat:     Pharynx: No oropharyngeal exudate or posterior oropharyngeal erythema.  Eyes:     Pupils: Pupils are equal, round, and reactive to light.  Cardiovascular:     Rate and Rhythm: Normal rate and regular rhythm.     Heart sounds: No murmur heard. Pulmonary:     Effort: No respiratory distress.  Breath sounds: Normal breath sounds.  Musculoskeletal:        General: Tenderness (at the left L5 paraspinous musculature.) present.     Cervical back: Normal range of motion and neck supple.  Neurological:     Mental Status: He is alert and oriented to person, place, and time.       Assessment & Plan:   Jerry Fox was seen today for back pain.  Diagnoses and all orders for this visit:  Chronic midline low back pain without sciatica -     DG Lumbar Spine 2-3 Views; Future -     betamethasone acetate-betamethasone sodium phosphate (CELESTONE) injection 12 mg  Lumbar spondylosis     I am having Draven C. Aguayo maintain his dorzolamide-timolol, latanoprost, lidocaine, albuterol, gabapentin, furosemide, metoprolol tartrate, traZODone, lovastatin, potassium chloride, apixaban, tiZANidine, amLODipine, and valsartan. We administered  betamethasone acetate-betamethasone sodium phosphate.  Allergies as of 08/07/2022       Reactions   Diltiazem Rash        Medication List        Accurate as of August 07, 2022  9:07 PM. If you have any questions, ask your nurse or doctor.          albuterol 108 (90 Base) MCG/ACT inhaler Commonly known as: VENTOLIN HFA Inhale 2 puffs into the lungs every 4 (four) hours as needed for wheezing or shortness of breath.   amLODipine 5 MG tablet Commonly known as: NORVASC Take 1 tablet (5 mg total) by mouth daily. For blood pressure   apixaban 5 MG Tabs tablet Commonly known as: ELIQUIS Take 1 tablet (5 mg total) by mouth 2 (two) times daily.   dorzolamide-timolol 2-0.5 % ophthalmic solution Commonly known as: COSOPT   furosemide 40 MG tablet Commonly known as: LASIX Take 1 tablet (40 mg total) by mouth daily.   gabapentin 300 MG capsule Commonly known as: NEURONTIN Take 3 capsules (900 mg total) by mouth 2 (two) times daily.   latanoprost 0.005 % ophthalmic solution Commonly known as: XALATAN   lidocaine 5 % Commonly known as: LIDODERM Place 1 patch onto the skin daily. Remove & Discard patch within 12 hours or as directed by MD   lovastatin 40 MG tablet Commonly known as: MEVACOR Take 1 tablet (40 mg total) by mouth at bedtime.   metoprolol tartrate 50 MG tablet Commonly known as: LOPRESSOR Take 0.5 tablets (25 mg total) by mouth 2 (two) times daily.   potassium chloride 10 MEQ tablet Commonly known as: KLOR-CON Take 1 tablet (10 mEq total) by mouth daily.   tiZANidine 4 MG tablet Commonly known as: ZANAFLEX Take 1 tablet (4 mg total) by mouth every 6 (six) hours as needed for muscle spasms.   traZODone 50 MG tablet Commonly known as: DESYREL Take 1 tablet (50 mg total) by mouth at bedtime.   valsartan 320 MG tablet Commonly known as: DIOVAN Take 1 tablet (320 mg total) by mouth daily. For blood pressure.       XR - lumbar spondylosis. L1 wedge  compression, old. No comression at level 5 where the pain is felt.   Follow-up: Return if symptoms worsen or fail to improve.  Mechele Claude, M.D.

## 2022-08-10 ENCOUNTER — Telehealth: Payer: Self-pay | Admitting: Family Medicine

## 2022-08-13 ENCOUNTER — Telehealth: Payer: Self-pay

## 2022-08-13 NOTE — Telephone Encounter (Signed)
I discussed the XR, compared it to the recent CT. I feel the CT, being so recent is likely the more accurate test, ut since he is having pain in addition to the finding on the LS spine XR, he should have a follow up with Dr. Arbie Cookey.

## 2022-08-13 NOTE — Telephone Encounter (Signed)
Pt's wife, Kennon Rounds, called stating that the pt had some lumbar x-rays taken and the results had shown that his AAA is now larger.  Reviewed pt's chart, returned call for clarification, no answer, lf vm.  Pt's wife returned call, two identifiers used. Informed her that the x-ray was comparing the results from the previous x-ray last year. The pt had a CT in January showing 4.8 cm. Reassured her that the CT was a much more precise diagnostic test. Confirmed with Dr. Arbie Cookey. Pt has a 1 yr f/u with [place] in 05/2023.

## 2022-08-13 NOTE — Progress Notes (Signed)
Pt. Was notified of results and plan of action. They have been following with vascular and will contact them for appt. WS

## 2022-08-14 NOTE — Telephone Encounter (Signed)
CALLED WIFE, NO ANSWER, MAILBOX FULL

## 2022-08-15 NOTE — Telephone Encounter (Signed)
RETURNED CALL, NO ANSWER 

## 2022-10-11 DIAGNOSIS — M79676 Pain in unspecified toe(s): Secondary | ICD-10-CM | POA: Diagnosis not present

## 2022-10-11 DIAGNOSIS — B351 Tinea unguium: Secondary | ICD-10-CM | POA: Diagnosis not present

## 2022-10-25 ENCOUNTER — Encounter (HOSPITAL_COMMUNITY): Payer: Self-pay

## 2022-10-25 ENCOUNTER — Observation Stay (HOSPITAL_COMMUNITY)
Admission: EM | Admit: 2022-10-25 | Discharge: 2022-10-26 | Disposition: A | Discharge status: Home Health | Payer: Medicare PPO | Attending: Internal Medicine | Admitting: Internal Medicine

## 2022-10-25 ENCOUNTER — Emergency Department (HOSPITAL_COMMUNITY): Payer: Medicare PPO

## 2022-10-25 ENCOUNTER — Other Ambulatory Visit: Payer: Self-pay

## 2022-10-25 DIAGNOSIS — Z79899 Other long term (current) drug therapy: Secondary | ICD-10-CM | POA: Insufficient documentation

## 2022-10-25 DIAGNOSIS — Z86711 Personal history of pulmonary embolism: Secondary | ICD-10-CM

## 2022-10-25 DIAGNOSIS — R600 Localized edema: Secondary | ICD-10-CM | POA: Diagnosis not present

## 2022-10-25 DIAGNOSIS — I480 Paroxysmal atrial fibrillation: Secondary | ICD-10-CM | POA: Insufficient documentation

## 2022-10-25 DIAGNOSIS — E785 Hyperlipidemia, unspecified: Secondary | ICD-10-CM | POA: Diagnosis present

## 2022-10-25 DIAGNOSIS — N179 Acute kidney failure, unspecified: Secondary | ICD-10-CM | POA: Diagnosis not present

## 2022-10-25 DIAGNOSIS — Z86718 Personal history of other venous thrombosis and embolism: Secondary | ICD-10-CM | POA: Insufficient documentation

## 2022-10-25 DIAGNOSIS — Z7901 Long term (current) use of anticoagulants: Secondary | ICD-10-CM | POA: Diagnosis not present

## 2022-10-25 DIAGNOSIS — I952 Hypotension due to drugs: Secondary | ICD-10-CM

## 2022-10-25 DIAGNOSIS — I10 Essential (primary) hypertension: Secondary | ICD-10-CM | POA: Insufficient documentation

## 2022-10-25 DIAGNOSIS — R001 Bradycardia, unspecified: Secondary | ICD-10-CM | POA: Diagnosis not present

## 2022-10-25 DIAGNOSIS — Z87891 Personal history of nicotine dependence: Secondary | ICD-10-CM | POA: Diagnosis not present

## 2022-10-25 DIAGNOSIS — E8770 Fluid overload, unspecified: Secondary | ICD-10-CM | POA: Insufficient documentation

## 2022-10-25 DIAGNOSIS — I959 Hypotension, unspecified: Secondary | ICD-10-CM | POA: Diagnosis not present

## 2022-10-25 DIAGNOSIS — E872 Acidosis, unspecified: Secondary | ICD-10-CM | POA: Insufficient documentation

## 2022-10-25 LAB — URINALYSIS, ROUTINE W REFLEX MICROSCOPIC
Bilirubin Urine: NEGATIVE
Glucose, UA: NEGATIVE mg/dL
Hgb urine dipstick: NEGATIVE
Ketones, ur: NEGATIVE mg/dL
Leukocytes,Ua: NEGATIVE
Nitrite: NEGATIVE
Protein, ur: NEGATIVE mg/dL
Specific Gravity, Urine: 1.006 (ref 1.005–1.030)
pH: 5 (ref 5.0–8.0)

## 2022-10-25 LAB — CBC WITH DIFFERENTIAL/PLATELET
Abs Immature Granulocytes: 0.02 10*3/uL (ref 0.00–0.07)
Basophils Absolute: 0 10*3/uL (ref 0.0–0.1)
Basophils Relative: 1 %
Eosinophils Absolute: 0.1 10*3/uL (ref 0.0–0.5)
Eosinophils Relative: 1 %
HCT: 36.8 % — ABNORMAL LOW (ref 39.0–52.0)
Hemoglobin: 12.1 g/dL — ABNORMAL LOW (ref 13.0–17.0)
Immature Granulocytes: 0 %
Lymphocytes Relative: 36 %
Lymphs Abs: 2.4 10*3/uL (ref 0.7–4.0)
MCH: 33.2 pg (ref 26.0–34.0)
MCHC: 32.9 g/dL (ref 30.0–36.0)
MCV: 100.8 fL — ABNORMAL HIGH (ref 80.0–100.0)
Monocytes Absolute: 0.6 10*3/uL (ref 0.1–1.0)
Monocytes Relative: 9 %
Neutro Abs: 3.6 10*3/uL (ref 1.7–7.7)
Neutrophils Relative %: 53 %
Platelets: 216 10*3/uL (ref 150–400)
RBC: 3.65 MIL/uL — ABNORMAL LOW (ref 4.22–5.81)
RDW: 13.2 % (ref 11.5–15.5)
WBC: 6.7 10*3/uL (ref 4.0–10.5)
nRBC: 0 % (ref 0.0–0.2)

## 2022-10-25 LAB — BASIC METABOLIC PANEL
Anion gap: 9 (ref 5–15)
Anion gap: 15 (ref 5–15)
BUN: 16 mg/dL (ref 8–23)
BUN: 13 mg/dL (ref 8–23)
CO2: 24 mmol/L (ref 22–32)
CO2: 23 mmol/L (ref 22–32)
Calcium: 8.6 mg/dL — ABNORMAL LOW (ref 8.9–10.3)
Calcium: 9 mg/dL (ref 8.9–10.3)
Chloride: 103 mmol/L (ref 98–111)
Chloride: 101 mmol/L (ref 98–111)
Creatinine, Ser: 1.3 mg/dL — ABNORMAL HIGH (ref 0.61–1.24)
Creatinine, Ser: 1.15 mg/dL (ref 0.61–1.24)
GFR, Estimated: 54 mL/min — ABNORMAL LOW (ref >60)
GFR, Estimated: >60 mL/min (ref >60)
Glucose, Bld: 174 mg/dL — ABNORMAL HIGH (ref 70–99)
Glucose, Bld: 99 mg/dL (ref 70–99)
Potassium: 4.2 mmol/L (ref 3.5–5.1)
Potassium: 5.4 mmol/L — ABNORMAL HIGH (ref 3.5–5.1)
Sodium: 136 mmol/L (ref 135–145)
Sodium: 139 mmol/L (ref 135–145)

## 2022-10-25 LAB — LACTIC ACID, PLASMA
Lactic Acid, Venous: 2.1 mmol/L (ref 0.5–1.9)
Lactic Acid, Venous: 2.1 mmol/L (ref 0.5–1.9)

## 2022-10-25 LAB — CBG MONITORING, ED: Glucose-Capillary: 131 mg/dL — ABNORMAL HIGH (ref 70–99)

## 2022-10-25 LAB — TROPONIN I (HIGH SENSITIVITY): Troponin I (High Sensitivity): 7 ng/L (ref <18)

## 2022-10-25 MED ORDER — SODIUM CHLORIDE 0.9 % IV SOLN: INTRAVENOUS | Status: DC

## 2022-10-25 MED ORDER — ACETAMINOPHEN 650 MG RE SUPP: 650.0000 mg | Freq: Four times a day (QID) | RECTAL | Status: DC | PRN

## 2022-10-25 MED ORDER — HEPARIN SODIUM (PORCINE) 5000 UNIT/ML IJ SOLN
5000.0000 [IU] | Freq: Three times a day (TID) | INTRAMUSCULAR | Status: DC
  Administered 2022-10-26 (×2): 5000 [IU] via SUBCUTANEOUS
  Filled 2022-10-25 (×2): qty 1

## 2022-10-25 MED ORDER — SODIUM CHLORIDE 0.9 % IV BOLUS (SEPSIS)
1000.0000 mL | Freq: Once | INTRAVENOUS | Status: AC
  Administered 2022-10-25: 1000 mL via INTRAVENOUS

## 2022-10-25 MED ORDER — LACTATED RINGERS IV BOLUS
1000.0000 mL | Freq: Once | INTRAVENOUS | Status: AC
  Administered 2022-10-25: 1000 mL via INTRAVENOUS

## 2022-10-25 MED ORDER — ACETAMINOPHEN 325 MG PO TABS: 650.0000 mg | ORAL_TABLET | Freq: Four times a day (QID) | ORAL | Status: DC | PRN

## 2022-10-25 MED ORDER — SODIUM CHLORIDE 0.9 % IV SOLN
1000.0000 mL | INTRAVENOUS | Status: DC
  Administered 2022-10-25: 1000 mL via INTRAVENOUS

## 2022-10-25 MED ORDER — SODIUM CHLORIDE 0.9 % IV SOLN: INTRAVENOUS | Status: AC

## 2022-10-25 MED ORDER — ONDANSETRON HCL 4 MG/2ML IJ SOLN: 4.0000 mg | Freq: Four times a day (QID) | INTRAMUSCULAR | Status: DC | PRN

## 2022-10-25 MED ORDER — SENNOSIDES-DOCUSATE SODIUM 8.6-50 MG PO TABS: 1.0000 | ORAL_TABLET | Freq: Every evening | ORAL | Status: DC | PRN

## 2022-10-25 MED ORDER — ONDANSETRON HCL 4 MG PO TABS: 4.0000 mg | ORAL_TABLET | Freq: Four times a day (QID) | ORAL | Status: DC | PRN

## 2022-10-25 NOTE — Plan of Care (Signed)

## 2022-10-25 NOTE — ED Notes (Signed)
Lemar Livings. (Son) [phone]. When you get a moment he would like an update

## 2022-10-25 NOTE — ED Provider Notes (Signed)
[place] EMERGENCY DEPARTMENT AT [facility] Provider Note   CSN: [id number] Arrival date & time: 10/25/22  1414     History  Chief Complaint  Patient presents with   Hypotension   Bradycardia    Jerry Fox is a 89 y.o. male.  HPI   Patient has a history of arthritis, hypertension hyperlipidemia, atrial fibrillation, pulmonary embolism, abdominal aortic aneurysm.  Patient presents to the ED for evaluation of low blood pressure.  Patient has been feeling weak for the last couple of days.  Yesterday he went to an urgent care when they noted his blood pressure was in the 90s.  Patient's wife states that reportedly the evaluation was reassuring.  He was not given any prescriptions and they did not make any changes in his therapy other than to tell him to come to the ED if he had recurrent low blood pressure.  Wife states this morning his blood pressure was normal again in the 140s.  She gave him his normal medications including his blood pressure medications and following that his blood pressure has again dropped.  Patient states he was feeling weak and lightheaded.  He was feeling spots.  He has not had any fevers or chills.  He does not have any chest pain.  He did have some pain in his neck yesterday though.  He is not having any abdominal pain.  He is not any vomiting or diarrhea.  He has not noticed any blood in his stool  Home Medications Prior to Admission medications   Medication Sig Start Date End Date Taking? Authorizing Provider  albuterol (VENTOLIN HFA) 108 (90 Base) MCG/ACT inhaler Inhale 2 puffs into the lungs every 4 (four) hours as needed for wheezing or shortness of breath. 04/28/21   Burchette, Elberta Fortis, MD  amLODipine (NORVASC) 5 MG tablet Take 1 tablet (5 mg total) by mouth daily. For blood pressure 07/12/22   Mechele Claude, MD  apixaban (ELIQUIS) 5 MG TABS tablet Take 1 tablet (5 mg total) by mouth 2 (two) times daily. 05/28/22   Mechele Claude, MD   dorzolamide-timolol (COSOPT) 22.3-6.8 MG/ML ophthalmic solution  10/26/15   [provider]  furosemide (LASIX) 40 MG tablet Take 1 tablet (40 mg total) by mouth daily. 05/28/22   Mechele Claude, MD  gabapentin (NEURONTIN) 300 MG capsule Take 3 capsules (900 mg total) by mouth 2 (two) times daily. 05/28/22   Mechele Claude, MD  latanoprost (XALATAN) 0.005 % ophthalmic solution  10/26/15   [provider]  lidocaine (LIDODERM) 5 % Place 1 patch onto the skin daily. Remove & Discard patch within 12 hours or as directed by MD    [provider]  lovastatin (MEVACOR) 40 MG tablet Take 1 tablet (40 mg total) by mouth at bedtime. 05/28/22   Mechele Claude, MD  metoprolol tartrate (LOPRESSOR) 50 MG tablet Take 0.5 tablets (25 mg total) by mouth 2 (two) times daily. 05/28/22   Mechele Claude, MD  potassium chloride (KLOR-CON) 10 MEQ tablet Take 1 tablet (10 mEq total) by mouth daily. 05/28/22   Mechele Claude, MD  tiZANidine (ZANAFLEX) 4 MG tablet Take 1 tablet (4 mg total) by mouth every 6 (six) hours as needed for muscle spasms. 05/29/22   Mechele Claude, MD  traZODone (DESYREL) 50 MG tablet Take 1 tablet (50 mg total) by mouth at bedtime. 05/28/22   Mechele Claude, MD  valsartan (DIOVAN) 320 MG tablet Take 1 tablet (320 mg total) by mouth daily. For blood  pressure. 07/12/22   Mechele Claude, MD      Allergies    Diltiazem    Review of Systems   Review of Systems  Physical Exam Updated Vital Signs BP (!) 149/80   Pulse 80   Temp 98 F (36.7 C) (Oral)   Resp 20   Ht 1.778 m (5\' 10" )   Wt 108.9 kg   SpO2 94%   BMI 34.45 kg/m  Physical Exam Vitals and nursing note reviewed.  Constitutional:      Appearance: He is well-developed. He is obese. He is not diaphoretic.  HENT:     Head: Normocephalic and atraumatic.     Right Ear: External ear normal.     Left Ear: External ear normal.  Eyes:     General: No scleral icterus.       Right eye: No discharge.        Left  eye: No discharge.     Conjunctiva/sclera: Conjunctivae normal.  Neck:     Trachea: No tracheal deviation.  Cardiovascular:     Rate and Rhythm: Normal rate and regular rhythm.  Pulmonary:     Effort: Pulmonary effort is normal. No respiratory distress.     Breath sounds: Normal breath sounds. No stridor. No wheezing or rales.  Abdominal:     General: Bowel sounds are normal. There is no distension.     Palpations: Abdomen is soft.     Tenderness: There is no abdominal tenderness. There is no guarding or rebound.  Musculoskeletal:        General: No tenderness or deformity.     Cervical back: Neck supple.     Right lower leg: Edema present.     Left lower leg: Edema present.  Skin:    General: Skin is warm and dry.     Findings: No rash.  Neurological:     General: No focal deficit present.     Mental Status: He is alert.     Cranial Nerves: No cranial nerve deficit, dysarthria or facial asymmetry.     Sensory: No sensory deficit.     Motor: No abnormal muscle tone or seizure activity.     Coordination: Coordination normal.  Psychiatric:        Mood and Affect: Mood normal.     ED Results / Procedures / Treatments   Labs (all labs ordered are listed, but only abnormal results are displayed) Labs Reviewed  BASIC METABOLIC PANEL - Abnormal; Notable for the following components:      Result Value   Glucose, Bld 174 (*)    Creatinine, Ser 1.30 (*)    Calcium 8.6 (*)    GFR, Estimated 54 (*)    All other components within normal limits  CBC WITH DIFFERENTIAL/PLATELET - Abnormal; Notable for the following components:   RBC 3.65 (*)    Hemoglobin 12.1 (*)    HCT 36.8 (*)    MCV 100.8 (*)    All other components within normal limits  LACTIC ACID, PLASMA - Abnormal; Notable for the following components:   Lactic Acid, Venous 2.1 (*)    All other components within normal limits  LACTIC ACID, PLASMA - Abnormal; Notable for the following components:   Lactic Acid, Venous 2.1  (*)    All other components within normal limits  BASIC METABOLIC PANEL - Abnormal; Notable for the following components:   Potassium 5.4 (*)    All other components within normal limits  CBG MONITORING, ED - Abnormal;  Notable for the following components:   Glucose-Capillary 131 (*)    All other components within normal limits  CULTURE, BLOOD (ROUTINE X 2)  CULTURE, BLOOD (ROUTINE X 2)  URINALYSIS, ROUTINE W REFLEX MICROSCOPIC  BRAIN NATRIURETIC PEPTIDE  TROPONIN I (HIGH SENSITIVITY)    EKG EKG Interpretation Date/Time:  Thursday October 25 2022 14:23:07 EDT Ventricular Rate:  46 PR Interval:  182 QRS Duration:  108 QT Interval:  476 QTC Calculation: 416 R Axis:   22  Text Interpretation: Sinus bradycardia Otherwise normal ECG When compared with ECG of 11-Jun-2012 06:46,  rate decreased Confirmed by Benjiman Core [phone]) on 10/25/2022 2:42:47 PM  Radiology DG Chest Portable 1 View  Result Date: 10/25/2022 CLINICAL DATA:  Weakness. EXAM: PORTABLE CHEST 1 VIEW COMPARISON:  April 28, 2021. FINDINGS: Stable cardiomediastinal silhouette. Both lungs are clear. The visualized skeletal structures are unremarkable. IMPRESSION: No active disease. Electronically Signed   By: Lupita Raider M.D.   On: 10/25/2022 18:48    Procedures Procedures    Medications Ordered in ED Medications  sodium chloride 0.9 % bolus 1,000 mL (0 mLs Intravenous Stopped 10/25/22 1826)    Followed by  0.9 %  sodium chloride infusion (0 mLs Intravenous Stopped 10/25/22 1826)  0.9 %  sodium chloride infusion ( Intravenous New Bag/Given 10/25/22 2002)  lactated ringers bolus 1,000 mL (0 mLs Intravenous Stopped 10/25/22 1713)    ED Course/ Medical Decision Making/ A&P Clinical Course as of 10/25/22 2119  Thu Oct 25, 2022  1625 Blood pressure improving slightly.  Heart rate increasing [JK]  1710 CBC with Differential(!) CBC normal. [JK]  1711 Basic metabolic panel(!) Slightly increased compared to  previous.  Troponin normal. [JK]  1818 Lactic acid increased [JK]  1819 Urinalysis without signs of infection [JK]  1855 His blood pressure has improved.  No longer hypotensive or bradycardic [JK]  1927 Case reviewed with Dr. Haroldine Laws.  She will consult on the patient and determine whether or not patient requires admission [JK]    Clinical Course User Index [JK] Linwood Dibbles, MD                             Medical Decision Making Frontal diagnosis includes but not limited to acute blood loss, evolving sepsis, medication reaction, cardiac injury  Problems Addressed: Bradycardia: acute illness or injury that poses a threat to life or bodily functions Hypotension, unspecified hypotension type: acute illness or injury that poses a threat to life or bodily functions  Amount and/or Complexity of Data Reviewed Labs: ordered. Decision-making details documented in ED Course. Radiology: ordered.  Risk Prescription drug management. Decision regarding hospitalization.   Patient presented to the ED for evaluation of weakness and low blood pressure.  Patient was hypotensive and bradycardic on arrival.  Patient had taken his hypertensive medications this morning.  These do include metoprolol and Norvasc.  He is also on valsartan.  No signs of acute anemia.  Patient does have mild dehydration with slightly increased creatinine.  Consider the possibility of infection but at this time no signs of obvious source of infection.  He does not have a white count.  No urinary tract infection.  No pneumonia.  Lactic acid level is slightly elevated.  Will continue to monitor.  Patient has improved with IV fluids.  Think considering his degree of hypotension we will plan to bring him into the hospital for observation.  It is possible that this could be  related to his antihypertensive agents and we will need adjustment of his regimen        Final Clinical Impression(s) / ED Diagnoses Final diagnoses:   Hypotension, unspecified hypotension type  Bradycardia    Rx / DC Orders ED Discharge Orders     None         Linwood Dibbles, MD 10/25/22 2119

## 2022-10-25 NOTE — H&P (Signed)
PCP:   Mechele Claude, MD   Chief Complaint:  Hypertension  HPI: This is a 89 year old male with past medical history of HLD, HTN, A-fib, remote history of DVT/saddle PE.  Yesterday while in the kitchen he felt too weak, he thought he was someone make it back to the chair.  Blood pressure checked was 70s over 40s.  He went to urgent care.  By then his blood pressure normalized.  He was sent home with instructions that if that recurrent he should go to the ER.  Today he had a repeat episode.  After lunch he checked his blood pressure meds.  He started seeing dots and cloudy stuff.  Became mildly weak.  Sat in the chair BP blood pressure check revealed repeat blood pressures again in the 70s over 40s.  He denies being lightheaded or dizzy.  Denies fever, chills, nausea, vomiting, diarrhea, cough, shortness of breath or burning urination.  In the ER presenting blood pressure 78/44, pulse 45.  Lactic acid 2.1, creatinine 1.3.  Patient given 2 L IV fluids.  Vitals normalized.  Repeat lactic remained 2.1.  Observation requested  Review of Systems:  Per HPI  Past Medical History: Past Medical History:  Diagnosis Date   AAA (abdominal aortic aneurysm) (HCC) 2/14   3.8 cm 2/14   Arthritis    Atrial fibrillation (HCC) 2/14   acute pulmonary embolus   Colon polyps    Hyperlipidemia    Hypertension    Murmur, cardiac    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   LLE DVT   Past Surgical History:  Procedure Laterality Date   TONSILLECTOMY      Medications: Prior to Admission medications   Medication Sig Start Date End Date Taking? Authorizing Provider  albuterol (VENTOLIN HFA) 108 (90 Base) MCG/ACT inhaler Inhale 2 puffs into the lungs every 4 (four) hours as needed for wheezing or shortness of breath. 04/28/21   Burchette, Elberta Fortis, MD  amLODipine (NORVASC) 5 MG tablet Take 1 tablet (5 mg total) by mouth daily. For blood pressure 07/12/22   Mechele Claude, MD  apixaban (ELIQUIS) 5 MG TABS tablet  Take 1 tablet (5 mg total) by mouth 2 (two) times daily. 05/28/22   Mechele Claude, MD  dorzolamide-timolol (COSOPT) 22.3-6.8 MG/ML ophthalmic solution  10/26/15   [provider]  furosemide (LASIX) 40 MG tablet Take 1 tablet (40 mg total) by mouth daily. 05/28/22   Mechele Claude, MD  gabapentin (NEURONTIN) 300 MG capsule Take 3 capsules (900 mg total) by mouth 2 (two) times daily. 05/28/22   Mechele Claude, MD  latanoprost (XALATAN) 0.005 % ophthalmic solution  10/26/15   [provider]  lidocaine (LIDODERM) 5 % Place 1 patch onto the skin daily. Remove & Discard patch within 12 hours or as directed by MD    [provider]  lovastatin (MEVACOR) 40 MG tablet Take 1 tablet (40 mg total) by mouth at bedtime. 05/28/22   Mechele Claude, MD  metoprolol tartrate (LOPRESSOR) 50 MG tablet Take 0.5 tablets (25 mg total) by mouth 2 (two) times daily. 05/28/22   Mechele Claude, MD  potassium chloride (KLOR-CON) 10 MEQ tablet Take 1 tablet (10 mEq total) by mouth daily. 05/28/22   Mechele Claude, MD  tiZANidine (ZANAFLEX) 4 MG tablet Take 1 tablet (4 mg total) by mouth every 6 (six) hours as needed for muscle spasms. 05/29/22   Mechele Claude, MD  traZODone (DESYREL) 50 MG tablet Take 1 tablet (50 mg total) by mouth at  bedtime. 05/28/22   Mechele Claude, MD  valsartan (DIOVAN) 320 MG tablet Take 1 tablet (320 mg total) by mouth daily. For blood pressure. 07/12/22   Mechele Claude, MD    Allergies:   Allergies  Allergen Reactions   Diltiazem Rash    Social History:  reports that he quit smoking about 68 years ago. His smoking use included cigarettes. He has a 10.00 pack-year smoking history. He quit smokeless tobacco use about 58 years ago.  His smokeless tobacco use included chew. He reports that he does not drink alcohol and does not use drugs.  Family History: Family History  Problem Relation Age of Onset   Arthritis Mother    Heart disease Mother    Arthritis Father    Heart  disease Father    Hypertension Father    Congestive Heart Failure Sister    Congestive Heart Failure Sister    Stroke Brother    Congestive Heart Failure Brother    Heart disease Paternal Grandfather     Physical Exam: Vitals:   10/25/22 1815 10/25/22 1830 10/25/22 1838 10/25/22 1845  BP: (!) 155/77 (!) 142/83  (!) 149/80  Pulse: 63 70  80  Resp: 13 12  20   Temp:   98 F (36.7 C)   TempSrc:   Oral   SpO2: 100% 100%  94%  Weight:      Height:        General:  Alert and oriented times three, well developed and nourished, no acute distress Eyes: PERRLA, pink conjunctiva, no scleral icterus ENT: Moist oral mucosa, neck supple, no thyromegaly Lungs: clear to ascultation, no wheeze, no crackles, no use of accessory muscles Cardiovascular: regular rate and rhythm, no regurgitation, no gallops, no murmurs. No carotid bruits, no JVD Abdomen: soft, positive BS, non-tender, non-distended, no organomegaly, not an acute abdomen GU: not examined Neuro: CN II - XII grossly intact, sensation intact Musculoskeletal: strength 5/5 all extremities. >3+ B/L edema Skin: no rash, no subcutaneous crepitation, no decubitus Psych: appropriate patient    Labs on Admission:  Recent Labs    10/25/22 1458  NA 136  K 4.2  CL 103  CO2 24  GLUCOSE 174*  BUN 16  CREATININE 1.30*  CALCIUM 8.6*    Recent Labs    10/25/22 1458  WBC 6.7  NEUTROABS 3.6  HGB 12.1*  HCT 36.8*  MCV 100.8*  PLT 216    Radiological Exams on Admission: DG Chest Portable 1 View  Result Date: 10/25/2022 CLINICAL DATA:  Weakness. EXAM: PORTABLE CHEST 1 VIEW COMPARISON:  April 28, 2021. FINDINGS: Stable cardiomediastinal silhouette. Both lungs are clear. The visualized skeletal structures are unremarkable. IMPRESSION: No active disease. Electronically Signed   By: Lupita Raider M.D.   On: 10/25/2022 18:48    Assessment/Plan Present on Admission:  Hypotension likely secondary to meds -No evidence of  infection.  WBC 6.7.  Patient afebrile.  UA negative. -Hypotension responsive to IV fluid hydration.  However, patient with persistent lactic acidosis.  Give patient fluid overload, age, and lactic acidosis, EDP requested overnight observation and continued hydration   AKI/lactic acidosis -Gentle IV fluid hydration -Follow-up lactic acid, BMP in a.m. ordered -Lasix and valsartan on hold   Lower extremity fluid overload -Patient maintained on Lasix 40 mg daily.  On hold with AKI -Unclear etiology, 2D echo ordered   Hyperlipidemia -Losartan resumed   Hypertension -Norvasc, Lopressor, and valsartan on hold. -Patient may need to have medications adjusted to avoid hypotension and  bradycardia   Atrial fibrillation -Continue Eliquis twice daily -Lopressor on hold.  May need to decrease dosage on discharge   History of saddle PE  AAA  Jacere Pangborn 10/25/2022, 8:10 PM

## 2022-10-25 NOTE — ED Triage Notes (Signed)
Patient arrives for eval of weakness x 2 days. Reports going to UC yesterday for same symptoms and did not receive any tx. Checked his blood pressure today and was 80s systolic. Denies sickness and reports normal PO intake.

## 2022-10-25 NOTE — ED Notes (Signed)
Lactic reported at 2.1. Provider made aware.

## 2022-10-25 NOTE — ED Notes (Signed)
ED TO INPATIENT HANDOFF REPORT  ED Nurse Name and Phone #: Minerva Areola [phone]  B Name/Age/Gender Jerry Fox 89 y.o. male Room/Bed: 027C/027C  Code Status   Code Status: Prior  Home/SNF/Other Home Patient oriented to: self, place, time, and situation Is this baseline? Yes   Triage Complete: Triage complete  Chief Complaint Hypotension [I95.9]  Triage Note Patient arrives for eval of weakness x 2 days. Reports going to UC yesterday for same symptoms and did not receive any tx. Checked his blood pressure today and was 80s systolic. Denies sickness and reports normal PO intake.    Allergies Allergies  Allergen Reactions   Diltiazem Rash    Level of Care/Admitting Diagnosis ED Disposition     ED Disposition  Admit   Condition  --   Comment  Hospital Area: [facility] [[id number]]  Level of Care: Telemetry Medical [104]  May place patient in observation at [facility] or [facility] if equivalent level of care is available:: Yes  Covid Evaluation: Confirmed COVID Negative  Diagnosis: Hypotension [284132]  Admitting Physician: Gery Pray [[id number]  Attending Physician: Alvester Chou          B Medical/Surgery History Past Medical History:  Diagnosis Date   AAA (abdominal aortic aneurysm) (HCC) 2/14   3.8 cm 2/14   Arthritis    Atrial fibrillation (HCC) 2/14   acute pulmonary embolus   Colon polyps    Hyperlipidemia    Hypertension    Murmur, cardiac    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   LLE DVT   Past Surgical History:  Procedure Laterality Date   TONSILLECTOMY       A IV Location/Drains/Wounds Patient Lines/Drains/Airways Status     Active Line/Drains/Airways     Name Placement date Placement time Site Days   Peripheral IV 10/25/22 20 G Posterior;Right Hand 10/25/22  1455  Hand  less than 1   Peripheral IV 10/25/22 20 G Left;Posterior Hand 10/25/22  1615  Hand  less than 1            Intake/Output Last 24  hours  Intake/Output Summary (Last 24 hours) at 10/25/2022 2109 Last data filed at 10/25/2022 4401 Gross per 24 hour  Intake 3000 ml  Output 1000 ml  Net 2000 ml    Labs/Imaging Results for orders placed or performed during the hospital encounter of 10/25/22 (from the past 48 hour(s))  Basic metabolic panel     Status: Abnormal   Collection Time: 10/25/22  2:58 PM  Result Value Ref Range   Sodium 136 135 - 145 mmol/L   Potassium 4.2 3.5 - 5.1 mmol/L   Chloride 103 98 - 111 mmol/L   CO2 24 22 - 32 mmol/L   Glucose, Bld 174 (H) 70 - 99 mg/dL    Comment: Glucose reference range applies only to samples taken after fasting for at least 8 hours.   BUN 16 8 - 23 mg/dL   Creatinine, Ser 0.27 (H) 0.61 - 1.24 mg/dL   Calcium 8.6 (L) 8.9 - 10.3 mg/dL   GFR, Estimated 54 (L) >60 mL/min    Comment: (NOTE) Calculated using the CKD-EPI Creatinine Equation (2021)    Anion gap 9 5 - 15    Comment: Performed at [facility], [address], [place], [place] [address]  CBC with Differential     Status: Abnormal   Collection Time: 10/25/22  2:58 PM  Result Value Ref Range   WBC 6.7 4.0 -  10.5 K/uL   RBC 3.65 (L) 4.22 - 5.81 MIL/uL   Hemoglobin 12.1 (L) 13.0 - 17.0 g/dL   HCT 10.2 (L) 72.5 - 36.6 %   MCV 100.8 (H) 80.0 - 100.0 fL   MCH 33.2 26.0 - 34.0 pg   MCHC 32.9 30.0 - 36.0 g/dL   RDW 44.0 34.7 - 42.5 %   Platelets 216 150 - 400 K/uL   nRBC 0.0 0.0 - 0.2 %   Neutrophils Relative % 53 %   Neutro Abs 3.6 1.7 - 7.7 K/uL   Lymphocytes Relative 36 %   Lymphs Abs 2.4 0.7 - 4.0 K/uL   Monocytes Relative 9 %   Monocytes Absolute 0.6 0.1 - 1.0 K/uL   Eosinophils Relative 1 %   Eosinophils Absolute 0.1 0.0 - 0.5 K/uL   Basophils Relative 1 %   Basophils Absolute 0.0 0.0 - 0.1 K/uL   Immature Granulocytes 0 %   Abs Immature Granulocytes 0.02 0.00 - 0.07 K/uL    Comment: Performed at [facility], [address], [place], [place] [address]  Troponin I (High Sensitivity)      Status: None   Collection Time: 10/25/22  2:58 PM  Result Value Ref Range   Troponin I (High Sensitivity) 7 <18 ng/L    Comment: (NOTE) Elevated high sensitivity troponin I (hsTnI) values and significant  changes across serial measurements may suggest ACS but many other  chronic and acute conditions are known to elevate hsTnI results.  Refer to the "Links" section for chest pain algorithms and additional  guidance. Performed at [facility], [address], [place], [place] [address]   CBG monitoring, ED     Status: Abnormal   Collection Time: 10/25/22  3:20 PM  Result Value Ref Range   Glucose-Capillary 131 (H) 70 - 99 mg/dL    Comment: Glucose reference range applies only to samples taken after fasting for at least 8 hours.   Comment 1 Notify RN    Comment 2 Document in Chart   Urinalysis, Routine w reflex microscopic -Urine, Clean Catch     Status: None   Collection Time: 10/25/22  3:35 PM  Result Value Ref Range   Color, Urine YELLOW YELLOW   APPearance CLEAR CLEAR   Specific Gravity, Urine 1.006 1.005 - 1.030   pH 5.0 5.0 - 8.0   Glucose, UA NEGATIVE NEGATIVE mg/dL   Hgb urine dipstick NEGATIVE NEGATIVE   Bilirubin Urine NEGATIVE NEGATIVE   Ketones, ur NEGATIVE NEGATIVE mg/dL   Protein, ur NEGATIVE NEGATIVE mg/dL   Nitrite NEGATIVE NEGATIVE   Leukocytes,Ua NEGATIVE NEGATIVE    Comment: Performed at [facility], [address] [place], [place] [address]  Lactic acid, plasma     Status: Abnormal   Collection Time: 10/25/22  4:16 PM  Result Value Ref Range   Lactic Acid, Venous 2.1 (HH) 0.5 - 1.9 mmol/L    Comment: CRITICAL RESULT CALLED TO, READ BACK BY AND VERIFIED WITH L CARSON RN 10/25/2022 1727 BNUNNERY Performed at [facility], [address] [place], [place] [address]   Lactic acid, plasma     Status: Abnormal   Collection Time: 10/25/22  6:30 PM  Result Value Ref Range   Lactic Acid, Venous 2.1 (HH) 0.5 - 1.9 mmol/L    Comment: CRITICAL  VALUE NOTED.  VALUE IS CONSISTENT WITH PREVIOUSLY REPORTED AND CALLED VALUE. Performed at [facility], [address], [place], [place] [address]  DG Chest Portable 1 View  Result Date: 10/25/2022 CLINICAL DATA:  Weakness. EXAM: PORTABLE CHEST 1 VIEW COMPARISON:  April 28, 2021. FINDINGS: Stable cardiomediastinal silhouette. Both lungs are clear. The visualized skeletal structures are unremarkable. IMPRESSION: No active disease. Electronically Signed   By: Lupita Raider M.D.   On: 10/25/2022 18:48    Pending Labs Unresulted Labs (From admission, onward)     Start     Ordered   10/25/22 2108  Brain natriuretic peptide  Once,   R        10/25/22 2107   10/25/22 1929  Basic metabolic panel  Once,   STAT        10/25/22 1929   10/25/22 1530  Blood culture (routine x 2)  BLOOD CULTURE X 2,   R (with STAT occurrences)      10/25/22 1529            Vitals/Pain Today's Vitals   10/25/22 1815 10/25/22 1830 10/25/22 1838 10/25/22 1845  BP: (!) 155/77 (!) 142/83  (!) 149/80  Pulse: 63 70  80  Resp: 13 12  20   Temp:   98 F (36.7 C)   TempSrc:   Oral   SpO2: 100% 100%  94%  Weight:      Height:      PainSc:        Isolation Precautions No active isolations  Medications Medications  sodium chloride 0.9 % bolus 1,000 mL (0 mLs Intravenous Stopped 10/25/22 1826)    Followed by  0.9 %  sodium chloride infusion (0 mLs Intravenous Stopped 10/25/22 1826)  0.9 %  sodium chloride infusion ( Intravenous New Bag/Given 10/25/22 2002)  lactated ringers bolus 1,000 mL (0 mLs Intravenous Stopped 10/25/22 1713)    Mobility walks     Focused Assessments Cardiac Assessment Handoff:    Lab Results  Component Value Date   TROPONINI <0.30 06/14/2012   No results found for: "DDIMER" Does the Patient currently have chest pain? No    R Recommendations: See Admitting Provider Note  Report given to:   Additional Notes: pt wheezing, o2 sats down w/ambulation, a and o x 4

## 2022-10-26 ENCOUNTER — Observation Stay (HOSPITAL_BASED_OUTPATIENT_CLINIC_OR_DEPARTMENT_OTHER): Payer: Medicare PPO

## 2022-10-26 ENCOUNTER — Other Ambulatory Visit (HOSPITAL_COMMUNITY): Payer: Self-pay

## 2022-10-26 DIAGNOSIS — R600 Localized edema: Secondary | ICD-10-CM | POA: Diagnosis not present

## 2022-10-26 DIAGNOSIS — I509 Heart failure, unspecified: Secondary | ICD-10-CM

## 2022-10-26 DIAGNOSIS — E872 Acidosis, unspecified: Secondary | ICD-10-CM | POA: Diagnosis not present

## 2022-10-26 DIAGNOSIS — N179 Acute kidney failure, unspecified: Secondary | ICD-10-CM | POA: Diagnosis not present

## 2022-10-26 DIAGNOSIS — I959 Hypotension, unspecified: Secondary | ICD-10-CM | POA: Diagnosis not present

## 2022-10-26 DIAGNOSIS — I480 Paroxysmal atrial fibrillation: Secondary | ICD-10-CM | POA: Diagnosis not present

## 2022-10-26 DIAGNOSIS — Z79899 Other long term (current) drug therapy: Secondary | ICD-10-CM | POA: Diagnosis not present

## 2022-10-26 DIAGNOSIS — Z7901 Long term (current) use of anticoagulants: Secondary | ICD-10-CM | POA: Diagnosis not present

## 2022-10-26 DIAGNOSIS — E861 Hypovolemia: Secondary | ICD-10-CM | POA: Diagnosis not present

## 2022-10-26 DIAGNOSIS — Z86711 Personal history of pulmonary embolism: Secondary | ICD-10-CM | POA: Diagnosis not present

## 2022-10-26 DIAGNOSIS — Z86718 Personal history of other venous thrombosis and embolism: Secondary | ICD-10-CM | POA: Diagnosis not present

## 2022-10-26 LAB — BASIC METABOLIC PANEL
Anion gap: 15 (ref 5–15)
BUN: 12 mg/dL (ref 8–23)
CO2: 23 mmol/L (ref 22–32)
Calcium: 8.8 mg/dL — ABNORMAL LOW (ref 8.9–10.3)
Chloride: 103 mmol/L (ref 98–111)
Creatinine, Ser: 1.08 mg/dL (ref 0.61–1.24)
GFR, Estimated: >60 mL/min (ref >60)
Glucose, Bld: 129 mg/dL — ABNORMAL HIGH (ref 70–99)
Potassium: 4.3 mmol/L (ref 3.5–5.1)
Sodium: 141 mmol/L (ref 135–145)

## 2022-10-26 LAB — CBC WITH DIFFERENTIAL/PLATELET
Abs Immature Granulocytes: 0.04 10*3/uL (ref 0.00–0.07)
Basophils Absolute: 0 10*3/uL (ref 0.0–0.1)
Basophils Relative: 0 %
Eosinophils Absolute: 0.1 10*3/uL (ref 0.0–0.5)
Eosinophils Relative: 1 %
HCT: 37.8 % — ABNORMAL LOW (ref 39.0–52.0)
Hemoglobin: 12.5 g/dL — ABNORMAL LOW (ref 13.0–17.0)
Immature Granulocytes: 0 %
Lymphocytes Relative: 18 %
Lymphs Abs: 1.9 10*3/uL (ref 0.7–4.0)
MCH: 32.5 pg (ref 26.0–34.0)
MCHC: 33.1 g/dL (ref 30.0–36.0)
MCV: 98.2 fL (ref 80.0–100.0)
Monocytes Absolute: 0.8 10*3/uL (ref 0.1–1.0)
Monocytes Relative: 8 %
Neutro Abs: 7.4 10*3/uL (ref 1.7–7.7)
Neutrophils Relative %: 73 %
Platelets: 219 10*3/uL (ref 150–400)
RBC: 3.85 MIL/uL — ABNORMAL LOW (ref 4.22–5.81)
RDW: 13.2 % (ref 11.5–15.5)
WBC: 10.2 10*3/uL (ref 4.0–10.5)
nRBC: 0 % (ref 0.0–0.2)

## 2022-10-26 LAB — LACTIC ACID, PLASMA: Lactic Acid, Venous: 1.7 mmol/L (ref 0.5–1.9)

## 2022-10-26 LAB — ECHOCARDIOGRAM COMPLETE
AR max vel: 2.8 cm2
AV Area VTI: 2.66 cm2
AV Area mean vel: 2.65 cm2
AV Mean grad: 6.5 mmHg
AV Peak grad: 11 mmHg
Ao pk vel: 1.66 m/s
Area-P 1/2: 4.19 cm2
Height: 70 in
S' Lateral: 2.6 cm
Weight: 3841.3 oz

## 2022-10-26 LAB — BRAIN NATRIURETIC PEPTIDE: B Natriuretic Peptide: 159.7 pg/mL — ABNORMAL HIGH (ref 0.0–100.0)

## 2022-10-26 MED ORDER — GABAPENTIN 400 MG PO CAPS
400.0000 mg | ORAL_CAPSULE | Freq: Two times a day (BID) | ORAL | 0 refills | Status: DC
  Filled 2022-10-26: qty 60, 30d supply, fill #0

## 2022-10-26 MED ORDER — ALBUTEROL SULFATE HFA 108 (90 BASE) MCG/ACT IN AERS: 2.0000 | INHALATION_SPRAY | RESPIRATORY_TRACT | Status: DC | PRN

## 2022-10-26 MED ORDER — METOPROLOL TARTRATE 25 MG PO TABS
12.5000 mg | ORAL_TABLET | Freq: Two times a day (BID) | ORAL | 0 refills | Status: DC
  Filled 2022-10-26: qty 30, 30d supply, fill #0

## 2022-10-26 MED ORDER — GABAPENTIN 300 MG PO CAPS: 300.0000 mg | ORAL_CAPSULE | Freq: Three times a day (TID) | ORAL | Status: DC

## 2022-10-26 MED ORDER — HALOPERIDOL LACTATE 5 MG/ML IJ SOLN: 2.0000 mg | Freq: Once | INTRAMUSCULAR | Status: DC | PRN

## 2022-10-26 MED ORDER — GABAPENTIN 300 MG PO CAPS: 900.0000 mg | ORAL_CAPSULE | Freq: Two times a day (BID) | ORAL | Status: DC

## 2022-10-26 MED ORDER — ALBUTEROL SULFATE HFA 108 (90 BASE) MCG/ACT IN AERS
2.0000 | INHALATION_SPRAY | RESPIRATORY_TRACT | 0 refills | Status: DC | PRN
  Filled 2022-10-26: qty 18, 30d supply, fill #0

## 2022-10-26 MED ORDER — POTASSIUM CHLORIDE ER 10 MEQ PO TBCR
10.0000 meq | EXTENDED_RELEASE_TABLET | Freq: Every day | ORAL | Status: DC
  Filled 2022-10-26: qty 1

## 2022-10-26 MED ORDER — ALBUTEROL SULFATE HFA 108 (90 BASE) MCG/ACT IN AERS: 2.0000 | INHALATION_SPRAY | Freq: Four times a day (QID) | RESPIRATORY_TRACT | Status: DC | PRN

## 2022-10-26 MED ORDER — TRAZODONE HCL 50 MG PO TABS: 50.0000 mg | ORAL_TABLET | Freq: Every day | ORAL | Status: DC

## 2022-10-26 MED ORDER — GABAPENTIN 400 MG PO CAPS: 400.0000 mg | ORAL_CAPSULE | Freq: Two times a day (BID) | ORAL | Status: DC

## 2022-10-26 MED ORDER — PRAVASTATIN SODIUM 10 MG PO TABS
10.0000 mg | ORAL_TABLET | Freq: Every day | ORAL | Status: DC
  Administered 2022-10-26: 10 mg via ORAL
  Filled 2022-10-26: qty 1

## 2022-10-26 MED ORDER — LATANOPROST 0.005 % OP SOLN
1.0000 [drp] | Freq: Every day | OPHTHALMIC | Status: DC
  Filled 2022-10-26: qty 2.5

## 2022-10-26 MED ORDER — TIZANIDINE HCL 4 MG PO TABS: 2.0000 mg | ORAL_TABLET | Freq: Four times a day (QID) | ORAL | Status: DC | PRN

## 2022-10-26 MED ORDER — DORZOLAMIDE HCL-TIMOLOL MAL 2-0.5 % OP SOLN
1.0000 [drp] | Freq: Two times a day (BID) | OPHTHALMIC | Status: DC
  Filled 2022-10-26: qty 10

## 2022-10-26 MED ORDER — LORATADINE 10 MG PO TABS
10.0000 mg | ORAL_TABLET | Freq: Every day | ORAL | 0 refills | Status: DC
  Filled 2022-10-26: qty 100, 100d supply, fill #0

## 2022-10-26 MED ORDER — METOPROLOL TARTRATE 25 MG PO TABS: 25.0000 mg | ORAL_TABLET | Freq: Two times a day (BID) | ORAL | Status: DC

## 2022-10-26 MED ORDER — LORATADINE 10 MG PO TABS
10.0000 mg | ORAL_TABLET | Freq: Every day | ORAL | Status: DC
  Administered 2022-10-26: 10 mg via ORAL
  Filled 2022-10-26: qty 1

## 2022-10-26 MED ORDER — ALBUTEROL SULFATE (2.5 MG/3ML) 0.083% IN NEBU
3.0000 mL | INHALATION_SOLUTION | RESPIRATORY_TRACT | Status: DC | PRN
  Administered 2022-10-26: 3 mL via RESPIRATORY_TRACT
  Filled 2022-10-26: qty 3

## 2022-10-26 MED ORDER — METOPROLOL TARTRATE 12.5 MG HALF TABLET: 12.5000 mg | ORAL_TABLET | Freq: Two times a day (BID) | ORAL | Status: DC

## 2022-10-26 MED ORDER — TRIAMCINOLONE ACETONIDE 0.1 % EX CREA
1.0000 | TOPICAL_CREAM | Freq: Two times a day (BID) | CUTANEOUS | Status: DC
  Filled 2022-10-26: qty 15

## 2022-10-26 MED ORDER — APIXABAN 5 MG PO TABS: 5.0000 mg | ORAL_TABLET | Freq: Two times a day (BID) | ORAL | Status: DC

## 2022-10-26 NOTE — Plan of Care (Signed)

## 2022-10-26 NOTE — Evaluation (Signed)
Physical Therapy Evaluation and D/C Patient Details Name: Jerry Fox MRN: [id number] DOB: 03-Nov-1933 Today's Date: 10/26/2022  History of Present Illness  This is a 89 year old male admitted 6/27 with hypotension.  PMH: HLD, HTN, A-fib, remote history of DVT/saddle PE.  Clinical Impression  Pt admitted with above diagnosis. Pt was able to ambulate with RW with Modif I with good safety awareness.  Pt also can ambulate in room without RW.  No LOB.  Wife present.  Pt has been confused intermittently during this hospitalization and has sitter currently. Wife can assist pt at home prn. Should d/c home today.  Pt currently with functional limitations due to the deficits listed below (see PT Problem List). Plan is for pt to d/c home today.     Orthostatic BPs   Supine Pt on EOB on arrival  Sitting 164/81, 96 bpm  Standing 156/78, 102 bpm  Standing after 3 min 173/68, 101 bpm  No dizziness reported.  O2 sats on RA 95-98% with activity. Pt DOE 3/4 and encouraged pursed lip breathing  Recommendations for follow up therapy are one component of a multi-disciplinary discharge planning process, led by the attending physician.  Recommendations may be updated based on patient status, additional functional criteria and insurance authorization.  Follow Up Recommendations       Assistance Recommended at Discharge PRN  Patient can return home with the following       Equipment Recommendations None recommended by PT  Recommendations for Other Services       Functional Status Assessment Patient has not had a recent decline in their functional status     Precautions / Restrictions Precautions Precautions: None Restrictions Weight Bearing Restrictions: No      Mobility  Bed Mobility Overal bed mobility: Independent                  Transfers Overall transfer level: Independent                      Ambulation/Gait Ambulation/Gait assistance: Modified independent  (Device/Increase time) Gait Distance (Feet): 200 Feet Assistive device: Rolling walker (2 wheels), None Gait Pattern/deviations: Step-through pattern, Decreased stride length   Gait velocity interpretation: <1.31 ft/sec, indicative of household ambulator   General Gait Details: Pt walking in room with and without RW without LOB.  States he uses Rw at home when needed.  Stairs            Wheelchair Mobility    Modified Rankin (Stroke Patients Only)       Balance Overall balance assessment: Needs assistance Sitting-balance support: No upper extremity supported, Feet supported Sitting balance-Leahy Scale: Fair     Standing balance support: No upper extremity supported, During functional activity, Bilateral upper extremity supported Standing balance-Leahy Scale: Fair Standing balance comment: Pt stands statically at sink to wash his hands with good balance.  Appears to need RW for support with longer distances.                             Pertinent Vitals/Pain Pain Assessment Pain Assessment: No/denies pain    Home Living Family/patient expects to be discharged to:: Private residence Living Arrangements: Spouse/significant other;Other relatives Available Help at Discharge: Family;Available 24 hours/day Type of Home: House Home Access: Stairs to enter Entrance Stairs-Rails: Left Entrance Stairs-Number of Steps: 4   Home Layout: One level Home Equipment: Agricultural consultant (2 wheels);BSC/3in1;Shower seat      Prior  Function Prior Level of Function : Independent/Modified Independent;History of Falls (last six months)             Mobility Comments: wife states pt would use RW and sometimes wouldnt use it. ADLs Comments: I B/D PTA     Hand Dominance        Extremity/Trunk Assessment   Upper Extremity Assessment Upper Extremity Assessment: Overall WFL for tasks assessed    Lower Extremity Assessment Lower Extremity Assessment: Overall WFL for  tasks assessed    Cervical / Trunk Assessment Cervical / Trunk Assessment: Normal  Communication   Communication: No difficulties  Cognition Arousal/Alertness: Awake/alert Behavior During Therapy: WFL for tasks assessed/performed Overall Cognitive Status: Within Functional Limits for tasks assessed                                 General Comments: Has been confused in hospital        General Comments General comments (skin integrity, edema, etc.): VSS, BP slightly high    Exercises     Assessment/Plan    PT Assessment Patient does not need any further PT services  PT Problem List         PT Treatment Interventions      PT Goals (Current goals can be found in the Care Plan section)  Acute Rehab PT Goals Patient Stated Goal: to go home today PT Goal Formulation: All assessment and education complete, DC therapy    Frequency       Co-evaluation               AM-PAC PT "6 Clicks" Mobility  Outcome Measure Help needed turning from your back to your side while in a flat bed without using bedrails?: None Help needed moving from lying on your back to sitting on the side of a flat bed without using bedrails?: None Help needed moving to and from a bed to a chair (including a wheelchair)?: None Help needed standing up from a chair using your arms (e.g., wheelchair or bedside chair)?: None Help needed to walk in hospital room?: None Help needed climbing 3-5 steps with a railing? : None 6 Click Score: 24    End of Session Equipment Utilized During Treatment: Gait belt Activity Tolerance: Patient tolerated treatment well Patient left: in bed;with call bell/phone within reach;with bed alarm set;with family/visitor present;with nursing/sitter in room Nurse Communication: Mobility status PT Visit Diagnosis: Muscle weakness (generalized) (M62.81)    Time: 1610-9604 PT Time Calculation (min) (ACUTE ONLY): 24 min   Charges:   PT Evaluation $PT Eval  Moderate Complexity: 1 Mod PT Treatments $Gait Training: 8-22 mins        [facility] M,PT Acute Rehab Services [phone]   Bevelyn Buckles 10/26/2022, 12:58 PM

## 2022-10-26 NOTE — Progress Notes (Signed)
OT Cancellation Note  Patient Details Name: Jerry Fox MRN: [id number] DOB: June 29, 1933   Cancelled Treatment:    Reason Eval/Treat Not Completed: OT screened, no needs identified, will sign off  Suzanna Obey 10/26/2022, 12:43 PM 10/26/2022  RP, OTR/L  Acute Rehabilitation Services  Office:  [phone]

## 2022-10-26 NOTE — Progress Notes (Signed)
2D Echocardiogram performed.  

## 2022-10-26 NOTE — Discharge Summary (Signed)
Physician Discharge Summary  Jerry Fox NGE:[id number] DOB: 07-19-1933 DOA: 10/25/2022  PCP: Mechele Claude, MD  Admit date: 10/25/2022 Discharge date: 10/26/2022 Recommendations for Outpatient Follow-up:  Follow up with PCP in 1 weeks-call for appointment Please obtain BMP/CBC in one week Follow-up with PCP regarding blood pressure monitoring and medication adjustment  Discharge Dispo: [facility] Discharge Condition: Stable Code Status:   Code Status: DNR Diet recommendation:  Diet Order             Diet Heart Room service appropriate? Yes; Fluid consistency: Thin  Diet effective now                    Brief/Interim Summary: 88-yom w/ HLD, HTN, A-fib, remote history of DVT/saddle PE who felt too weak at kitchen 10/24/22, and his BP was in 70s over 40s, so went to UC0 bp had normalized then was sent home with instructions that if that recurrent he should go to the ER. Again 6/27 he had a repeat episode.  After lunch he checked his blood pressure meds.  He started seeing dots and cloudy stuff.  Became mildly weak.  Sat in the chair BP blood pressure check revealed repeat blood pressures again in the 70s over 40s.  He denies being lightheaded or dizzy.  Denies fever, chills, nausea, vomiting, diarrhea, cough, shortness of breath or burning urination.  In the ER presenting blood pressure 78/44, pulse 45.  Lactic acid 2.1, creatinine 1.3 given 2 L IV fluid bolus BP stabilized lactic acid remained high so admission requested.  Chest x-ray no active disease. UA was unremarkable blood culture was sent empirically. BNP 159. With further hydration lactic acid has normalized to 1.7 CBC remained stable without leukocytosis renal function is stable improved from 1.3-1.0, no recurrence of hypotension Overnight patient has been wanting to get up and walk around, Echocardiogram ordered for lower extremity edema which is chronic.  Obtained PT OT evaluation and he did well, no orthostatic hypotension  noted , no dizziness.  Denies cough or respiratory symptoms focal weakness dizziness headache.  He feels ready for discharge.  PT OT advised home health.    Discharge Diagnoses:  Principal Problem:   Hypotension Active Problems:   Hyperlipidemia   Hypertension   AKI (acute kidney injury) (HCC)   Hypotension due to medication   Paroxysmal atrial fibrillation (HCC)   History of pulmonary embolism  Hypotension: Resolved, unclear etiology likely secondary to medication.  No evidence of infection patient afebrile UA negative chest x-ray with no active disease.  Blood pressure has normalized after fluid boluses and holding antihypertensive.  Blood cultures no growth since admission.  Worked with PT OT orthostatic vitals negative and did well.  Will resume low-dose beta-blocker for his A-fib Continue to hold his Norvasc and valsartan and follow-up with PCP to reassess Echo is stable no valve issues. AKI Lactic acidosis: Suspect hemodynamics mediated due to low blood pressure, at this time it has resolved with IV fluids.  Lower leg edema chronic, echo ordered to evaluate.  He is on Lasix at home can resume upon discharge with close follow-up with PCP  Hyperlipidemia continue statin  History of saddle PE continue his anticoagulation AAA continue to follow-up with PCP and cardiology He is on chronic neurontin -dose decreased form 900 mg bid to 400 mg bid- fu w/ PCP to reasses  Consults: none Subjective: Alert awake oriented resting comfortably no complaints  Discharge Exam: Vitals:   10/26/22 0434 10/26/22 1119  BP: (!) 146/68 Marland Kitchen)  146/66  Pulse: 78   Resp: 16   Temp: 98 F (36.7 C)   SpO2: 98%    General: Pt is alert, awake, not in acute distress Cardiovascular: RRR, S1/S2 +, no rubs, no gallops Respiratory: CTA bilaterally, no wheezing, no rhonchi Abdominal: Soft, NT, ND, bowel sounds + Extremities: no edema, no cyanosis  Discharge Instructions  Discharge Instructions      Discharge instructions   Complete by: As directed    Monitor blood pressure at home, her antihypertensive amlodipine/Norvasc and valsartan has been held dose of metoprolol has been decreased. Follow-up with primary care doctor for further blood pressure medication adjustment  Please call call MD or return to ER for similar or worsening recurring problem that brought you to hospital or if any fever,nausea/vomiting,abdominal pain, uncontrolled pain, chest pain,  shortness of breath or any other alarming symptoms.  Please follow-up your doctor as instructed in a week time and call the office for appointment.  Please avoid alcohol, smoking, or any other illicit substance and maintain healthy habits including taking your regular medications as prescribed.  You were cared for by a hospitalist during your hospital stay. If you have any questions about your discharge medications or the care you received while you were in the hospital after you are discharged, you can call the unit and ask to speak with the hospitalist on call if the hospitalist that took care of you is not available.  Once you are discharged, your primary care physician will handle any further medical issues. Please note that NO REFILLS for any discharge medications will be authorized once you are discharged, as it is imperative that you return to your primary care physician (or establish a relationship with a primary care physician if you do not have one) for your aftercare needs so that they can reassess your need for medications and monitor your lab values   Increase activity slowly   Complete by: As directed       Allergies as of 10/26/2022       Reactions   Diltiazem Rash        Medication List     STOP taking these medications    amLODipine 5 MG tablet Commonly known as: NORVASC   tiZANidine 4 MG tablet Commonly known as: ZANAFLEX   valsartan 320 MG tablet Commonly known as: DIOVAN       TAKE these  medications    acetaminophen 500 MG tablet Commonly known as: TYLENOL Take 1,000 mg by mouth 2 (two) times daily.   apixaban 5 MG Tabs tablet Commonly known as: ELIQUIS Take 1 tablet (5 mg total) by mouth 2 (two) times daily.   dorzolamide-timolol 2-0.5 % ophthalmic solution Commonly known as: COSOPT Place 1 drop into both eyes 2 (two) times daily.   furosemide 40 MG tablet Commonly known as: LASIX Take 1 tablet (40 mg total) by mouth daily.   gabapentin 400 MG capsule Commonly known as: NEURONTIN Take 1 capsule (400 mg total) by mouth 2 (two) times daily. What changed:  medication strength how much to take   latanoprost 0.005 % ophthalmic solution Commonly known as: XALATAN Place 1 drop into both eyes at bedtime.   loratadine 10 MG tablet Commonly known as: CLARITIN Take 1 tablet (10 mg total) by mouth daily for 7 days, then as needed thereafter. Start taking on: October 27, 2022   lovastatin 40 MG tablet Commonly known as: MEVACOR Take 1 tablet (40 mg total) by mouth at bedtime.  metoprolol tartrate 25 MG tablet Commonly known as: LOPRESSOR Take 0.5 tablets (12.5 mg total) by mouth 2 (two) times daily. What changed:  medication strength how much to take   potassium chloride 10 MEQ tablet Commonly known as: KLOR-CON Take 1 tablet (10 mEq total) by mouth daily.   traZODone 50 MG tablet Commonly known as: DESYREL Take 1 tablet (50 mg total) by mouth at bedtime.   triamcinolone cream 0.1 % Commonly known as: KENALOG Apply 1 Application topically 2 (two) times daily. to affected area(s)   Ventolin HFA 108 (90 Base) MCG/ACT inhaler Generic drug: albuterol Inhale 2 puffs into the lungs every 4 (four) hours as needed for wheezing or shortness of breath.        Follow-up Information     Mechele Claude, MD Follow up in 1 week(s).   Specialty: Family Medicine Contact information: [address] [place] [address] [phone]                 Allergies  Allergen Reactions   Diltiazem Rash    The results of significant diagnostics from this hospitalization (including imaging, microbiology, ancillary and laboratory) are listed below for reference.    Microbiology: Recent Results (from the past 240 hour(s))  Blood culture (routine x 2)     Status: None (Preliminary result)   Collection Time: 10/25/22  3:54 PM   Specimen: BLOOD LEFT HAND  Result Value Ref Range Status   Specimen Description BLOOD LEFT HAND  Final   Special Requests   Final    BOTTLES DRAWN AEROBIC AND ANAEROBIC Blood Culture adequate volume   Culture   Final    NO GROWTH < 24 HOURS Performed at [facility], [address], [place], [place] [address]    Report Status PENDING  Incomplete  Blood culture (routine x 2)     Status: None (Preliminary result)   Collection Time: 10/25/22  4:16 PM   Specimen: BLOOD LEFT HAND  Result Value Ref Range Status   Specimen Description BLOOD LEFT HAND  Final   Special Requests   Final    BOTTLES DRAWN AEROBIC AND ANAEROBIC Blood Culture results may not be optimal due to an inadequate volume of blood received in culture bottles   Culture   Final    NO GROWTH < 24 HOURS Performed at [facility], [address] [place], [place] [address]    Report Status PENDING  Incomplete    Procedures/Studies: ECHOCARDIOGRAM COMPLETE  Result Date: 10/26/2022    ECHOCARDIOGRAM REPORT   Patient Name:   NASSIR QUINTIN Date of Exam: 10/26/2022 Medical Rec #:  [id number]         Height:       70.0 in Accession #:    [id number]        Weight:       240.1 lb Date of Birth:  [birth year]         BSA:          2.256 m Patient Age:    88 years          BP:           146/66 mmHg Patient Gender: M                 HR:           95 bpm. Exam Location:  Inpatient Procedure: 2D Echo, Cardiac Doppler and Color Doppler Indications:    CHF  History:  Patient has prior history of Echocardiogram examinations and                 Patient  has no prior history of Echocardiogram examinations.                 Arrythmias:Atrial Fibrillation; Risk Factors:Hypertension and                 Dyslipidemia.  Sonographer:    Meagan Baucom RDCS, FE, PE Referring Phys: [id number] DEBBY CROSLEY IMPRESSIONS  1. Left ventricular ejection fraction, by estimation, is 60 to 65%. The left ventricle has normal function. The left ventricle has no regional wall motion abnormalities. Left ventricular diastolic parameters are indeterminate.  2. Right ventricular systolic function is normal. The right ventricular size is normal.  3. The mitral valve is normal in structure. Trivial mitral valve regurgitation.  4. The aortic valve is tricuspid. Aortic valve regurgitation is trivial. Aortic valve sclerosis is present, with no evidence of aortic valve stenosis. FINDINGS  Left Ventricle: Left ventricular ejection fraction, by estimation, is 60 to 65%. The left ventricle has normal function. The left ventricle has no regional wall motion abnormalities. The left ventricular internal cavity size was normal in size. There is  no left ventricular hypertrophy. Left ventricular diastolic parameters are indeterminate. Right Ventricle: The right ventricular size is normal. Right vetricular wall thickness was not assessed. Right ventricular systolic function is normal. Left Atrium: Left atrial size was normal in size. Right Atrium: Right atrial size was normal in size. Pericardium: There is no evidence of pericardial effusion. Mitral Valve: The mitral valve is normal in structure. Trivial mitral valve regurgitation. Tricuspid Valve: The tricuspid valve is normal in structure. Tricuspid valve regurgitation is trivial. Aortic Valve: The aortic valve is tricuspid. Aortic valve regurgitation is trivial. Aortic valve sclerosis is present, with no evidence of aortic valve stenosis. Aortic valve mean gradient measures 6.5 mmHg. Aortic valve peak gradient measures 11.0 mmHg.  Aortic valve area, by VTI  measures 2.66 cm. Pulmonic Valve: The pulmonic valve was normal in structure. Pulmonic valve regurgitation is not visualized. Aorta: The aortic root and ascending aorta are structurally normal, with no evidence of dilitation. IAS/Shunts: No atrial level shunt detected by color flow Doppler.  LEFT VENTRICLE PLAX 2D LVIDd:         3.80 cm   Diastology LVIDs:         2.60 cm   LV e' medial:    6.53 cm/s LV PW:         1.10 cm   LV E/e' medial:  16.2 LV IVS:        1.10 cm   LV e' lateral:   7.62 cm/s LVOT diam:     2.10 cm   LV E/e' lateral: 13.9 LV SV:         83 LV SV Index:   37 LVOT Area:     3.46 cm  RIGHT VENTRICLE RV S prime:     14.10 cm/s TAPSE (M-mode): 2.1 cm LEFT ATRIUM             Index        RIGHT ATRIUM           Index LA diam:        4.50 cm 1.99 cm/m   RA Area:     16.10 cm LA Vol (A2C):   39.5 ml 17.51 ml/m  RA Volume:   40.00 ml  17.73 ml/m LA Vol (A4C):  53.8 ml 23.85 ml/m LA Biplane Vol: 49.8 ml 22.08 ml/m  AORTIC VALVE AV Area (Vmax):    2.80 cm AV Area (Vmean):   2.65 cm AV Area (VTI):     2.66 cm AV Vmax:           166.00 cm/s AV Vmean:          117.500 cm/s AV VTI:            0.312 m AV Peak Grad:      11.0 mmHg AV Mean Grad:      6.5 mmHg LVOT Vmax:         134.00 cm/s LVOT Vmean:        90.000 cm/s LVOT VTI:          0.239 m LVOT/AV VTI ratio: 0.77  AORTA Ao Root diam: 3.20 cm Ao Asc diam:  3.30 cm MITRAL VALVE                TRICUSPID VALVE MV Area (PHT): 4.19 cm     TR Peak grad:   57.8 mmHg MV Decel Time: 181 msec     TR Vmax:        380.00 cm/s MV E velocity: 106.00 cm/s MV A velocity: 85.90 cm/s   SHUNTS MV E/A ratio:  1.23         Systemic VTI:  0.24 m                             Systemic Diam: 2.10 cm Dietrich Pates MD Electronically signed by Dietrich Pates MD Signature Date/Time: 10/26/2022/4:40:58 PM    Final    DG Chest Portable 1 View  Result Date: 10/25/2022 CLINICAL DATA:  Weakness. EXAM: PORTABLE CHEST 1 VIEW COMPARISON:  April 28, 2021. FINDINGS: Stable  cardiomediastinal silhouette. Both lungs are clear. The visualized skeletal structures are unremarkable. IMPRESSION: No active disease. Electronically Signed   By: Lupita Raider M.D.   On: 10/25/2022 18:48    Labs: BNP (last 3 results) Recent Labs    10/25/22 2324  BNP 159.7*   Basic Metabolic Panel: Recent Labs  Lab 10/25/22 1458 10/25/22 2049 10/26/22 0628  NA 136 139 141  K 4.2 5.4* 4.3  CL 103 101 103  CO2 24 23 23   GLUCOSE 174* 99 129*  BUN 16 13 12   CREATININE 1.30* 1.15 1.08  CALCIUM 8.6* 9.0 8.8*   Recent Labs  Lab 10/25/22 1458 10/26/22 0628  WBC 6.7 10.2  NEUTROABS 3.6 7.4  HGB 12.1* 12.5*  HCT 36.8* 37.8*  MCV 100.8* 98.2  PLT 216 219   Recent Labs  Lab 10/25/22 1520  GLUCAP 131*  No results for input(s): "VITAMINB12", "FOLATE", "FERRITIN", "TIBC", "IRON", "RETICCTPCT" in the last 72 hours. Urinalysis    Component Value Date/Time   COLORURINE YELLOW 10/25/2022 1535   APPEARANCEUR CLEAR 10/25/2022 1535   LABSPEC 1.006 10/25/2022 1535   PHURINE 5.0 10/25/2022 1535   GLUCOSEU NEGATIVE 10/25/2022 1535   HGBUR NEGATIVE 10/25/2022 1535   BILIRUBINUR NEGATIVE 10/25/2022 1535   KETONESUR NEGATIVE 10/25/2022 1535   PROTEINUR NEGATIVE 10/25/2022 1535   UROBILINOGEN 0.2 06/10/2012 1302   NITRITE NEGATIVE 10/25/2022 1535   LEUKOCYTESUR NEGATIVE 10/25/2022 1535   Sepsis Labs Recent Labs  Lab 10/25/22 1458 10/26/22 0628  WBC 6.7 10.2   Microbiology Recent Results (from the past 240 hour(s))  Blood culture (routine x 2)     Status: None (Preliminary  result)   Collection Time: 10/25/22  3:54 PM   Specimen: BLOOD LEFT HAND  Result Value Ref Range Status   Specimen Description BLOOD LEFT HAND  Final   Special Requests   Final    BOTTLES DRAWN AEROBIC AND ANAEROBIC Blood Culture adequate volume   Culture   Final    NO GROWTH < 24 HOURS Performed at [facility], [address], [place], [place] [address]    Report Status PENDING  Incomplete   Blood culture (routine x 2)     Status: None (Preliminary result)   Collection Time: 10/25/22  4:16 PM   Specimen: BLOOD LEFT HAND  Result Value Ref Range Status   Specimen Description BLOOD LEFT HAND  Final   Special Requests   Final    BOTTLES DRAWN AEROBIC AND ANAEROBIC Blood Culture results may not be optimal due to an inadequate volume of blood received in culture bottles   Culture   Final    NO GROWTH < 24 HOURS Performed at [facility], [address] [place], [place] [address]    Report Status PENDING  Incomplete   Time coordinating discharge: 25 minutes SIGNED: Lanae Boast, MD  [facility] 10/26/2022, 5:05 PM  If 7PM-7AM, please contact night-coverage [email or web address]

## 2022-10-26 NOTE — Progress Notes (Signed)
Patient is constantly unhooking IV and getting out of bed. He is adamant about walking around. However, I do not feel comfortable with allowing the patient to walk as he is wheezing and a severe fall risk. [facility] on call Dr. Antionette Char was notified and safety/sitter orders are in. At this time, there is no one staffing can send [place]. One time order of haldol was ordered but Dr. Antionette Char instructed me to not give unless I really need to and to utilize sitter first. At this time, haldol has not been given. I will continue to try to keep the patient calm and utilize verbal de-escalation before medicating.

## 2022-10-26 NOTE — Progress Notes (Signed)
PT note Evaluation completed. Full note to follow. Orthostatic BPs  Supine Pt on EOB on arrival  Sitting 164/81, 96 bpm  Standing 156/78, 102 bpm  Standing after 3 min 173/68, 101 bpm  No dizziness reported.  O2 sats on RA 95-98% with activity. Pt DOE 3/4 and encouraged pursed lip breathing.  Kristion Holifield M,PT Acute Rehab Services [phone]

## 2022-10-26 NOTE — Hospital Course (Addendum)
88-yom w/ HLD, HTN, A-fib, remote history of DVT/saddle PE who felt too weak at kitchen 10/24/22, and his BP was in 70s over 40s, so went to UC0 bp had normalized then was sent home with instructions that if that recurrent he should go to the ER. Again 6/27 he had a repeat episode.  After lunch he checked his blood pressure meds.  He started seeing dots and cloudy stuff.  Became mildly weak.  Sat in the chair BP blood pressure check revealed repeat blood pressures again in the 70s over 40s.  He denies being lightheaded or dizzy.  Denies fever, chills, nausea, vomiting, diarrhea, cough, shortness of breath or burning urination.  In the ER presenting blood pressure 78/44, pulse 45.  Lactic acid 2.1, creatinine 1.3 given 2 L IV fluid bolus BP stabilized lactic acid remained high so admission requested.  Chest x-ray no active disease. UA was unremarkable blood culture was sent empirically. BNP 159. With further hydration lactic acid has normalized to 1.7 CBC remained stable without leukocytosis renal function is stable improved from 1.3-1.0, no recurrence of hypotension Overnight patient has been wanting to get up and walk around, Echocardiogram ordered for lower extremity edema which is chronic.  Obtained PT OT evaluation and he did well, no orthostatic hypotension noted , no dizziness.  Denies cough or respiratory symptoms focal weakness dizziness headache.  He feels ready for discharge.  PT OT advised home health.

## 2022-10-30 LAB — CULTURE, BLOOD (ROUTINE X 2)
Culture: NO GROWTH
Culture: NO GROWTH
Special Requests: ADEQUATE

## 2022-11-26 ENCOUNTER — Ambulatory Visit (INDEPENDENT_AMBULATORY_CARE_PROVIDER_SITE_OTHER): Payer: No Typology Code available for payment source | Admitting: Family Medicine

## 2022-11-26 ENCOUNTER — Encounter: Payer: Self-pay | Admitting: Family Medicine

## 2022-11-26 VITALS — BP 138/78 | HR 95 | Temp 97.4°F | Ht 70.0 in | Wt 238.8 lb

## 2022-11-26 DIAGNOSIS — E785 Hyperlipidemia, unspecified: Secondary | ICD-10-CM

## 2022-11-26 DIAGNOSIS — I10 Essential (primary) hypertension: Secondary | ICD-10-CM | POA: Diagnosis not present

## 2022-11-26 DIAGNOSIS — R739 Hyperglycemia, unspecified: Secondary | ICD-10-CM | POA: Diagnosis not present

## 2022-11-26 MED ORDER — GABAPENTIN 300 MG PO CAPS: 300.0000 mg | ORAL_CAPSULE | Freq: Three times a day (TID) | ORAL | 1 refills | Status: DC

## 2022-11-26 NOTE — Progress Notes (Signed)
Subjective:  Patient ID: Jerry Fox, male    DOB: [birth year]  Age: 89 y.o. MRN: [id number]  CC: Medical Management of Chronic Issues   HPI Jerry Fox presents for  presents for  follow-up of hypertension. Patient has no history of headache chest pain or shortness of breath or recent cough. Patient also denies symptoms of TIA such as focal numbness or weakness. Patient denies side effects from medication. States taking it regularly.   in for follow-up of elevated cholesterol. Doing well without complaints on current medication. Denies side effects of statin including myalgia and arthralgia and nausea. Currently no chest pain, shortness of breath or other cardiovascular related symptoms noted.      11/26/2022   11:01 AM 08/07/2022    4:20 PM 08/07/2022    4:03 PM  Depression screen PHQ 2/9  Decreased Interest 0 3 0  Down, Depressed, Hopeless 0 0 0  PHQ - 2 Score 0 3 0  Altered sleeping  0   Tired, decreased energy  0   Change in appetite  0   Feeling bad or failure about yourself   0   Trouble concentrating  0   Moving slowly or fidgety/restless  1   Suicidal thoughts  0   PHQ-9 Score  4   Difficult doing work/chores  Not difficult at all     History Jerry Fox has a past medical history of AAA (abdominal aortic aneurysm) (HCC) (2/14), Arthritis, Atrial fibrillation (HCC) (2/14), Colon polyps, Hyperlipidemia, Hypertension, Murmur, cardiac, and 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   He has a past surgical history that includes Tonsillectomy.   His family history includes Arthritis in his father and mother; Congestive Heart Failure in his brother, sister, and sister; Heart disease in his father, mother, and paternal grandfather; Hypertension in his father; Stroke in his brother.He reports that he quit smoking about 68 years ago. His smoking use included cigarettes. He started smoking about 78 years ago. He has a 10 pack-year smoking history. He quit smokeless tobacco use  about 58 years ago.  His smokeless tobacco use included chew. He reports that he does not drink alcohol and does not use drugs.    ROS Review of Systems  Constitutional:  Negative for fever.  Respiratory:  Negative for shortness of breath.   Cardiovascular:  Negative for chest pain.  Musculoskeletal:  Negative for arthralgias.  Skin:  Negative for rash.    Objective:  BP 138/78   Pulse 95   Temp (!) 97.4 F (36.3 C)   Ht 5\' 10"  (1.778 m)   Wt 238 lb 12.8 oz (108.3 kg)   SpO2 96%   BMI 34.26 kg/m   BP Readings from Last 3 Encounters:  11/26/22 138/78  10/26/22 (!) 146/66  08/07/22 (!) 162/71    Wt Readings from Last 3 Encounters:  11/26/22 238 lb 12.8 oz (108.3 kg)  10/25/22 240 lb 1.3 oz (108.9 kg)  08/07/22 240 lb (108.9 kg)     Physical Exam Vitals reviewed.  Constitutional:      Appearance: He is well-developed.  HENT:     Head: Normocephalic and atraumatic.     Right Ear: External ear normal.     Left Ear: External ear normal.     Mouth/Throat:     Pharynx: No oropharyngeal exudate or posterior oropharyngeal erythema.  Eyes:     Pupils: Pupils are equal, round, and reactive to light.  Cardiovascular:     Rate and Rhythm: Normal rate and  regular rhythm.     Heart sounds: No murmur heard. Pulmonary:     Effort: No respiratory distress.     Breath sounds: Normal breath sounds.  Musculoskeletal:     Cervical back: Normal range of motion and neck supple.  Neurological:     Mental Status: He is alert and oriented to person, place, and time.       Assessment & Plan:   Jerry Fox was seen today for medical management of chronic issues.  Diagnoses and all orders for this visit:  Accelerated hypertension -     CBC with Differential/Platelet -     CMP14+EGFR  Hyperlipidemia, unspecified hyperlipidemia type -     Lipid panel  Primary hypertension  Other orders -     Discontinue: gabapentin (NEURONTIN) 300 MG capsule; Take 1 capsule (300 mg total) by  mouth 3 (three) times daily. -     gabapentin (NEURONTIN) 300 MG capsule; Take 1 capsule (300 mg total) by mouth 3 (three) times daily.       I am having Jerry Fox maintain his dorzolamide-timolol, latanoprost, furosemide, traZODone, lovastatin, potassium chloride, apixaban, acetaminophen, triamcinolone cream, metoprolol tartrate, albuterol, loratadine, Cholecalciferol, and gabapentin.  Allergies as of 11/26/2022       Reactions   Diltiazem Rash        Medication List        Accurate as of November 26, 2022  5:06 PM. If you have any questions, ask your nurse or doctor.          acetaminophen 500 MG tablet Commonly known as: TYLENOL Take 1,000 mg by mouth 3 (three) times daily.   apixaban 5 MG Tabs tablet Commonly known as: ELIQUIS Take 1 tablet (5 mg total) by mouth 2 (two) times daily.   Cholecalciferol 50 MCG (2000 UT) Caps Take 1 capsule by mouth daily.   dorzolamide-timolol 2-0.5 % ophthalmic solution Commonly known as: COSOPT Place 1 drop into both eyes 2 (two) times daily.   furosemide 40 MG tablet Commonly known as: LASIX Take 1 tablet (40 mg total) by mouth daily.   gabapentin 300 MG capsule Commonly known as: Neurontin Take 1 capsule (300 mg total) by mouth 3 (three) times daily. What changed:  medication strength how much to take when to take this Changed by: Vinita Prentiss   latanoprost 0.005 % ophthalmic solution Commonly known as: XALATAN Place 1 drop into both eyes at bedtime.   loratadine 10 MG tablet Commonly known as: CLARITIN Take 1 tablet (10 mg total) by mouth daily for 7 days, then as needed thereafter.   lovastatin 40 MG tablet Commonly known as: MEVACOR Take 1 tablet (40 mg total) by mouth at bedtime.   metoprolol tartrate 25 MG tablet Commonly known as: LOPRESSOR Take 0.5 tablets (12.5 mg total) by mouth 2 (two) times daily.   potassium chloride 10 MEQ tablet Commonly known as: KLOR-CON Take 1 tablet (10 mEq total)  by mouth daily.   traZODone 50 MG tablet Commonly known as: DESYREL Take 1 tablet (50 mg total) by mouth at bedtime.   triamcinolone cream 0.1 % Commonly known as: KENALOG Apply 1 Application topically 2 (two) times daily. to affected area(s)   Ventolin HFA 108 (90 Base) MCG/ACT inhaler Generic drug: albuterol Inhale 2 puffs into the lungs every 4 (four) hours as needed for wheezing or shortness of breath.         Follow-up: Return in about 6 months (around 05/29/2023).  Mechele Claude, M.D.

## 2022-11-28 LAB — SPECIMEN STATUS REPORT

## 2022-11-28 LAB — HGB A1C W/O EAG: Hgb A1c MFr Bld: 5.8 % — ABNORMAL HIGH (ref 4.8–5.6)

## 2022-12-04 ENCOUNTER — Other Ambulatory Visit: Payer: Self-pay | Admitting: *Deleted

## 2022-12-04 ENCOUNTER — Telehealth: Payer: Self-pay | Admitting: Family Medicine

## 2022-12-04 MED ORDER — METOPROLOL TARTRATE 25 MG PO TABS: 12.5000 mg | ORAL_TABLET | Freq: Two times a day (BID) | ORAL | 3 refills | Status: DC

## 2022-12-04 NOTE — Telephone Encounter (Signed)
Refills sent as requested

## 2022-12-04 NOTE — Telephone Encounter (Signed)
  Prescription Request  12/04/2022  Is this a "Controlled Substance" medicine? no  Have you seen your PCP in the last 2 weeks? Yes last week  If YES, route message to pool  -  If NO, patient needs to be scheduled for appointment.  What is the name of the medication or equipment?  metoprolol tartrate (LOPRESSOR) 25 MG tablet and  loratadine (CLARITIN) 10 MG tablet   Have you contacted your pharmacy to request a refill? yes  Which pharmacy would you like this sent to? [facility]    Patient notified that their request is being sent to the clinical staff for review and that they should receive a response within 2 business days.

## 2022-12-18 IMAGING — DX DG LUMBAR SPINE 2-3V
2 series · 2 of 2 positions shown · non-contrast
Comparison: 11/02/2015

CLINICAL DATA: Lumbar pain

EXAM:
LUMBAR SPINE - 2-3 VIEW

[l-spine ap]
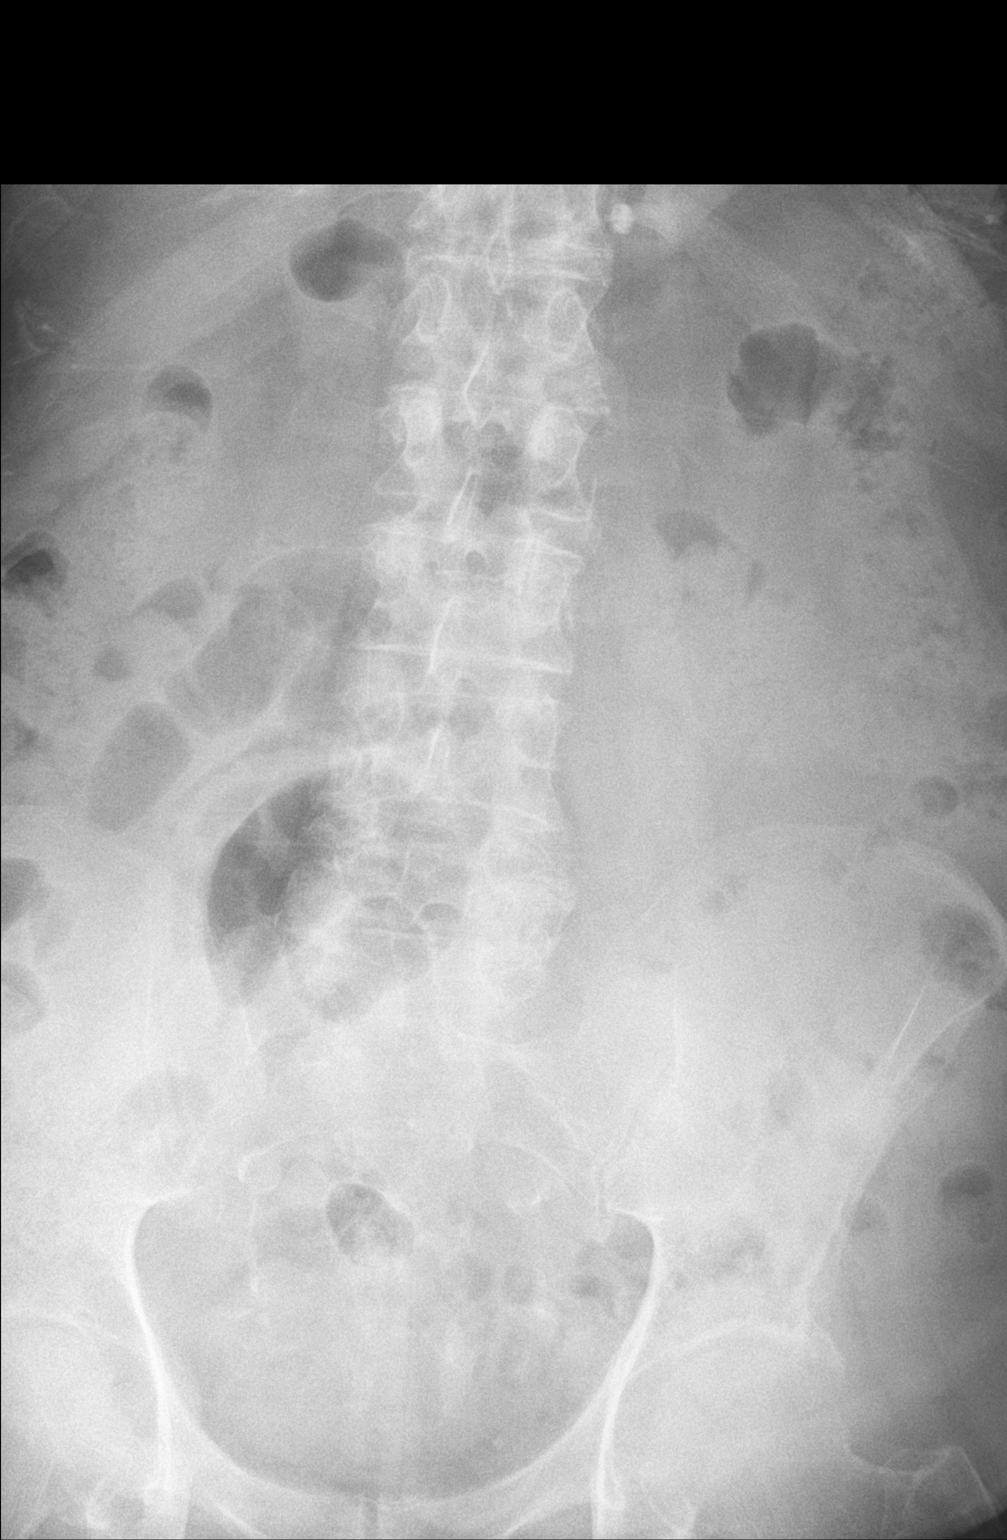

[l-spine lat]
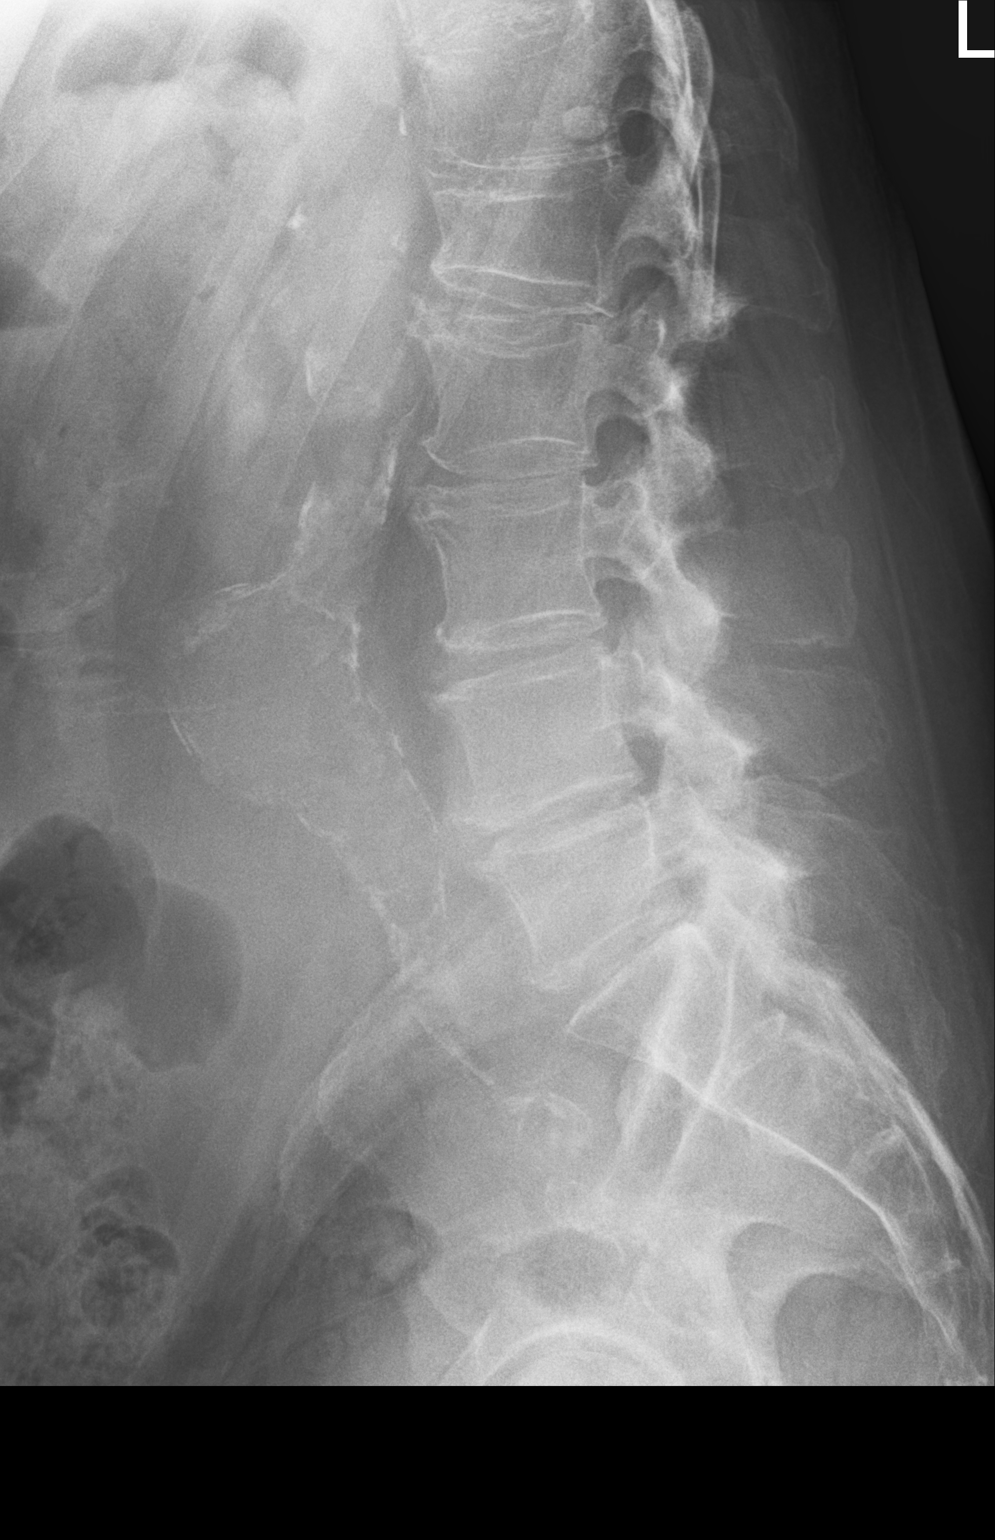

[2 of 2 positions shown; findings below may reference images not displayed]

FINDINGS: Frontal and lateral views of the lumbar spine are obtained. Since
the prior exam, there is an anterior wedge compression deformity of
the L1 vertebral body, which appears chronic. No other acute bony
abnormalities. There is extensive multilevel spondylosis and facet
hypertrophy which has progressed since prior study, greatest from
L3-4 through L5-S1. Sacroiliac joints are unremarkable. Diffuse
atherosclerosis, with infrarenal abdominal aortic aneurysm measuring
up to 5.4 cm.
IMPRESSION: 1. Anterior wedge compression deformity at L1, with less than 25%
loss of height. This is new since prior study, but likely chronic.
2. Otherwise no acute bony abnormalities.
3. Progressive multilevel spondylosis and facet hypertrophy.
4. 5.4 cm infrarenal abdominal aortic aneurysm, which has increased
in size since prior study. Further evaluation with ultrasound may be
useful.

## 2023-02-01 ENCOUNTER — Telehealth: Payer: Self-pay | Admitting: Family Medicine

## 2023-02-01 NOTE — Telephone Encounter (Signed)
  Prescription Request  02/01/2023  Is this a "Controlled Substance" medicine? metoprolol tartrate (LOPRESSOR) 25 MG tablet   Have you seen your PCP in the last 2 weeks? no  If YES, route message to pool  -  If NO, patient needs to be scheduled for appointment.  What is the name of the medication or equipment? metoprolol tartrate (LOPRESSOR) 25 MG tablet   Have you contacted your pharmacy to request a refill? no   Which pharmacy would you like this sent to? [facility]   Patient notified that their request is being sent to the clinical staff for review and that they should receive a response within 2 business days.

## 2023-02-04 ENCOUNTER — Other Ambulatory Visit: Payer: Self-pay | Admitting: Family Medicine

## 2023-02-04 MED ORDER — METOPROLOL TARTRATE 25 MG PO TABS: 12.5000 mg | ORAL_TABLET | Freq: Two times a day (BID) | ORAL | 3 refills | Status: DC

## 2023-02-04 NOTE — Telephone Encounter (Signed)
Please let the patient know that I sent their prescription to their pharmacy. Thanks, WS 

## 2023-02-06 ENCOUNTER — Telehealth: Payer: Self-pay | Admitting: Family Medicine

## 2023-02-06 NOTE — Telephone Encounter (Signed)
  Prescription Request  02/06/2023  Is this a "Controlled Substance" medicine? NO  Have you seen your PCP in the last 2 weeks? NO  If YES, route message to pool  -  If NO, patient needs to be scheduled for appointment.  What is the name of the medication or equipment? Metoprolol 25 mg - Patient is out.  Have you contacted your pharmacy to request a refill? NO   Which pharmacy would you like this sent to? [facility]   Patient notified that their request is being sent to the clinical staff for review and that they should receive a response within 2 business days.

## 2023-02-06 NOTE — Telephone Encounter (Signed)
Spoke with patient's wife and advised her Metoprolol was refilled on 02/04/23 to [facility].

## 2023-02-07 ENCOUNTER — Encounter (HOSPITAL_BASED_OUTPATIENT_CLINIC_OR_DEPARTMENT_OTHER): Payer: Self-pay | Admitting: Emergency Medicine

## 2023-02-07 ENCOUNTER — Other Ambulatory Visit: Payer: Self-pay

## 2023-02-07 ENCOUNTER — Emergency Department (HOSPITAL_BASED_OUTPATIENT_CLINIC_OR_DEPARTMENT_OTHER)
Admission: EM | Admit: 2023-02-07 | Discharge: 2023-02-07 | Disposition: A | Discharge status: Home / Self Care | Payer: No Typology Code available for payment source | Attending: Emergency Medicine | Admitting: Emergency Medicine

## 2023-02-07 ENCOUNTER — Emergency Department (HOSPITAL_BASED_OUTPATIENT_CLINIC_OR_DEPARTMENT_OTHER): Payer: No Typology Code available for payment source

## 2023-02-07 DIAGNOSIS — I4891 Unspecified atrial fibrillation: Secondary | ICD-10-CM | POA: Diagnosis not present

## 2023-02-07 DIAGNOSIS — Z79899 Other long term (current) drug therapy: Secondary | ICD-10-CM | POA: Insufficient documentation

## 2023-02-07 DIAGNOSIS — Z7901 Long term (current) use of anticoagulants: Secondary | ICD-10-CM | POA: Insufficient documentation

## 2023-02-07 DIAGNOSIS — I959 Hypotension, unspecified: Secondary | ICD-10-CM | POA: Diagnosis present

## 2023-02-07 DIAGNOSIS — I10 Essential (primary) hypertension: Secondary | ICD-10-CM | POA: Insufficient documentation

## 2023-02-07 DIAGNOSIS — R0602 Shortness of breath: Secondary | ICD-10-CM | POA: Diagnosis not present

## 2023-02-07 DIAGNOSIS — R001 Bradycardia, unspecified: Secondary | ICD-10-CM | POA: Diagnosis not present

## 2023-02-07 DIAGNOSIS — I700 Atherosclerosis of aorta: Secondary | ICD-10-CM | POA: Diagnosis not present

## 2023-02-07 DIAGNOSIS — R0989 Other specified symptoms and signs involving the circulatory and respiratory systems: Secondary | ICD-10-CM | POA: Diagnosis not present

## 2023-02-07 LAB — MAGNESIUM: Magnesium: 2.1 mg/dL (ref 1.7–2.4)

## 2023-02-07 LAB — TROPONIN I (HIGH SENSITIVITY): Troponin I (High Sensitivity): 8 ng/L (ref <18)

## 2023-02-07 LAB — COMPREHENSIVE METABOLIC PANEL
ALT: 11 U/L (ref 0–44)
AST: 11 U/L — ABNORMAL LOW (ref 15–41)
Albumin: 3.5 g/dL (ref 3.5–5.0)
Alkaline Phosphatase: 42 U/L (ref 38–126)
Anion gap: 7 (ref 5–15)
BUN: 14 mg/dL (ref 8–23)
CO2: 28 mmol/L (ref 22–32)
Calcium: 8.5 mg/dL — ABNORMAL LOW (ref 8.9–10.3)
Chloride: 101 mmol/L (ref 98–111)
Creatinine, Ser: 0.96 mg/dL (ref 0.61–1.24)
GFR, Estimated: >60 mL/min (ref >60)
Glucose, Bld: 206 mg/dL — ABNORMAL HIGH (ref 70–99)
Potassium: 4.2 mmol/L (ref 3.5–5.1)
Sodium: 136 mmol/L (ref 135–145)
Total Bilirubin: 0.7 mg/dL (ref 0.3–1.2)
Total Protein: 5.8 g/dL — ABNORMAL LOW (ref 6.5–8.1)

## 2023-02-07 LAB — CBC WITH DIFFERENTIAL/PLATELET
Abs Immature Granulocytes: 0.01 10*3/uL (ref 0.00–0.07)
Basophils Absolute: 0 10*3/uL (ref 0.0–0.1)
Basophils Relative: 1 %
Eosinophils Absolute: 0.1 10*3/uL (ref 0.0–0.5)
Eosinophils Relative: 1 %
HCT: 37.5 % — ABNORMAL LOW (ref 39.0–52.0)
Hemoglobin: 12.3 g/dL — ABNORMAL LOW (ref 13.0–17.0)
Immature Granulocytes: 0 %
Lymphocytes Relative: 30 %
Lymphs Abs: 1.8 10*3/uL (ref 0.7–4.0)
MCH: 31.8 pg (ref 26.0–34.0)
MCHC: 32.8 g/dL (ref 30.0–36.0)
MCV: 96.9 fL (ref 80.0–100.0)
Monocytes Absolute: 0.5 10*3/uL (ref 0.1–1.0)
Monocytes Relative: 8 %
Neutro Abs: 3.7 10*3/uL (ref 1.7–7.7)
Neutrophils Relative %: 60 %
Platelets: 223 10*3/uL (ref 150–400)
RBC: 3.87 MIL/uL — ABNORMAL LOW (ref 4.22–5.81)
RDW: 13.4 % (ref 11.5–15.5)
WBC: 6.1 10*3/uL (ref 4.0–10.5)
nRBC: 0 % (ref 0.0–0.2)

## 2023-02-07 NOTE — ED Provider Notes (Signed)
[place] EMERGENCY DEPARTMENT AT [facility] Provider Note   CSN: [id number] Arrival date & time: 02/07/23  1227     History  Chief Complaint  Patient presents with   Hypotension    Jerry Fox is a 89 y.o. male.  HPI   This patient is an 89 year old male, he has a history of multiple medical problems, he follows with Dr. Selina Cooley at [facility], he has a history of a AAA, arthritis, A-fib, hypertension and has had a prior saddle pulmonary embolism, these date back approximately 10 years.  He is currently anticoagulated on Eliquis.  The patient was last admitted to the hospital approximately 3-1/2 months ago when he was found to be hypotensive when he was at home, blood pressure was 70/40, he had some repeat episodes and was eventually admitted to the hospital.  He was stabilized and did very well, was discharged home and is stayed out of the hospital for the last few months.  This morning the patient had his blood pressure checked by his spouse as they do per usual, he was not having any symptoms but it was found to be very low like 70/40 so he was sent to the hospital.  He is accompanied by his son.  The patient denies chest pain shortness of breath coughing fevers but does have some lower extremity swelling which she states is chronic.  Again the medications were reviewed and he is on Eliquis, Lasix 40 mg/day, gabapentin, statin, metoprolol and potassium supplements.  Home Medications Prior to Admission medications   Medication Sig Start Date End Date Taking? Authorizing Provider  apixaban (ELIQUIS) 5 MG TABS tablet Take 1 tablet (5 mg total) by mouth 2 (two) times daily. 05/28/22  Yes Mechele Claude, MD  Cholecalciferol 50 MCG (2000 UT) CAPS Take 1 capsule by mouth daily.   Yes [provider]  gabapentin (NEURONTIN) 300 MG capsule Take 1 capsule (300 mg total) by mouth 3 (three) times daily. 11/26/22  Yes Mechele Claude, MD  metoprolol  tartrate (LOPRESSOR) 25 MG tablet Take 0.5 tablets (12.5 mg total) by mouth 2 (two) times daily. 02/04/23 03/06/23 Yes Stacks, Broadus John, MD  tiZANidine (ZANAFLEX) 2 MG tablet Take by mouth every 6 (six) hours as needed for muscle spasms.   Yes [provider]  acetaminophen (TYLENOL) 500 MG tablet Take 1,000 mg by mouth 3 (three) times daily.    [provider]  albuterol (VENTOLIN HFA) 108 (90 Base) MCG/ACT inhaler Inhale 2 puffs into the lungs every 4 (four) hours as needed for wheezing or shortness of breath. 10/26/22 11/25/22  Lanae Boast, MD  dorzolamide-timolol (COSOPT) 22.3-6.8 MG/ML ophthalmic solution Place 1 drop into both eyes 2 (two) times daily. 10/26/15   [provider]  furosemide (LASIX) 40 MG tablet Take 1 tablet (40 mg total) by mouth daily. 05/28/22   Mechele Claude, MD  latanoprost (XALATAN) 0.005 % ophthalmic solution Place 1 drop into both eyes at bedtime. 10/26/15   [provider]  loratadine (CLARITIN) 10 MG tablet Take 1 tablet (10 mg total) by mouth daily for 7 days, then as needed thereafter. 10/27/22 02/03/23  Lanae Boast, MD  lovastatin (MEVACOR) 40 MG tablet Take 1 tablet (40 mg total) by mouth at bedtime. 05/28/22   Mechele Claude, MD  potassium chloride (KLOR-CON) 10 MEQ tablet Take 1 tablet (10 mEq total) by mouth daily. 05/28/22   Mechele Claude, MD  traZODone (DESYREL) 50 MG tablet Take 1 tablet (50 mg total) by  mouth at bedtime. 05/28/22   Mechele Claude, MD  triamcinolone cream (KENALOG) 0.1 % Apply 1 Application topically 2 (two) times daily. to affected area(s) 10/11/22   [provider]      Allergies    Diltiazem    Review of Systems   Review of Systems  All other systems reviewed and are negative.   Physical Exam Updated Vital Signs BP 127/69   Pulse 66   Temp 97.7 F (36.5 C) (Oral)   Resp 18   SpO2 99%  Physical Exam Vitals and nursing note reviewed.  Constitutional:      General: He is not in acute distress.     Appearance: He is well-developed.  HENT:     Head: Normocephalic and atraumatic.     Mouth/Throat:     Pharynx: No oropharyngeal exudate.  Eyes:     General: No scleral icterus.       Right eye: No discharge.        Left eye: No discharge.     Conjunctiva/sclera: Conjunctivae normal.     Pupils: Pupils are equal, round, and reactive to light.  Neck:     Thyroid: No thyromegaly.     Vascular: No JVD.  Cardiovascular:     Rate and Rhythm: Normal rate. Rhythm irregular.     Heart sounds: Normal heart sounds. No murmur heard.    No friction rub. No gallop.  Pulmonary:     Effort: Pulmonary effort is normal. No respiratory distress.     Breath sounds: Normal breath sounds. No wheezing or rales.  Abdominal:     General: Bowel sounds are normal. There is no distension.     Palpations: Abdomen is soft. There is no mass.     Tenderness: There is no abdominal tenderness.  Musculoskeletal:        General: No tenderness. Normal range of motion.     Cervical back: Normal range of motion and neck supple.     Right lower leg: Edema present.     Left lower leg: Edema present.  Lymphadenopathy:     Cervical: No cervical adenopathy.  Skin:    General: Skin is warm and dry.     Findings: No erythema or rash.  Neurological:     General: No focal deficit present.     Mental Status: He is alert.     Coordination: Coordination normal.  Psychiatric:        Behavior: Behavior normal.     ED Results / Procedures / Treatments   Labs (all labs ordered are listed, but only abnormal results are displayed) Labs Reviewed  CBC WITH DIFFERENTIAL/PLATELET  COMPREHENSIVE METABOLIC PANEL  TROPONIN I (HIGH SENSITIVITY)    EKG EKG Interpretation Date/Time:  Thursday February 07 2023 13:03:58 EDT Ventricular Rate:  67 PR Interval:    QRS Duration:  122 QT Interval:  422 QTC Calculation: 446 R Axis:   45  Text Interpretation: Atrial fibrillation Nonspecific intraventricular conduction delay  Confirmed by Eber Hong ([id number]) on 02/07/2023 1:05:55 PM  Radiology No results found.  Procedures Procedures    Medications Ordered in ED Medications - No data to display  ED Course/ Medical Decision Making/ A&P                                 Medical Decision Making Amount and/or Complexity of Data Reviewed Labs: ordered. Radiology: ordered. ECG/medicine tests: ordered.    This  patient presents to the ED for concern of hypotension which has resolved spontaneously by arrival, check on arrival is 127/69 with a pulse of 66, A-fib present, this involves an extensive number of treatment options, and is a complaint that carries with it a high risk of complications and morbidity.  The differential diagnosis includes patient could have some transient hypotension, this could have been vagal, positional, orthostatic, he could be over diuresed or have hyperkalemia   Co morbidities that complicate the patient evaluation  History of A-fib, blood clots, anticoagulated   Additional history obtained:  Additional history obtained from medical record External records from outside source obtained and reviewed including needing prescriptions for metoprolol, there are some notes back-and-forth from the doctor's office where he has been asking for refills, he has not been seen by his family doctor in the last few months.   Lab Tests:  I Ordered, and personally interpreted labs.  The pertinent results include: CBC which was unremarkable, metabolic panel showing mild hyperglycemia, no renal dysfunction or electrolyte abnormalities, troponin was negative, magnesium was normal   Imaging Studies ordered:  I ordered imaging studies including chest x-ray I independently visualized and interpreted imaging which showed no acute findings I agree with the radiologist interpretation   Cardiac Monitoring: / EKG:  The patient was maintained on a cardiac monitor.  I personally viewed and  interpreted the cardiac monitored which showed an underlying rhythm of: Atrial fibrillation    Problem List / ED Course / Critical interventions / Medication management  Overall this patient is in no distress, not tachycardic and not hypotensive here.  There did seem to be some issues with his blood pressure being slightly low and unfortunately his beta-blocker may need to be cut in half. The patient was observed in the emergency department without any further hypotension or concerning factors I have reviewed the patients home medicines and have made adjustments as needed   Social Determinants of Health:  None   Test / Admission - Considered:  Considered admission but the patient revealed no signs of hypotension shock or high risk features.  He is agreeable to follow-up outpatient         Final Clinical Impression(s) / ED Diagnoses Final diagnoses:  Transient hypotension  Atrial fibrillation, unspecified type [facility])    Rx / DC Orders ED Discharge Orders     None         Eber Hong, MD 02/16/23 1156

## 2023-02-07 NOTE — Discharge Instructions (Signed)
You need to have a repeat evaluation within 2 days at your family doctor's office.  If anything gets worse including low blood pressure lightheadedness chest pain shortness of breath or weakness you need to return to the ER immediately for recheck.  I want you to start taking the metoprolol once a day instead of twice a day.  I have messaged your family doctor and asked him to facilitate getting you back into the office for a recheck quickly.

## 2023-02-07 NOTE — ED Triage Notes (Signed)
Reports low BP at home. Feels "ok" but more tired Some dizziness and blurred vision this AM, no now

## 2023-02-11 ENCOUNTER — Encounter: Payer: Self-pay | Admitting: Family Medicine

## 2023-02-11 ENCOUNTER — Ambulatory Visit (INDEPENDENT_AMBULATORY_CARE_PROVIDER_SITE_OTHER): Payer: No Typology Code available for payment source | Admitting: Family Medicine

## 2023-02-11 VITALS — BP 133/89 | HR 92 | Temp 97.6°F | Ht 70.0 in | Wt 237.6 lb

## 2023-02-11 DIAGNOSIS — Z23 Encounter for immunization: Secondary | ICD-10-CM | POA: Diagnosis not present

## 2023-02-11 DIAGNOSIS — I4891 Unspecified atrial fibrillation: Secondary | ICD-10-CM

## 2023-02-11 DIAGNOSIS — I959 Hypotension, unspecified: Secondary | ICD-10-CM

## 2023-02-11 MED ORDER — FUROSEMIDE 20 MG PO TABS: 20.0000 mg | ORAL_TABLET | Freq: Every day | ORAL | 2 refills | Status: DC

## 2023-02-11 MED ORDER — TRAZODONE HCL 50 MG PO TABS: 50.0000 mg | ORAL_TABLET | Freq: Every day | ORAL | 3 refills | Status: DC

## 2023-02-11 MED ORDER — DIGOXIN 125 MCG PO TABS: 125.0000 ug | ORAL_TABLET | Freq: Every day | ORAL | 3 refills | Status: DC

## 2023-02-11 NOTE — Progress Notes (Signed)
Subjective:  Patient ID: Jerry Fox, male    DOB: 1933-10-07  Age: 89 y.o. MRN: [id number]  CC: ER follow up   HPI Jerry Fox presents for drop in BP noted several times at home. Vague symptoms of malaise associated. There was no chest pain syncope or dyspnea. Multiple readings by wife show most to be 110-120 systolic with a few dropping 90-100 over the last week. Seen in E.D. on 10/10 after BP dropped to 70/40. On arrival at E.D. the pressure was 127/69.   Atrial fibrillation:. Pt. is treated with rate control and anticoagulation. Pt.  denies palpitations, rapid rate, chest pain, dyspnea and edema. There has been no bleeding from nose or gums. Pt. has not noticed blood with urine or stool.  Although there is routine bruising easily, it is not excessive.      02/11/2023   10:10 AM 11/26/2022   11:01 AM 08/07/2022    4:20 PM  Depression screen PHQ 2/9  Decreased Interest 0 0 3  Down, Depressed, Hopeless 0 0 0  PHQ - 2 Score 0 0 3  Altered sleeping   0  Tired, decreased energy   0  Change in appetite   0  Feeling bad or failure about yourself    0  Trouble concentrating   0  Moving slowly or fidgety/restless   1  Suicidal thoughts   0  PHQ-9 Score   4  Difficult doing work/chores   Not difficult at all    History Jerry Fox has a past medical history of AAA (abdominal aortic aneurysm) (HCC) (2/14), Arthritis, Atrial fibrillation (HCC) (2/14), Colon polyps, Hyperlipidemia, Hypertension, Murmur, cardiac, and 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   He has a past surgical history that includes Tonsillectomy.   His family history includes Arthritis in his father and mother; Congestive Heart Failure in his brother, sister, and sister; Heart disease in his father, mother, and paternal grandfather; Hypertension in his father; Stroke in his brother.He reports that he quit smoking about 69 years ago. His smoking use included cigarettes. He started smoking about 79 years ago. He  has a 10 pack-year smoking history. He quit smokeless tobacco use about 59 years ago.  His smokeless tobacco use included chew. He reports that he does not drink alcohol and does not use drugs.    ROS Review of Systems  Constitutional:  Negative for fever.  Respiratory:  Negative for shortness of breath.   Cardiovascular:  Negative for chest pain.  Musculoskeletal:  Negative for arthralgias.  Skin:  Negative for rash.    Objective:  BP 133/89   Pulse 92   Temp 97.6 F (36.4 C)   Ht 5\' 10"  (1.778 m)   Wt 237 lb 9.6 oz (107.8 kg)   BMI 34.09 kg/m   BP Readings from Last 3 Encounters:  02/11/23 133/89  02/07/23 127/69  11/26/22 138/78    Wt Readings from Last 3 Encounters:  02/11/23 237 lb 9.6 oz (107.8 kg)  11/26/22 238 lb 12.8 oz (108.3 kg)  10/25/22 240 lb 1.3 oz (108.9 kg)     Physical Exam Vitals reviewed.  Constitutional:      Appearance: He is well-developed.  HENT:     Head: Normocephalic and atraumatic.     Right Ear: External ear normal.     Left Ear: External ear normal.     Mouth/Throat:     Pharynx: No oropharyngeal exudate or posterior oropharyngeal erythema.  Eyes:     Pupils: Pupils  are equal, round, and reactive to light.  Cardiovascular:     Rate and Rhythm: Normal rate and regular rhythm.     Heart sounds: No murmur heard. Pulmonary:     Effort: No respiratory distress.     Breath sounds: Normal breath sounds.  Musculoskeletal:     Cervical back: Normal range of motion and neck supple.  Neurological:     Mental Status: He is alert and oriented to person, place, and time.       Assessment & Plan:     Due to low low BP, he is not a candidate for B Blocker or Ca blocker at this time, but has A. Fib at a moderate rate. Digoxin should control the rate without adverse affect on BP.    I have discontinued Dow Adolph. Jay's metoprolol tartrate. I have also changed his furosemide. Additionally, I am having him maintain his  dorzolamide-timolol, latanoprost, lovastatin, potassium chloride, apixaban, acetaminophen, triamcinolone cream, albuterol, loratadine, Cholecalciferol, gabapentin, tiZANidine, traZODone, and digoxin.  Allergies as of 02/11/2023       Reactions   Diltiazem Rash        Medication List        Accurate as of February 11, 2023  6:20 PM. If you have any questions, ask your nurse or doctor.          STOP taking these medications    metoprolol tartrate 25 MG tablet Commonly known as: LOPRESSOR Stopped by: Isabella Roemmich       TAKE these medications    acetaminophen 500 MG tablet Commonly known as: TYLENOL Take 1,000 mg by mouth 3 (three) times daily.   apixaban 5 MG Tabs tablet Commonly known as: ELIQUIS Take 1 tablet (5 mg total) by mouth 2 (two) times daily.   Cholecalciferol 50 MCG (2000 UT) Caps Take 1 capsule by mouth daily.   digoxin 0.125 MG tablet Commonly known as: LANOXIN Take 1 tablet (125 mcg total) by mouth daily. Started by: Arsh Feutz   dorzolamide-timolol 2-0.5 % ophthalmic solution Commonly known as: COSOPT Place 1 drop into both eyes 2 (two) times daily.   furosemide 20 MG tablet Commonly known as: LASIX Take 1 tablet (20 mg total) by mouth daily. What changed:  medication strength how much to take Changed by: Khamiya Varin   gabapentin 300 MG capsule Commonly known as: Neurontin Take 1 capsule (300 mg total) by mouth 3 (three) times daily.   latanoprost 0.005 % ophthalmic solution Commonly known as: XALATAN Place 1 drop into both eyes at bedtime.   loratadine 10 MG tablet Commonly known as: CLARITIN Take 1 tablet (10 mg total) by mouth daily for 7 days, then as needed thereafter.   lovastatin 40 MG tablet Commonly known as: MEVACOR Take 1 tablet (40 mg total) by mouth at bedtime.   potassium chloride 10 MEQ tablet Commonly known as: KLOR-CON Take 1 tablet (10 mEq total) by mouth daily.   tiZANidine 2 MG tablet Commonly known  as: ZANAFLEX Take by mouth every 6 (six) hours as needed for muscle spasms.   traZODone 50 MG tablet Commonly known as: DESYREL Take 1 tablet (50 mg total) by mouth at bedtime.   triamcinolone cream 0.1 % Commonly known as: KENALOG Apply 1 Application topically 2 (two) times daily. to affected area(s)   Ventolin HFA 108 (90 Base) MCG/ACT inhaler Generic drug: albuterol Inhale 2 puffs into the lungs every 4 (four) hours as needed for wheezing or shortness of breath.  Follow-up: Return in about 1 week (around 02/18/2023).  Mechele Claude, M.D.

## 2023-02-11 NOTE — Patient Instructions (Signed)
Take digoxin I pill every  8 hours for 3 doses. Then switch to taking once a day.

## 2023-02-12 ENCOUNTER — Telehealth: Payer: Self-pay | Admitting: Family Medicine

## 2023-02-12 LAB — CBC WITH DIFFERENTIAL/PLATELET
Basophils Absolute: 0 10*3/uL (ref 0.0–0.2)
Basos: 1 %
EOS (ABSOLUTE): 0.1 10*3/uL (ref 0.0–0.4)
Eos: 1 %
Hematocrit: 40.2 % (ref 37.5–51.0)
Hemoglobin: 13.7 g/dL (ref 13.0–17.7)
Immature Grans (Abs): 0 10*3/uL (ref 0.0–0.1)
Immature Granulocytes: 0 %
Lymphocytes Absolute: 1.7 10*3/uL (ref 0.7–3.1)
Lymphs: 31 %
MCH: 32.4 pg (ref 26.6–33.0)
MCHC: 34.1 g/dL (ref 31.5–35.7)
MCV: 95 fL (ref 79–97)
Monocytes Absolute: 0.6 10*3/uL (ref 0.1–0.9)
Monocytes: 10 %
Neutrophils Absolute: 3.3 10*3/uL (ref 1.4–7.0)
Neutrophils: 57 %
Platelets: 259 10*3/uL (ref 150–450)
RBC: 4.23 x10E6/uL (ref 4.14–5.80)
RDW: 12.3 % (ref 11.6–15.4)
WBC: 5.7 10*3/uL (ref 3.4–10.8)

## 2023-02-12 LAB — CMP14+EGFR
ALT: 11 [IU]/L (ref 0–44)
AST: 12 [IU]/L (ref 0–40)
Albumin: 3.7 g/dL (ref 3.7–4.7)
Alkaline Phosphatase: 69 [IU]/L (ref 44–121)
BUN/Creatinine Ratio: 11 (ref 10–24)
BUN: 10 mg/dL (ref 8–27)
Bilirubin Total: 0.4 mg/dL (ref 0.0–1.2)
CO2: 26 mmol/L (ref 20–29)
Calcium: 9.5 mg/dL (ref 8.6–10.2)
Chloride: 101 mmol/L (ref 96–106)
Creatinine, Ser: 0.93 mg/dL (ref 0.76–1.27)
Globulin, Total: 2.5 g/dL (ref 1.5–4.5)
Glucose: 119 mg/dL — ABNORMAL HIGH (ref 70–99)
Potassium: 4.7 mmol/L (ref 3.5–5.2)
Sodium: 141 mmol/L (ref 134–144)
Total Protein: 6.2 g/dL (ref 6.0–8.5)
eGFR: 79 mL/min/{1.73_m2} (ref >59)

## 2023-02-12 LAB — BRAIN NATRIURETIC PEPTIDE: BNP: 251.4 pg/mL — ABNORMAL HIGH (ref 0.0–100.0)

## 2023-02-13 ENCOUNTER — Telehealth: Payer: Self-pay | Admitting: *Deleted

## 2023-02-13 NOTE — Telephone Encounter (Signed)
Pt's wife called stating this morning she took her husbands BP and it was 160/107 with HR of 116 at 10:20 am. Pt denies chest pain, SOB, dizziness, visual disturbances, sweating, weakness of feeling like he may pass out. I had wife recheck BP while on the phone at 10:46 and BP was 134/81 and 137/79 with HR of 69 and 78. Pt had a cup of coffee prior to his first reading at 10:20. Advised to monitor BP and if he develops any of the above symptoms to call us back or go to ED for evaluation and pt and wife voiced understanding.   Anything else that needs to be advised?

## 2023-02-13 NOTE — Telephone Encounter (Signed)
Sounds perfect!! Thanks.

## 2023-02-14 NOTE — Progress Notes (Signed)
Hello  Jerry Fox,    Your lab result is normal and/or stable.Some minor variations that are not significant are commonly marked abnormal, but do not represent any medical problem for you.   Best regards,  Mechele Claude, M.D.

## 2023-02-18 ENCOUNTER — Ambulatory Visit (INDEPENDENT_AMBULATORY_CARE_PROVIDER_SITE_OTHER): Payer: No Typology Code available for payment source | Admitting: Family Medicine

## 2023-02-18 ENCOUNTER — Encounter: Payer: Self-pay | Admitting: Family Medicine

## 2023-02-18 VITALS — BP 153/82 | HR 93 | Temp 97.6°F | Ht 70.0 in | Wt 245.0 lb

## 2023-02-18 DIAGNOSIS — I4891 Unspecified atrial fibrillation: Secondary | ICD-10-CM | POA: Diagnosis not present

## 2023-02-18 DIAGNOSIS — I952 Hypotension due to drugs: Secondary | ICD-10-CM | POA: Diagnosis not present

## 2023-02-18 NOTE — Progress Notes (Signed)
Subjective:  Patient ID: Jerry Fox, male    DOB: 11-28-1933  Age: 89 y.o. MRN: [id number]  CC: Follow-up and Hypotension   HPI Jerry Fox presents for recheck of his blood pressure. It had fallen so low that his beta blocker had to be stopped last week. To avoid RVR from his a. Fib I added digoxin to his regimn. Today he brings in multiple readings ranging from 80/60 to 160/110 and many in between. He denies any symptoms of dizziness or chest pain. No feeling faint. His wife who is performing the BP checks is doing them routinely, not noting any symptoms.  Today he is in no distress.      02/18/2023    3:53 PM 02/11/2023   10:10 AM 11/26/2022   11:01 AM  Depression screen PHQ 2/9  Decreased Interest 0 0 0  Down, Depressed, Hopeless 0 0 0  PHQ - 2 Score 0 0 0    History Jerry Fox has a past medical history of AAA (abdominal aortic aneurysm) (HCC) (2/14), Arthritis, Atrial fibrillation (HCC) (2/14), Colon polyps, Hyperlipidemia, Hypertension, Murmur, cardiac, and 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   He has a past surgical history that includes Tonsillectomy.   His family history includes Arthritis in his father and mother; Congestive Heart Failure in his brother, sister, and sister; Heart disease in his father, mother, and paternal grandfather; Hypertension in his father; Stroke in his brother.He reports that he quit smoking about 69 years ago. His smoking use included cigarettes. He started smoking about 79 years ago. He has a 10 pack-year smoking history. He quit smokeless tobacco use about 59 years ago.  His smokeless tobacco use included chew. He reports that he does not drink alcohol and does not use drugs.    ROS Review of Systems  Constitutional:  Negative for fever.  Respiratory:  Negative for shortness of breath.   Cardiovascular:  Negative for chest pain.  Musculoskeletal:  Negative for arthralgias.  Skin:  Negative for rash.    Objective:  BP (!)  153/82   Pulse 93   Temp 97.6 F (36.4 C)   Ht 5\' 10"  (1.778 m)   Wt 245 lb (111.1 kg)   SpO2 96%   BMI 35.15 kg/m   BP Readings from Last 3 Encounters:  02/18/23 (!) 153/82  02/11/23 133/89  02/07/23 127/69    Wt Readings from Last 3 Encounters:  02/18/23 245 lb (111.1 kg)  02/11/23 237 lb 9.6 oz (107.8 kg)  11/26/22 238 lb 12.8 oz (108.3 kg)     Physical Exam Vitals reviewed.  Constitutional:      Appearance: He is well-developed.  HENT:     Head: Normocephalic and atraumatic.     Right Ear: External ear normal.     Left Ear: External ear normal.     Mouth/Throat:     Pharynx: No oropharyngeal exudate or posterior oropharyngeal erythema.  Eyes:     Pupils: Pupils are equal, round, and reactive to light.  Cardiovascular:     Rate and Rhythm: Normal rate and regular rhythm.     Heart sounds: No murmur heard. Pulmonary:     Effort: No respiratory distress.     Breath sounds: Normal breath sounds.  Musculoskeletal:     Cervical back: Normal range of motion and neck supple.  Neurological:     Mental Status: He is alert and oriented to person, place, and time.       Assessment & Plan:  Jerry Fox was seen today for follow-up and hypotension.  Diagnoses and all orders for this visit:  Hypotension due to medication -     BMP8+EGFR -     Digoxin level  Atrial fibrillation, unspecified type (HCC) -     BMP8+EGFR -     Digoxin level   Avoid al salt/salty foods unless BP drops below 90/50.    I am having Jerry Fox maintain his dorzolamide-timolol, latanoprost, lovastatin, potassium chloride, apixaban, acetaminophen, triamcinolone cream, albuterol, loratadine, Cholecalciferol, gabapentin, tiZANidine, traZODone, furosemide, and digoxin.  Allergies as of 02/18/2023       Reactions   Diltiazem Rash        Medication List        Accurate as of February 18, 2023  5:08 PM. If you have any questions, ask your nurse or doctor.           acetaminophen 500 MG tablet Commonly known as: TYLENOL Take 1,000 mg by mouth 3 (three) times daily.   apixaban 5 MG Tabs tablet Commonly known as: ELIQUIS Take 1 tablet (5 mg total) by mouth 2 (two) times daily.   Cholecalciferol 50 MCG (2000 UT) Caps Take 1 capsule by mouth daily.   digoxin 0.125 MG tablet Commonly known as: LANOXIN Take 1 tablet (125 mcg total) by mouth daily.   dorzolamide-timolol 2-0.5 % ophthalmic solution Commonly known as: COSOPT Place 1 drop into both eyes 2 (two) times daily.   furosemide 20 MG tablet Commonly known as: LASIX Take 1 tablet (20 mg total) by mouth daily.   gabapentin 300 MG capsule Commonly known as: Neurontin Take 1 capsule (300 mg total) by mouth 3 (three) times daily.   latanoprost 0.005 % ophthalmic solution Commonly known as: XALATAN Place 1 drop into both eyes at bedtime.   loratadine 10 MG tablet Commonly known as: CLARITIN Take 1 tablet (10 mg total) by mouth daily for 7 days, then as needed thereafter.   lovastatin 40 MG tablet Commonly known as: MEVACOR Take 1 tablet (40 mg total) by mouth at bedtime.   potassium chloride 10 MEQ tablet Commonly known as: KLOR-CON Take 1 tablet (10 mEq total) by mouth daily.   tiZANidine 2 MG tablet Commonly known as: ZANAFLEX Take by mouth every 6 (six) hours as needed for muscle spasms.   traZODone 50 MG tablet Commonly known as: DESYREL Take 1 tablet (50 mg total) by mouth at bedtime.   triamcinolone cream 0.1 % Commonly known as: KENALOG Apply 1 Application topically 2 (two) times daily. to affected area(s)   Ventolin HFA 108 (90 Base) MCG/ACT inhaler Generic drug: albuterol Inhale 2 puffs into the lungs every 4 (four) hours as needed for wheezing or shortness of breath.         Follow-up: No follow-ups on file.  Mechele Claude, M.D.

## 2023-02-19 LAB — BMP8+EGFR
BUN/Creatinine Ratio: 8 — ABNORMAL LOW (ref 10–24)
BUN: 8 mg/dL (ref 8–27)
CO2: 25 mmol/L (ref 20–29)
Calcium: 9.4 mg/dL (ref 8.6–10.2)
Chloride: 105 mmol/L (ref 96–106)
Creatinine, Ser: 0.95 mg/dL (ref 0.76–1.27)
Glucose: 93 mg/dL (ref 70–99)
Potassium: 4.9 mmol/L (ref 3.5–5.2)
Sodium: 146 mmol/L — ABNORMAL HIGH (ref 134–144)
eGFR: 77 mL/min/{1.73_m2} (ref >59)

## 2023-02-19 LAB — DIGOXIN LEVEL: Digoxin, Serum: 0.7 ng/mL (ref 0.5–0.9)

## 2023-02-19 NOTE — Progress Notes (Signed)
Hello Jerry Fox,  Your lab result is normal and/or stable.Some minor variations that are not significant are commonly marked abnormal, but do not represent any medical problem for you.  Best regards, Maryah Marinaro, M.D.

## 2023-02-25 ENCOUNTER — Ambulatory Visit: Payer: Medicare PPO | Admitting: Family Medicine

## 2023-02-27 ENCOUNTER — Encounter: Payer: Self-pay | Admitting: Family Medicine

## 2023-03-06 ENCOUNTER — Emergency Department (HOSPITAL_BASED_OUTPATIENT_CLINIC_OR_DEPARTMENT_OTHER): Payer: No Typology Code available for payment source

## 2023-03-06 ENCOUNTER — Encounter (HOSPITAL_BASED_OUTPATIENT_CLINIC_OR_DEPARTMENT_OTHER): Payer: Self-pay | Admitting: Emergency Medicine

## 2023-03-06 ENCOUNTER — Emergency Department (HOSPITAL_BASED_OUTPATIENT_CLINIC_OR_DEPARTMENT_OTHER)
Admission: EM | Admit: 2023-03-06 | Discharge: 2023-03-07 | Disposition: A | Discharge status: Home / Self Care | Payer: No Typology Code available for payment source | Attending: Emergency Medicine | Admitting: Emergency Medicine

## 2023-03-06 ENCOUNTER — Other Ambulatory Visit: Payer: Self-pay

## 2023-03-06 DIAGNOSIS — I10 Essential (primary) hypertension: Secondary | ICD-10-CM | POA: Diagnosis not present

## 2023-03-06 DIAGNOSIS — I6782 Cerebral ischemia: Secondary | ICD-10-CM | POA: Diagnosis not present

## 2023-03-06 DIAGNOSIS — R0989 Other specified symptoms and signs involving the circulatory and respiratory systems: Secondary | ICD-10-CM | POA: Diagnosis not present

## 2023-03-06 DIAGNOSIS — R001 Bradycardia, unspecified: Secondary | ICD-10-CM | POA: Insufficient documentation

## 2023-03-06 DIAGNOSIS — R630 Anorexia: Secondary | ICD-10-CM | POA: Diagnosis not present

## 2023-03-06 DIAGNOSIS — I672 Cerebral atherosclerosis: Secondary | ICD-10-CM | POA: Diagnosis not present

## 2023-03-06 DIAGNOSIS — Z7901 Long term (current) use of anticoagulants: Secondary | ICD-10-CM | POA: Diagnosis not present

## 2023-03-06 DIAGNOSIS — M7989 Other specified soft tissue disorders: Secondary | ICD-10-CM | POA: Insufficient documentation

## 2023-03-06 DIAGNOSIS — Z79899 Other long term (current) drug therapy: Secondary | ICD-10-CM | POA: Diagnosis not present

## 2023-03-06 DIAGNOSIS — I700 Atherosclerosis of aorta: Secondary | ICD-10-CM | POA: Diagnosis not present

## 2023-03-06 DIAGNOSIS — R410 Disorientation, unspecified: Secondary | ICD-10-CM | POA: Insufficient documentation

## 2023-03-06 DIAGNOSIS — R531 Weakness: Secondary | ICD-10-CM | POA: Diagnosis not present

## 2023-03-06 DIAGNOSIS — F29 Unspecified psychosis not due to a substance or known physiological condition: Secondary | ICD-10-CM | POA: Diagnosis not present

## 2023-03-06 DIAGNOSIS — R443 Hallucinations, unspecified: Secondary | ICD-10-CM | POA: Insufficient documentation

## 2023-03-06 LAB — COMPREHENSIVE METABOLIC PANEL
ALT: 9 U/L (ref 0–44)
AST: 14 U/L — ABNORMAL LOW (ref 15–41)
Albumin: 3.5 g/dL (ref 3.5–5.0)
Alkaline Phosphatase: 39 U/L (ref 38–126)
Anion gap: 7 (ref 5–15)
BUN: 13 mg/dL (ref 8–23)
CO2: 27 mmol/L (ref 22–32)
Calcium: 9.2 mg/dL (ref 8.9–10.3)
Chloride: 102 mmol/L (ref 98–111)
Creatinine, Ser: 0.78 mg/dL (ref 0.61–1.24)
GFR, Estimated: >60 mL/min (ref >60)
Glucose, Bld: 137 mg/dL — ABNORMAL HIGH (ref 70–99)
Potassium: 4 mmol/L (ref 3.5–5.1)
Sodium: 136 mmol/L (ref 135–145)
Total Bilirubin: 0.4 mg/dL (ref <1.2)
Total Protein: 6.5 g/dL (ref 6.5–8.1)

## 2023-03-06 LAB — CBC WITH DIFFERENTIAL/PLATELET
Abs Immature Granulocytes: 0.02 10*3/uL (ref 0.00–0.07)
Basophils Absolute: 0 10*3/uL (ref 0.0–0.1)
Basophils Relative: 1 %
Eosinophils Absolute: 0.1 10*3/uL (ref 0.0–0.5)
Eosinophils Relative: 2 %
HCT: 36.5 % — ABNORMAL LOW (ref 39.0–52.0)
Hemoglobin: 11.9 g/dL — ABNORMAL LOW (ref 13.0–17.0)
Immature Granulocytes: 0 %
Lymphocytes Relative: 40 %
Lymphs Abs: 2.5 10*3/uL (ref 0.7–4.0)
MCH: 31.6 pg (ref 26.0–34.0)
MCHC: 32.6 g/dL (ref 30.0–36.0)
MCV: 97.1 fL (ref 80.0–100.0)
Monocytes Absolute: 0.7 10*3/uL (ref 0.1–1.0)
Monocytes Relative: 11 %
Neutro Abs: 3 10*3/uL (ref 1.7–7.7)
Neutrophils Relative %: 46 %
Platelets: 238 10*3/uL (ref 150–400)
RBC: 3.76 MIL/uL — ABNORMAL LOW (ref 4.22–5.81)
RDW: 14.4 % (ref 11.5–15.5)
WBC: 6.3 10*3/uL (ref 4.0–10.5)
nRBC: 0 % (ref 0.0–0.2)

## 2023-03-06 LAB — ETHANOL: Alcohol, Ethyl (B): <10 mg/dL (ref <10)

## 2023-03-06 LAB — LACTIC ACID, PLASMA: Lactic Acid, Venous: 1.5 mmol/L (ref 0.5–1.9)

## 2023-03-06 NOTE — ED Notes (Signed)
Patient transported to CT 

## 2023-03-06 NOTE — ED Provider Notes (Signed)
[place] EMERGENCY DEPARTMENT AT [facility] Provider Note   CSN: [id number] Arrival date & time: 03/06/23  2124     History  Chief Complaint  Patient presents with   Hypotension   Hallucinations    Jerry Fox is a 89 y.o. male.  HPI Patient presents feeling confused with some hypotension.  Does have a history of recurrent hypotension has had decrease in his medications.  Has had a somewhat decreased oral intake.  Blood pressures down in the 100 systolic today when he was up more 180s recently.  However has been more confused.  States he is hallucinating.  States he has been seeing people that are not there.  Also saw his she was moving when they were not moving on their own.  Awake and appropriate.  No real abdominal pain.  Chronic swelling on his legs.  History of A-fib but is on anticoagulation.   Past Medical History:  Diagnosis Date   AAA (abdominal aortic aneurysm) (HCC) 2/14   3.8 cm 2/14   Arthritis    Atrial fibrillation (HCC) 2/14   acute pulmonary embolus   Colon polyps    Hyperlipidemia    Hypertension    Murmur, cardiac    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   LLE DVT    Home Medications Prior to Admission medications   Medication Sig Start Date End Date Taking? Authorizing Provider  acetaminophen (TYLENOL) 500 MG tablet Take 1,000 mg by mouth 3 (three) times daily.    [provider]  albuterol (VENTOLIN HFA) 108 (90 Base) MCG/ACT inhaler Inhale 2 puffs into the lungs every 4 (four) hours as needed for wheezing or shortness of breath. 10/26/22 11/25/22  Lanae Boast, MD  apixaban (ELIQUIS) 5 MG TABS tablet Take 1 tablet (5 mg total) by mouth 2 (two) times daily. 05/28/22   Mechele Claude, MD  Cholecalciferol 50 MCG (2000 UT) CAPS Take 1 capsule by mouth daily.    [provider]  digoxin (LANOXIN) 0.125 MG tablet Take 1 tablet (125 mcg total) by mouth daily. 02/11/23   Mechele Claude, MD  dorzolamide-timolol (COSOPT) 22.3-6.8  MG/ML ophthalmic solution Place 1 drop into both eyes 2 (two) times daily. 10/26/15   [provider]  furosemide (LASIX) 20 MG tablet Take 1 tablet (20 mg total) by mouth daily. 02/11/23   Mechele Claude, MD  gabapentin (NEURONTIN) 300 MG capsule Take 1 capsule (300 mg total) by mouth 3 (three) times daily. 11/26/22   Mechele Claude, MD  latanoprost (XALATAN) 0.005 % ophthalmic solution Place 1 drop into both eyes at bedtime. 10/26/15   [provider]  loratadine (CLARITIN) 10 MG tablet Take 1 tablet (10 mg total) by mouth daily for 7 days, then as needed thereafter. 10/27/22 02/03/23  Lanae Boast, MD  lovastatin (MEVACOR) 40 MG tablet Take 1 tablet (40 mg total) by mouth at bedtime. 05/28/22   Mechele Claude, MD  potassium chloride (KLOR-CON) 10 MEQ tablet Take 1 tablet (10 mEq total) by mouth daily. 05/28/22   Mechele Claude, MD  tiZANidine (ZANAFLEX) 2 MG tablet Take by mouth every 6 (six) hours as needed for muscle spasms.    [provider]  traZODone (DESYREL) 50 MG tablet Take 1 tablet (50 mg total) by mouth at bedtime. 02/11/23   Mechele Claude, MD  triamcinolone cream (KENALOG) 0.1 % Apply 1 Application topically 2 (two) times daily. to affected area(s) 10/11/22   [provider]      Allergies  Diltiazem    Review of Systems   Review of Systems  Physical Exam Updated Vital Signs BP 117/60 (BP Location: Right Arm)   Pulse 70   Temp 98 F (36.7 C) (Oral)   Resp 14   SpO2 98%  Physical Exam Vitals reviewed.  HENT:     Head: Normocephalic.  Eyes:     Pupils: Pupils are equal, round, and reactive to light.  Cardiovascular:     Rate and Rhythm: Bradycardia present. Rhythm irregular.  Abdominal:     Comments: Mild distention.  Chronic per patient.  Musculoskeletal:     Cervical back: Neck supple.     Right lower leg: Edema present.     Left lower leg: Edema present.  Neurological:     Mental Status: He is oriented to person, place, and time.      Comments: Awake and pleasant, however has been hallucinating.     ED Results / Procedures / Treatments   Labs (all labs ordered are listed, but only abnormal results are displayed) Labs Reviewed  COMPREHENSIVE METABOLIC PANEL - Abnormal; Notable for the following components:      Result Value   Glucose, Bld 137 (*)    AST 14 (*)    All other components within normal limits  CBC WITH DIFFERENTIAL/PLATELET - Abnormal; Notable for the following components:   RBC 3.76 (*)    Hemoglobin 11.9 (*)    HCT 36.5 (*)    All other components within normal limits  CULTURE, BLOOD (ROUTINE X 2)  CULTURE, BLOOD (ROUTINE X 2)  LACTIC ACID, PLASMA  ETHANOL  LACTIC ACID, PLASMA  URINALYSIS, W/ REFLEX TO CULTURE (INFECTION SUSPECTED)  RAPID URINE DRUG SCREEN, HOSP PERFORMED  DIGOXIN LEVEL    EKG EKG Interpretation Date/Time:  Wednesday March 06 2023 22:33:50 EST Ventricular Rate:  60 PR Interval:    QRS Duration:  115 QT Interval:  425 QTC Calculation: 421 R Axis:   19  Text Interpretation: Atrial fibrillation Nonspecific intraventricular conduction delay pause under 2 seconds Confirmed by Benjiman Core [phone]) on 03/06/2023 10:43:09 PM  Radiology CT Head Wo Contrast  Result Date: 03/06/2023 CLINICAL DATA:  Psychosis, hypotension, hallucinations EXAM: CT HEAD WITHOUT CONTRAST TECHNIQUE: Contiguous axial images were obtained from the base of the skull through the vertex without intravenous contrast. RADIATION DOSE REDUCTION: This exam was performed according to the departmental dose-optimization program which includes automated exposure control, adjustment of the mA and/or kV according to patient size and/or use of iterative reconstruction technique. COMPARISON:  None Available. FINDINGS: Brain: No evidence of acute infarction, hemorrhage, hydrocephalus, extra-axial collection or mass lesion/mass effect. Global cortical and central atrophy. Subcortical white matter and  periventricular small vessel ischemic changes. Vascular: Intracranial atherosclerosis. Skull: Normal. Negative for fracture or focal lesion. Sinuses/Orbits: The visualized paranasal sinuses are essentially clear. The mastoid air cells are unopacified. Other: None. IMPRESSION: No acute intracranial abnormality. Atrophy with small vessel ischemic changes. Electronically Signed   By: Charline Bills M.D.   On: 03/06/2023 23:02   DG Chest Portable 1 View  Result Date: 03/06/2023 CLINICAL DATA:  Weakness. EXAM: PORTABLE CHEST 1 VIEW COMPARISON:  02/07/2023. FINDINGS: The heart size and mediastinal contours are within normal limits. There is atherosclerotic calcification of the aorta. Lung volumes are low. No consolidation, effusion, or pneumothorax. No acute osseous abnormality. IMPRESSION: No active disease. Electronically Signed   By: Thornell Sartorius M.D.   On: 03/06/2023 22:54    Procedures Procedures    Medications Ordered in  ED Medications - No data to display  ED Course/ Medical Decision Making/ A&P                                 Medical Decision Making Amount and/or Complexity of Data Reviewed Labs: ordered. Radiology: ordered.   Patient with mental status change.  Some hallucinations.  Also has been feeling somewhat weak.  Somewhat decreased oral intake.  Differential diagnoses long but includes medication reaction, dehydration, infection.  Head CT done and reassuring.  Blood pressure been more reassuring here.  Has had previous history is of hypotension.  Has had bradycardia.  A-fib.  Bradycardia down to the 30s with pauses up to 2 seconds.  Lab work reassuring.  Head CT reassuring.  Urinalysis pending at this time.  Care turned over to Dr.DeLo          Final Clinical Impression(s) / ED Diagnoses Final diagnoses:  Hallucinations  Bradycardia    Rx / DC Orders ED Discharge Orders     None         Benjiman Core, MD 03/06/23 2351

## 2023-03-07 ENCOUNTER — Ambulatory Visit (INDEPENDENT_AMBULATORY_CARE_PROVIDER_SITE_OTHER): Payer: No Typology Code available for payment source | Admitting: Family Medicine

## 2023-03-07 ENCOUNTER — Encounter: Payer: Self-pay | Admitting: Family Medicine

## 2023-03-07 VITALS — BP 114/73 | HR 82 | Temp 98.2°F | Ht 70.0 in | Wt 234.6 lb

## 2023-03-07 DIAGNOSIS — I10 Essential (primary) hypertension: Secondary | ICD-10-CM

## 2023-03-07 DIAGNOSIS — I480 Paroxysmal atrial fibrillation: Secondary | ICD-10-CM

## 2023-03-07 DIAGNOSIS — Z23 Encounter for immunization: Secondary | ICD-10-CM

## 2023-03-07 DIAGNOSIS — I952 Hypotension due to drugs: Secondary | ICD-10-CM | POA: Diagnosis not present

## 2023-03-07 LAB — URINALYSIS, W/ REFLEX TO CULTURE (INFECTION SUSPECTED)
Bacteria, UA: NONE SEEN
Bilirubin Urine: NEGATIVE
Glucose, UA: NEGATIVE mg/dL
Hgb urine dipstick: NEGATIVE
Ketones, ur: NEGATIVE mg/dL
Leukocytes,Ua: NEGATIVE
Nitrite: NEGATIVE
Protein, ur: NEGATIVE mg/dL
Specific Gravity, Urine: 1.011 (ref 1.005–1.030)
pH: 5 (ref 5.0–8.0)

## 2023-03-07 LAB — RAPID URINE DRUG SCREEN, HOSP PERFORMED
Amphetamines: NOT DETECTED
Barbiturates: NOT DETECTED
Benzodiazepines: NOT DETECTED
Cocaine: NOT DETECTED
Opiates: NOT DETECTED
Tetrahydrocannabinol: NOT DETECTED

## 2023-03-07 LAB — DIGOXIN LEVEL: Digoxin Level: 0.8 ng/mL (ref 0.8–2.0)

## 2023-03-07 MED ORDER — LISINOPRIL 10 MG PO TABS: 10.0000 mg | ORAL_TABLET | Freq: Every day | ORAL | 3 refills | Status: DC

## 2023-03-07 NOTE — Progress Notes (Signed)
Subjective:  Patient ID: Jerry Fox, male    DOB: 08/03/1933  Age: 89 y.o. MRN: [id number]  CC: Follow-up (hypotension)   HPI Jerry Fox presents for follow up on dropping blood pressure. No syncope. Wife Dced the metoprolol. Pulse has been controlled bby digoxin. None over 100 noted. He continues lasix due to edema.   Atrial fibrillation follow up. Pt. is treated with rate control and anticoagulation. Pt.  denies palpitations, rapid rate, chest pain, dyspnea and edema. There has been no bleeding from nose or gums. Pt. has not noticed blood with urine or stool.  Although there is routine bruising easily, it is not excessive.      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   He has a past surgical history that includes Tonsillectomy.   His family history includes Arthritis in his father and mother; Congestive Heart Failure in his brother, sister, and sister; Heart disease in his father, mother, and paternal grandfather; Hypertension in his father; Stroke in his brother.He reports that he quit smoking about 69 years ago. His smoking use included cigarettes. He started smoking about 79 years ago. He has a 10 pack-year smoking history. He quit smokeless tobacco use about 59 years ago.  His smokeless tobacco use included chew. He reports that he does not drink alcohol and does not use drugs.    ROS Review of Systems  Constitutional:  Negative for fever.  Respiratory:  Negative for shortness of breath.   Cardiovascular:  Negative for chest pain.  Musculoskeletal:  Negative for arthralgias.  Skin:   Negative for rash.    Objective:  BP 114/73   Pulse 82   Temp 98.2 F (36.8 C)   Ht 5\' 10"  (1.778 m)   Wt 234 lb 9.6 oz (106.4 kg)   SpO2 94%   BMI 33.66 kg/m   BP Readings from Last 3 Encounters:  03/07/23 114/73  03/07/23 (!) 151/89  02/18/23 (!) 153/82    Wt Readings from Last 3 Encounters:  03/07/23 234 lb 9.6 oz (106.4 kg)  02/18/23 245 lb (111.1 kg)  02/11/23 237 lb 9.6 oz (107.8 kg)     Physical Exam Vitals reviewed.  Constitutional:      Appearance: He is well-developed.  HENT:     Head: Normocephalic and atraumatic.     Right Ear: External ear normal.     Left Ear: External ear normal.     Mouth/Throat:     Pharynx: No oropharyngeal exudate or posterior oropharyngeal erythema.  Eyes:     Pupils: Pupils are equal, round, and reactive to light.  Cardiovascular:     Rate and Rhythm: Normal rate and regular rhythm.     Heart sounds: No murmur heard. Pulmonary:     Effort: No respiratory distress.     Breath sounds: Normal breath sounds.  Musculoskeletal:     Cervical back: Normal range of motion and neck supple.  Neurological:     Mental Status: He is alert and oriented to person, place, and time.       Assessment & Plan:  Jerry Fox was seen today for follow-up.  Diagnoses and all orders for this visit:  Need for influenza vaccination -     Flu Vaccine Trivalent High Dose (Fluad)  Primary hypertension  Hypotension due to medication  Paroxysmal atrial fibrillation (HCC)  Other orders -     lisinopril (ZESTRIL) 10 MG tablet; Take 1 tablet (10 mg total) by mouth daily.       I am having Jerry Fox start on lisinopril. I am also having him maintain his dorzolamide-timolol, latanoprost, lovastatin, potassium chloride, apixaban, acetaminophen, triamcinolone cream, albuterol, loratadine, Cholecalciferol, gabapentin, tiZANidine, traZODone, furosemide, and digoxin.  Allergies as of 03/07/2023       Reactions   Diltiazem Rash         Medication List        Accurate as of March 07, 2023 11:59 PM. If you have any questions, ask your nurse or doctor.          acetaminophen 500 MG tablet Commonly known as: TYLENOL Take 1,000 mg by mouth 3 (three) times daily.   apixaban 5 MG Tabs tablet Commonly known as: ELIQUIS Take 1 tablet (5 mg total) by mouth 2 (two) times daily.   Cholecalciferol 50 MCG (2000 UT) Caps Take 1 capsule by mouth daily.   digoxin 0.125 MG tablet Commonly known as: LANOXIN Take 1 tablet (125 mcg total) by mouth daily.   dorzolamide-timolol 2-0.5 % ophthalmic solution Commonly known as: COSOPT Place 1 drop into both eyes 2 (two) times daily.   furosemide 20 MG tablet Commonly known as: LASIX Take 1 tablet (20 mg total) by mouth daily.   gabapentin 300 MG capsule Commonly known as: Neurontin Take 1 capsule (300 mg total) by mouth 3 (three) times daily.   latanoprost 0.005 % ophthalmic solution Commonly known as: XALATAN Place 1 drop into both eyes at bedtime.   lisinopril 10 MG tablet Commonly known as: ZESTRIL Take 1 tablet (10 mg total) by mouth daily. Started by: Priyanka Causey   loratadine 10 MG tablet Commonly known as: CLARITIN Take 1 tablet (10 mg total) by mouth daily for 7 days, then as needed thereafter.   lovastatin 40 MG tablet Commonly known as: MEVACOR Take 1 tablet (40 mg total) by mouth at bedtime.   potassium chloride 10 MEQ tablet Commonly known as: KLOR-CON Take 1 tablet (10 mEq total) by mouth daily.   tiZANidine 2 MG tablet Commonly known as: ZANAFLEX Take by mouth every 6 (six) hours as needed for muscle spasms.   traZODone 50 MG tablet Commonly known as: DESYREL Take 1 tablet (50 mg total) by mouth at bedtime.   triamcinolone cream 0.1 % Commonly known as: KENALOG Apply 1 Application topically 2 (two) times daily. to affected area(s)   Ventolin HFA 108 (90 Base) MCG/ACT inhaler Generic drug: albuterol Inhale 2 puffs into the lungs every  4 (four) hours as needed for wheezing or shortness of breath.         Follow-up: Return in about 1 month (around 04/06/2023).  Mechele Claude, M.D.

## 2023-03-07 NOTE — Discharge Instructions (Signed)
Continue medications as previously prescribed.  Return to the ER if you develop severe headache, chest pains, difficulty breathing, or for other new and concerning symptoms.

## 2023-03-07 NOTE — ED Provider Notes (Signed)
  Physical Exam  BP 117/60 (BP Location: Right Arm)   Pulse 70   Temp 98 F (36.7 C) (Oral)   Resp 14   SpO2 98%   Physical Exam Vitals and nursing note reviewed.  Constitutional:      General: He is not in acute distress.    Appearance: He is well-developed. He is not diaphoretic.  HENT:     Head: Normocephalic and atraumatic.  Cardiovascular:     Rate and Rhythm: Normal rate and regular rhythm.     Heart sounds: No murmur heard.    No friction rub.  Pulmonary:     Effort: Pulmonary effort is normal. No respiratory distress.     Breath sounds: Normal breath sounds. No wheezing or rales.  Abdominal:     General: Bowel sounds are normal. There is no distension.     Palpations: Abdomen is soft.     Tenderness: There is no abdominal tenderness.  Musculoskeletal:        General: Normal range of motion.     Cervical back: Normal range of motion and neck supple.  Skin:    General: Skin is warm and dry.  Neurological:     Mental Status: He is alert and oriented to person, place, and time.     Coordination: Coordination normal.     Procedures  Procedures  ED Course / MDM    Medical Decision Making Amount and/or Complexity of Data Reviewed Labs: ordered. Radiology: ordered.   Care assumed from Dr. Rubin Payor at shift change.  Patient signed out to me awaiting results of urinalysis and laboratory studies.  Patient initially presenting with complaints of low heart rate and visual hallucinations as described in Dr. Arlington Calix note.  Patient's laboratory studies have resulted and are all basically unremarkable.  CBC, BMP, lactate, EtOH, dig level, are all normal.  Urinalysis is clear.  CT scan of the head shows no acute intracranial process and chest x-ray shows no active disease.  I had a lengthy discussion with the patient and his son at bedside.  As I have found nothing acute to fix, I feel as though patient can safely be discharged with close outpatient follow-up.        Geoffery Lyons, MD 03/07/23 [phone]

## 2023-03-10 ENCOUNTER — Encounter: Payer: Self-pay | Admitting: Family Medicine

## 2023-03-12 LAB — CULTURE, BLOOD (ROUTINE X 2)
Culture: NO GROWTH
Culture: NO GROWTH
Special Requests: ADEQUATE

## 2023-03-27 ENCOUNTER — Telehealth: Payer: Self-pay

## 2023-03-27 NOTE — Telephone Encounter (Signed)
Transition Care Management Unsuccessful Follow-up Telephone Call  Date of discharge and from where:  [facility] 11/7  Attempts:  1st Attempt  Reason for unsuccessful TCM follow-up call:  No answer/busy   Lenard Forth [place]  [facility], [facility] Guide, Phone: [phone] Website: [email or web address]

## 2023-03-29 ENCOUNTER — Telehealth: Payer: Self-pay

## 2023-03-29 NOTE — Telephone Encounter (Signed)
 Transition Care Management Unsuccessful Follow-up Telephone Call  Date of discharge and from where:  [facility] 11/7  Attempts:  2nd Attempt  Reason for unsuccessful TCM follow-up call:  No answer/busy   Lenard Forth [place]  [facility], [facility] Guide, Phone: [phone] Website: [email or web address]

## 2023-04-04 ENCOUNTER — Encounter: Payer: Self-pay | Admitting: Family Medicine

## 2023-04-04 ENCOUNTER — Ambulatory Visit (INDEPENDENT_AMBULATORY_CARE_PROVIDER_SITE_OTHER): Payer: No Typology Code available for payment source | Admitting: Family Medicine

## 2023-04-04 VITALS — BP 114/70 | HR 84 | Temp 97.9°F | Ht 70.0 in | Wt 231.5 lb

## 2023-04-04 DIAGNOSIS — I495 Sick sinus syndrome: Secondary | ICD-10-CM

## 2023-04-04 DIAGNOSIS — I950 Idiopathic hypotension: Secondary | ICD-10-CM | POA: Diagnosis not present

## 2023-04-04 DIAGNOSIS — I480 Paroxysmal atrial fibrillation: Secondary | ICD-10-CM | POA: Diagnosis not present

## 2023-04-04 DIAGNOSIS — I10 Essential (primary) hypertension: Secondary | ICD-10-CM

## 2023-04-04 NOTE — Patient Instructions (Addendum)
Give the Lisinopril every day first thing in the morning. DC the digoxin completely. All other mdications are unchanged  Give Gatorade if BP is under 90/50, until it goes back over 105/60

## 2023-04-04 NOTE — Progress Notes (Signed)
Subjective:  Patient ID: Jerry Fox, male    DOB: August 03, 1933  Age: 89 y.o. MRN: [id number]  CC: Hypotension   HPI Jerry Fox presents for Blood pressure and puls having wide swings. Pulse varying fron 40s to 120s and BP from 80/50 to 189/110. Wife admins meds. Is holding the lisinopril at times for low BP. Stopped the digoxin 1 week ago completely.   Pt. participates minimally in history. Cooperative with exam. No association with activity as pt. Is sedentary. No observed association with food.   Atrial fibrillation follow up. Pt. is treated with rate control and anticoagulation. Pt.  denies palpitations, rapid rate, chest pain, dyspnea and edema. There has been no bleeding from nose or gums. Pt. has not noticed blood with urine or stool.  Although there is routine bruising easily, it is not excessive.   History Jerry Fox has a past medical history of AAA (abdominal aortic aneurysm) (HCC) (2/14), Arthritis, Atrial fibrillation (HCC) (2/14), Colon polyps, Hyperlipidemia, Hypertension, Murmur, cardiac, and 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   He has a past surgical history that includes Tonsillectomy.   His family history includes Arthritis in his father and mother; Congestive Heart Failure in his brother, sister, and sister; Heart disease in his father, mother, and paternal grandfather; Hypertension in his father; Stroke in his brother.He reports that he quit smoking about 69 years ago. His smoking use included cigarettes. He started smoking about 79 years ago. He has a 10 pack-year smoking history. He quit smokeless tobacco use about 59 years ago.  His smokeless tobacco use included chew. He reports that he does not drink alcohol and does not use drugs.    ROS Review of Systems  Constitutional:  Negative for fever.  Respiratory:  Negative for shortness of breath.   Cardiovascular:  Negative for chest pain.  Musculoskeletal:  Negative for arthralgias.  Skin:  Negative for  rash.    Objective:  BP 114/70   Pulse 84   Temp 97.9 F (36.6 C) (Temporal)   Ht 5\' 10"  (1.778 m)   Wt 231 lb 8 oz (105 kg)   SpO2 96%   BMI 33.22 kg/m   BP Readings from Last 3 Encounters:  04/04/23 114/70  03/07/23 114/73  03/07/23 (!) 151/89    Wt Readings from Last 3 Encounters:  04/04/23 231 lb 8 oz (105 kg)  03/07/23 234 lb 9.6 oz (106.4 kg)  02/18/23 245 lb (111.1 kg)     Physical Exam Vitals reviewed.  Constitutional:      Appearance: He is well-developed.  HENT:     Head: Normocephalic and atraumatic.     Right Ear: External ear normal.     Left Ear: External ear normal.     Mouth/Throat:     Pharynx: No oropharyngeal exudate or posterior oropharyngeal erythema.  Eyes:     Pupils: Pupils are equal, round, and reactive to light.  Cardiovascular:     Rate and Rhythm: Normal rate and regular rhythm.     Heart sounds: No murmur heard. Pulmonary:     Effort: No respiratory distress.     Breath sounds: Normal breath sounds.  Musculoskeletal:     Cervical back: Normal range of motion and neck supple.  Neurological:     Mental Status: He is alert and oriented to person, place, and time.       Assessment & Plan:   Jerry Fox was seen today for hypotension.  Diagnoses and all orders for this visit:  Primary  hypertension -     Ambulatory referral to Cardiology -     TSH + free T4 -     Cortisol -     Catecholamines, Fractionated, Plasma -     Metanephrines, Pheochromocyt  Idiopathic hypotension -     Ambulatory referral to Cardiology -     TSH + free T4 -     Cortisol -     Catecholamines, Fractionated, Plasma -     Metanephrines, Pheochromocyt  Tachycardia-bradycardia (HCC) -     Ambulatory referral to Cardiology -     TSH + free T4 -     Cortisol -     Catecholamines, Fractionated, Plasma -     Metanephrines, Pheochromocyt  Paroxysmal atrial fibrillation (HCC) -     Ambulatory referral to Cardiology -     TSH + free T4 -     Cortisol -      Catecholamines, Fractionated, Plasma -     Metanephrines, Pheochromocyt       I am having Jerry Fox maintain his dorzolamide-timolol, latanoprost, lovastatin, potassium chloride, apixaban, acetaminophen, triamcinolone cream, albuterol, loratadine, Cholecalciferol, gabapentin, tiZANidine, traZODone, furosemide, digoxin, and lisinopril.  Allergies as of 04/04/2023       Reactions   Diltiazem Rash        Medication List        Accurate as of April 04, 2023 11:59 PM. If you have any questions, ask your nurse or doctor.          acetaminophen 500 MG tablet Commonly known as: TYLENOL Take 1,000 mg by mouth 3 (three) times daily.   apixaban 5 MG Tabs tablet Commonly known as: ELIQUIS Take 1 tablet (5 mg total) by mouth 2 (two) times daily.   Cholecalciferol 50 MCG (2000 UT) Caps Take 1 capsule by mouth daily.   digoxin 0.125 MG tablet Commonly known as: LANOXIN Take 1 tablet (125 mcg total) by mouth daily.   dorzolamide-timolol 2-0.5 % ophthalmic solution Commonly known as: COSOPT Place 1 drop into both eyes 2 (two) times daily.   furosemide 20 MG tablet Commonly known as: LASIX Take 1 tablet (20 mg total) by mouth daily.   gabapentin 300 MG capsule Commonly known as: Neurontin Take 1 capsule (300 mg total) by mouth 3 (three) times daily.   latanoprost 0.005 % ophthalmic solution Commonly known as: XALATAN Place 1 drop into both eyes at bedtime.   lisinopril 10 MG tablet Commonly known as: ZESTRIL Take 1 tablet (10 mg total) by mouth daily.   loratadine 10 MG tablet Commonly known as: CLARITIN Take 1 tablet (10 mg total) by mouth daily for 7 days, then as needed thereafter.   lovastatin 40 MG tablet Commonly known as: MEVACOR Take 1 tablet (40 mg total) by mouth at bedtime.   potassium chloride 10 MEQ tablet Commonly known as: KLOR-CON Take 1 tablet (10 mEq total) by mouth daily.   tiZANidine 2 MG tablet Commonly known as:  ZANAFLEX Take by mouth every 6 (six) hours as needed for muscle spasms.   traZODone 50 MG tablet Commonly known as: DESYREL Take 1 tablet (50 mg total) by mouth at bedtime.   triamcinolone cream 0.1 % Commonly known as: KENALOG Apply 1 Application topically 2 (two) times daily. to affected area(s)   Ventolin HFA 108 (90 Base) MCG/ACT inhaler Generic drug: albuterol Inhale 2 puffs into the lungs every 4 (four) hours as needed for wheezing or shortness of breath.  Follow-up: Return in about 2 weeks (around 04/18/2023).  Mechele Claude, M.D.

## 2023-04-10 ENCOUNTER — Telehealth: Payer: Self-pay | Admitting: Family Medicine

## 2023-04-10 NOTE — Telephone Encounter (Unsigned)
Copied from CRM [id number]. Topic: General - Other >> Apr 10, 2023 10:30 AM Clayton Bibles wrote: Reason for CRM: He needs a referral to [facility], fax # [phone] so he can see the heart doctor.

## 2023-04-11 ENCOUNTER — Other Ambulatory Visit: Payer: Self-pay | Admitting: Family Medicine

## 2023-04-11 DIAGNOSIS — I480 Paroxysmal atrial fibrillation: Secondary | ICD-10-CM

## 2023-04-11 DIAGNOSIS — I950 Idiopathic hypotension: Secondary | ICD-10-CM

## 2023-04-11 NOTE — Telephone Encounter (Signed)
Referral placed, as requested WS 

## 2023-04-11 NOTE — Telephone Encounter (Signed)
Referral placed.

## 2023-05-07 ENCOUNTER — Encounter: Payer: Self-pay | Admitting: Family Medicine

## 2023-05-07 ENCOUNTER — Ambulatory Visit: Payer: No Typology Code available for payment source | Admitting: Family Medicine

## 2023-05-07 VITALS — BP 136/71 | HR 88 | Temp 96.7°F | Ht 70.0 in | Wt 210.2 lb

## 2023-05-07 DIAGNOSIS — G8929 Other chronic pain: Secondary | ICD-10-CM | POA: Diagnosis not present

## 2023-05-07 DIAGNOSIS — M545 Low back pain, unspecified: Secondary | ICD-10-CM

## 2023-05-07 DIAGNOSIS — E785 Hyperlipidemia, unspecified: Secondary | ICD-10-CM | POA: Diagnosis not present

## 2023-05-07 DIAGNOSIS — I480 Paroxysmal atrial fibrillation: Secondary | ICD-10-CM

## 2023-05-07 DIAGNOSIS — I10 Essential (primary) hypertension: Secondary | ICD-10-CM

## 2023-05-07 DIAGNOSIS — M47816 Spondylosis without myelopathy or radiculopathy, lumbar region: Secondary | ICD-10-CM

## 2023-05-07 MED ORDER — GABAPENTIN 300 MG PO CAPS: 600.0000 mg | ORAL_CAPSULE | Freq: Two times a day (BID) | ORAL | 1 refills | Status: DC

## 2023-05-07 NOTE — Progress Notes (Signed)
 Subjective:  Patient ID: Jerry Fox, male    DOB: [birth year]  Age: 89 y.o. MRN: [id number]  CC: Hypertension (2 week follow up )   HPI Jerry Fox presents for  presents for  follow-up of hypertension. Patient has no history of headache chest pain or shortness of breath or recent cough. Patient also denies symptoms of TIA such as focal numbness or weakness. Patient denies side effects from medication. States taking it regularly. BP went high, unclear how high, but wife gave him tizanidine  and it came down. IT doesn't give him pain relief. He uses gabapentin  and gets relief , but incomplete.   Atrial fibrillation follow up. Pt. is treated with rate control and anticoagulation. Pt.  denies palpitations, rapid rate, chest pain, dyspnea and edema. There has been no bleeding from nose or gums. Pt. has not noticed blood with urine or stool.  Although there is routine bruising easily, it is not excessive.   Wife concerned about weight loss. His appetite is good and his edema is much better. Continues to take diuresis.        05/07/2023    3:31 PM 03/07/2023    3:11 PM 03/07/2023    2:59 PM  Depression screen PHQ 2/9  Decreased Interest 0 0 0  Down, Depressed, Hopeless 0 0 0  PHQ - 2 Score 0 0 0  Altered sleeping 0    Tired, decreased energy 0    Change in appetite 0    Feeling bad or failure about yourself  0    Trouble concentrating 0    Moving slowly or fidgety/restless 0    Suicidal thoughts 0    PHQ-9 Score 0    Difficult doing work/chores Not difficult at all      History Jerry Fox has a past medical history of AAA (abdominal aortic aneurysm) (HCC) (2/14), Arthritis, Atrial fibrillation (HCC) (2/14), Colon polyps, Hyperlipidemia, Hypertension, Murmur, cardiac, and 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   He has a past surgical history that includes Tonsillectomy.   His family history includes Arthritis in his father and mother; Congestive Heart Failure in his brother,  sister, and sister; Heart disease in his father, mother, and paternal grandfather; Hypertension in his father; Stroke in his brother.He reports that he quit smoking about 69 years ago. His smoking use included cigarettes. He started smoking about 79 years ago. He has a 10 pack-year smoking history. He quit smokeless tobacco use about 59 years ago.  His smokeless tobacco use included chew. He reports that he does not drink alcohol and does not use drugs.    ROS Review of Systems  Constitutional:  Negative for fever.  Respiratory:  Negative for shortness of breath.   Cardiovascular:  Negative for chest pain.  Musculoskeletal:  Positive for arthralgias and back pain.  Skin:  Negative for rash.    Objective:  BP 136/71   Pulse 88   Temp (!) 96.7 F (35.9 C)   Ht 5' 10 (1.778 m)   Wt 210 lb 3.2 oz (95.3 kg)   SpO2 98%   BMI 30.16 kg/m   BP Readings from Last 3 Encounters:  05/07/23 136/71  04/04/23 114/70  03/07/23 114/73    Wt Readings from Last 3 Encounters:  05/07/23 210 lb 3.2 oz (95.3 kg)  04/04/23 231 lb 8 oz (105 kg)  03/07/23 234 lb 9.6 oz (106.4 kg)     Physical Exam Vitals reviewed.  Constitutional:      Appearance: He is  well-developed.  HENT:     Head: Normocephalic and atraumatic.     Right Ear: External ear normal.     Left Ear: External ear normal.     Mouth/Throat:     Pharynx: No oropharyngeal exudate or posterior oropharyngeal erythema.  Eyes:     Pupils: Pupils are equal, round, and reactive to light.  Cardiovascular:     Rate and Rhythm: Normal rate and regular rhythm.     Heart sounds: No murmur heard. Pulmonary:     Effort: No respiratory distress.     Breath sounds: Normal breath sounds.  Musculoskeletal:     Cervical back: Normal range of motion and neck supple.  Neurological:     Mental Status: He is alert and oriented to person, place, and time.       Assessment & Plan:   Jerry Fox was seen today for hypertension.  Diagnoses and  all orders for this visit:  Chronic low back pain without sciatica, unspecified back pain laterality -     CMP14+EGFR  Primary hypertension -     CMP14+EGFR  Hyperlipidemia, unspecified hyperlipidemia type -     CMP14+EGFR  Paroxysmal atrial fibrillation (HCC) -     CMP14+EGFR  Lumbar spondylosis  Other orders -     gabapentin  (NEURONTIN ) 300 MG capsule; Take 2 capsules (600 mg total) by mouth 2 (two) times daily.       I have discontinued Nancyann BROCKS. Vasques's lovastatin  and tiZANidine . I have also changed his gabapentin . Additionally, I am having him maintain his dorzolamide -timolol , latanoprost , potassium chloride , apixaban , acetaminophen , triamcinolone  cream, albuterol , loratadine , Cholecalciferol , traZODone , furosemide , digoxin , and lisinopril .  Allergies as of 05/07/2023       Reactions   Diltiazem  Rash        Medication List        Accurate as of May 07, 2023  9:24 PM. If you have any questions, ask your nurse or doctor.          STOP taking these medications    lovastatin  40 MG tablet Commonly known as: MEVACOR  Stopped by: Yovanna Cogan   tiZANidine  2 MG tablet Commonly known as: ZANAFLEX  Stopped by: Boyce Keltner       TAKE these medications    acetaminophen  500 MG tablet Commonly known as: TYLENOL  Take 1,000 mg by mouth 3 (three) times daily.   apixaban  5 MG Tabs tablet Commonly known as: ELIQUIS  Take 1 tablet (5 mg total) by mouth 2 (two) times daily.   Cholecalciferol  50 MCG (2000 UT) Caps Take 1 capsule by mouth daily.   digoxin  0.125 MG tablet Commonly known as: LANOXIN  Take 1 tablet (125 mcg total) by mouth daily.   dorzolamide -timolol  2-0.5 % ophthalmic solution Commonly known as: COSOPT  Place 1 drop into both eyes 2 (two) times daily.   furosemide  20 MG tablet Commonly known as: LASIX  Take 1 tablet (20 mg total) by mouth daily.   gabapentin  300 MG capsule Commonly known as: Neurontin  Take 2 capsules (600 mg total) by  mouth 2 (two) times daily. What changed:  how much to take when to take this Changed by: Homer Miller   latanoprost  0.005 % ophthalmic solution Commonly known as: XALATAN  Place 1 drop into both eyes at bedtime.   lisinopril  10 MG tablet Commonly known as: ZESTRIL  Take 1 tablet (10 mg total) by mouth daily.   loratadine  10 MG tablet Commonly known as: CLARITIN  Take 1 tablet (10 mg total) by mouth daily for 7 days, then as needed  thereafter.   potassium chloride  10 MEQ tablet Commonly known as: KLOR-CON  Take 1 tablet (10 mEq total) by mouth daily.   traZODone  50 MG tablet Commonly known as: DESYREL  Take 1 tablet (50 mg total) by mouth at bedtime.   triamcinolone  cream 0.1 % Commonly known as: KENALOG  Apply 1 Application topically 2 (two) times daily. to affected area(s)   Ventolin  HFA 108 (90 Base) MCG/ACT inhaler Generic drug: albuterol  Inhale 2 puffs into the lungs every 4 (four) hours as needed for wheezing or shortness of breath.         Follow-up: Return in about 3 months (around 08/05/2023), or if symptoms worsen or fail to improve.  Butler Der, M.D.

## 2023-05-08 LAB — CMP14+EGFR
ALT: 13 [IU]/L (ref 0–44)
AST: 16 [IU]/L (ref 0–40)
Albumin: 4.1 g/dL (ref 3.7–4.7)
Alkaline Phosphatase: 62 [IU]/L (ref 44–121)
BUN/Creatinine Ratio: 21 (ref 10–24)
BUN: 17 mg/dL (ref 8–27)
Bilirubin Total: 0.5 mg/dL (ref 0.0–1.2)
CO2: 29 mmol/L (ref 20–29)
Calcium: 9.7 mg/dL (ref 8.6–10.2)
Chloride: 101 mmol/L (ref 96–106)
Creatinine, Ser: 0.81 mg/dL (ref 0.76–1.27)
Globulin, Total: 2.5 g/dL (ref 1.5–4.5)
Glucose: 130 mg/dL — ABNORMAL HIGH (ref 70–99)
Potassium: 4.6 mmol/L (ref 3.5–5.2)
Sodium: 142 mmol/L (ref 134–144)
Total Protein: 6.6 g/dL (ref 6.0–8.5)
eGFR: 85 mL/min/{1.73_m2} (ref >59)

## 2023-05-13 NOTE — Progress Notes (Signed)
Hello Jerry Fox,  Your lab result is normal and/or stable.Some minor variations that are not significant are commonly marked abnormal, but do not represent any medical problem for you.  Best regards, Maryah Marinaro, M.D.

## 2023-05-14 ENCOUNTER — Ambulatory Visit: Payer: Medicare PPO | Admitting: Family Medicine

## 2023-05-14 ENCOUNTER — Ambulatory Visit: Payer: Medicare PPO | Admitting: Cardiovascular Disease

## 2023-05-20 ENCOUNTER — Ambulatory Visit: Payer: Medicare PPO | Attending: Cardiovascular Disease | Admitting: Cardiovascular Disease

## 2023-05-20 ENCOUNTER — Ambulatory Visit: Payer: Medicare PPO | Admitting: Cardiovascular Disease

## 2023-05-20 ENCOUNTER — Encounter: Payer: Self-pay | Admitting: Cardiovascular Disease

## 2023-05-20 VITALS — BP 131/70 | HR 86 | Ht 69.0 in | Wt 217.4 lb

## 2023-05-20 DIAGNOSIS — D6869 Other thrombophilia: Secondary | ICD-10-CM

## 2023-05-20 DIAGNOSIS — I10 Essential (primary) hypertension: Secondary | ICD-10-CM

## 2023-05-20 DIAGNOSIS — R7303 Prediabetes: Secondary | ICD-10-CM | POA: Diagnosis not present

## 2023-05-20 DIAGNOSIS — F03A0 Unspecified dementia, mild, without behavioral disturbance, psychotic disturbance, mood disturbance, and anxiety: Secondary | ICD-10-CM | POA: Diagnosis not present

## 2023-05-20 DIAGNOSIS — I714 Abdominal aortic aneurysm, without rupture, unspecified: Secondary | ICD-10-CM

## 2023-05-20 DIAGNOSIS — Z86711 Personal history of pulmonary embolism: Secondary | ICD-10-CM

## 2023-05-20 DIAGNOSIS — I4821 Permanent atrial fibrillation: Secondary | ICD-10-CM | POA: Diagnosis not present

## 2023-05-20 DIAGNOSIS — I7143 Infrarenal abdominal aortic aneurysm, without rupture: Secondary | ICD-10-CM | POA: Diagnosis not present

## 2023-05-20 MED ORDER — METOPROLOL SUCCINATE ER 50 MG PO TB24: 50.0000 mg | ORAL_TABLET | Freq: Every day | ORAL | 3 refills | Status: DC

## 2023-05-20 NOTE — Patient Instructions (Addendum)
Medication Instructions:  STOP DIGOXIN START Metoprolol Succinate 50 mg *If you need a refill on your cardiac medications before your next appointment, please call your pharmacy*  CALL IF PULSE IS:  LESS THAN 50 OR GREATER THAN 110  CALL IF THE TOP NUMBER OF  BP IS:  LESS THAN 100 OR GREATER THAN 180  (Check BP 3 times in a row and take average of the three= true BP)  Testing/Procedures: Your physician has requested that you have an abdominal aorta duplex. During this test, an ultrasound is used to evaluate the aorta. Allow 30 minutes for this exam. Do not eat after midnight the day before and avoid carbonated beverages.  Please note: We ask at that you not bring children with you during ultrasound (echo/ vascular) testing. Due to room size and safety concerns, children are not allowed in the ultrasound rooms during exams. Our front office staff cannot provide observation of children in our lobby area while testing is being conducted. An adult accompanying a patient to their appointment will only be allowed in the ultrasound room at the discretion of the ultrasound technician under special circumstances. We apologize for any inconvenience.    Follow-Up: At [facility], you and your health needs are our priority.  As part of our continuing mission to provide you with exceptional heart care, we have created designated Provider Care Teams.  These Care Teams include your primary Cardiologist (physician) and Advanced Practice Providers (APPs -  Physician Assistants and Nurse Practitioners) who all work together to provide you with the care you need, when you need it.  We recommend signing up for the patient portal called "MyChart".  Sign up information is provided on this After Visit Summary.  MyChart is used to connect with patients for Virtual Visits (Telemedicine).  Patients are able to view lab/test results, encounter notes, upcoming appointments, etc.  Non-urgent messages can be sent  to your provider as well.   To learn more about what you can do with MyChart, go to [email or web address].    Your next appointment:   APP in 6 weeks  Dr Royann Shivers 1 year

## 2023-05-22 ENCOUNTER — Telehealth: Payer: Self-pay | Admitting: Family Medicine

## 2023-05-22 ENCOUNTER — Encounter: Payer: Self-pay | Admitting: Cardiovascular Disease

## 2023-05-22 NOTE — Telephone Encounter (Signed)
Has anyone in clinical tried calling patient? I dont see any notes either. If not, then maybe it was the automated system to confirm upcoming appt?   Copied from CRM [id number]. Topic: Clinical - Medical Advice >> May 22, 2023 12:30 PM Shelah Lewandowsky wrote: Reason for CRM: returning call from office, no message was left, please call patient [phone]

## 2023-05-22 NOTE — Progress Notes (Signed)
Cardiology Office Note:    Date:  05/22/2023   ID:  [name], DOB 1933/07/07, MRN [id number]  PCP:  Mechele Claude, MD   [facility] Providers Cardiologist:  None     Referring MD: Mechele Claude, MD   Chief Complaint  Patient presents with   Atrial Fibrillation    History of Present Illness:    Jerry Fox is a 89 y.o. male veteran with a hx of permanent atrial fibrillation, AAA, hypertension, remote history of saddle pulmonary embolism due to left lower extremity DVT in 2014, hypercholesterolemia, dementia, who is here for his first cardiology office in our practice since 2014 when he saw Dr. Graciela Husbands.  History and review of systems is primarily obtained from his wife, since Jerry Fox has significant cognitive and memory issues.  He receives much of his care in the [place] system.  He is in a wheelchair today, but gets around the house with a walker.  He does not have problems with chest pain or shortness of breath.  He denies dizziness or palpitations and has not experienced any falls.  He has not had any serious bleeding problems on Eliquis anticoagulation.  He denies abdominal discomfort.  He has not had any recent focal neurological complaints.  He has lost about 20 pounds in the last year.  According to his wife his blood pressure is frequently high, never lower than 130/70.  A week ago in the clinic his blood pressure is 136/71.  His blood pressure was elevated today on arrival at 162/70, but recheck was 131/70.  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    Past Medical History:  Diagnosis Date   AAA (abdominal aortic aneurysm) (HCC) 2/14   3.8 cm 2/14   Arthritis    Atrial fibrillation (HCC) 2/14   acute pulmonary embolus   Colon polyps    Hyperlipidemia    Hypertension    Murmur, cardiac     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   LLE DVT    Past Surgical History:  Procedure Laterality Date   TONSILLECTOMY      Current Medications: Current Meds  Medication Sig   acetaminophen (TYLENOL) 500 MG tablet Take 1,000 mg by mouth 3 (three) times daily.   apixaban (ELIQUIS) 5 MG TABS tablet Take 1 tablet (5 mg total) by mouth 2 (two) times daily.   Cholecalciferol 50 MCG (2000 UT) CAPS Take 1 capsule by mouth daily.   dorzolamide-timolol (COSOPT) 22.3-6.8 MG/ML ophthalmic solution Place 1 drop into both eyes 2 (two) times daily.   furosemide (LASIX) 20 MG tablet Take 1 tablet (20 mg total) by mouth daily.   gabapentin (NEURONTIN) 300 MG capsule Take 2 capsules (600 mg total) by mouth 2 (two) times daily.   latanoprost (XALATAN) 0.005 % ophthalmic solution Place 1 drop into both eyes at bedtime.   lisinopril (ZESTRIL) 10 MG tablet Take 1 tablet (10 mg total) by mouth daily.   metoprolol succinate (TOPROL-XL) 50 MG 24 hr tablet Take 1 tablet (50 mg total) by mouth daily. Take with or immediately following a meal.   potassium chloride (KLOR-CON) 10 MEQ tablet Take 1 tablet (10 mEq total) by mouth daily.   traZODone (DESYREL) 50 MG tablet Take 1 tablet (50 mg total) by mouth at bedtime.   triamcinolone cream (KENALOG) 0.1 %  Apply 1 Application topically 2 (two) times daily. to affected area(s)   [DISCONTINUED] digoxin (LANOXIN) 0.125 MG tablet Take 1 tablet (125 mcg total) by mouth daily.     Allergies:   Diltiazem   Social History   Socioeconomic History   Marital status: Married    Spouse name: Not on file   Number of children: 3   Years of education: Not on file   Highest education level: Not on file  Occupational History   Not on file  Tobacco Use   Smoking status: Former    Current packs/day: 0.00    Average packs/day: 1 pack/day for 10.0 years (10.0 ttl pk-yrs)    Types: Cigarettes    Start date: 02/01/1944    Quit date: 01/31/1954    Years since quitting: 69.3    Smokeless tobacco: Former    Types: Chew    Quit date: 02/01/1964  Vaping Use   Vaping status: Never Used  Substance and Sexual Activity   Alcohol use: No   Drug use: Never   Sexual activity: Not on file  Other Topics Concern   Not on file  Social History Narrative   Married & retired   3 sons + 5 grandchildren   Social Drivers of Corporate investment banker Strain: Low Risk  (07/04/2022)   Overall Financial Resource Strain (CARDIA)    Difficulty of Paying Living Expenses: Not hard at all  Food Insecurity: No Food Insecurity (10/25/2022)   Hunger Vital Sign    Worried About Running Out of Food in the Last Year: Never true    Ran Out of Food in the Last Year: Never true  Transportation Needs: No Transportation Needs (10/25/2022)   PRAPARE - Administrator, Civil Service (Medical): No    Lack of Transportation (Non-Medical): No  Physical Activity: Inactive (07/04/2022)   Exercise Vital Sign    Days of Exercise per Week: 0 days    Minutes of Exercise per Session: 0 min  Stress: No Stress Concern Present (07/04/2022)   [employer] of Occupational Health - Occupational Stress Questionnaire    Feeling of Stress : Not at all  Social Connections: Moderately Integrated (07/04/2022)   Social Connection and Isolation Panel [NHANES]    Frequency of Communication with Friends and Family: More than three times a week    Frequency of Social Gatherings with Friends and Family: More than three times a week    Attends Religious Services: More than 4 times per year    Active Member of [employer] or Organizations: No    Attends Engineer, structural: Never    Marital Status: Married     Family History: The patient's family history includes Arthritis in his father and mother; Congestive Heart Failure in his brother, sister, and sister; Heart disease in his father, mother, and paternal grandfather; Hypertension in his father; Stroke in his brother.  ROS:   Please see the history  of present illness.     All other systems reviewed and are negative.  EKGs/Labs/Other Studies Reviewed:    The following studies were reviewed today:  EKG Interpretation Date/Time:  Monday May 20 2023 14:48:56 EST Ventricular Rate:  86 PR Interval:    QRS Duration:  102 QT Interval:  362 QTC Calculation: 433 R Axis:   18  Text Interpretation: Atrial fibrillation When compared with ECG of 06-Mar-2023 22:33, No significant change since last tracing Confirmed by Kharee Lesesne ([id number]) on 05/20/2023 2:49:48 PM  Recent Labs: 02/07/2023: Magnesium 2.1 02/11/2023: BNP 251.4 03/06/2023: Hemoglobin 11.9; Platelets 238 05/07/2023: ALT 13; BUN 17; Creatinine, Ser 0.81; Potassium 4.6; Sodium 142  Recent Lipid Panel    Component Value Date/Time   CHOL 104 11/26/2022 1238   TRIG 107 11/26/2022 1238   HDL 40 11/26/2022 1238   CHOLHDL 2.6 11/26/2022 1238   CHOLHDL 4 08/14/2017 1035   VLDL 21.6 08/14/2017 1035   LDLCALC 44 11/26/2022 1238     Risk Assessment/Calculations:    CHA2DS2-VASc Score = 4   This indicates a 4.8% annual risk of stroke. The patient's score is based upon: CHF History: 0 HTN History: 1 Diabetes History: 0 Stroke History: 0 Vascular Disease History: 1 Age Score: 2 Gender Score: 0           Physical Exam:    VS:  BP 131/70   Pulse 86   Ht 5\' 9"  (1.753 m)   Wt 217 lb 6.4 oz (98.6 kg)   SpO2 97%   BMI 32.10 kg/m     Wt Readings from Last 3 Encounters:  05/20/23 217 lb 6.4 oz (98.6 kg)  05/07/23 210 lb 3.2 oz (95.3 kg)  04/04/23 231 lb 8 oz (105 kg)     GEN: Mildly obese, well nourished, well developed in no acute distress HEENT: Normal NECK: No JVD; No carotid bruits LYMPHATICS: No lymphadenopathy CARDIAC: Irregular, no murmurs, rubs, gallops RESPIRATORY:  Clear to auscultation without rales, wheezing or rhonchi  ABDOMEN: Soft, non-tender, non-distended MUSCULOSKELETAL: Trace bilateral ankle edema; No deformity  SKIN: Warm and  dry NEUROLOGIC:  Alert, mildly disoriented with clear memory issues PSYCHIATRIC:  Normal affect   ASSESSMENT:    1. Permanent atrial fibrillation (HCC)   2. Primary hypertension   3. Infrarenal abdominal aortic aneurysm (AAA) without rupture (HCC)   4. Acquired thrombophilia (HCC)   5. Prediabetes   6. History of pulmonary embolism   7. Mild dementia without behavioral disturbance, psychotic disturbance, mood disturbance, or anxiety, unspecified dementia type (HCC)    PLAN:    In order of problems listed above:  AFib: Appropriately anticoagulated. Rate control is adequate with digoxin, but there are many reasons why a beta blocker would be a better choice (lower risk of toxicity, better BP control and prevention of AAA progression). Will start metoprolol succinate 50 mg daily and stop the digoxin. Anticoagulation: Well-tolerated without any bleeding complications. AAA: Asymptomatic but relatively large infrarenal aneurysm.  Need to remeasure it.  Will schedule for ultrasound and it is probably time to refer him to vascular surgery since if it reaches 5.5 cm we should consider EVAR. HTN: Try to keep his blood pressure consistently less than 130/80 due to his large AAA.  Adding beta-blocker today. PreDM: Very mild elevation in average glycemic level and he has been losing weight so hopefully these glucose numbers will continue to improve.  Avoid sugary and starchy foods. HLP: He is diagnosed with hypercholesterolemia and his LDL cholesterol checked in July was quite low at 44.  I suspect he is on a statin, but this is not on his current list of medications.  His wife will check at home to see if he is taking one.  Years ago, his medication list did include lovastatin. Remote history of saddle pulmonary embolism: He will remain on lifelong anticoagulation for atrial fibrillation anyway.  No recurrence in the last 10 years. Dementia: Take this in consideration when planning invasive or  aggressive therapies.  Medication Adjustments/Labs and Tests Ordered: Current medicines are reviewed at length with the patient today.  Concerns regarding medicines are outlined above.  Orders Placed This Encounter  Procedures   EKG 12-Lead   VAS [place] AAA DUPLEX   Meds ordered this encounter  Medications   metoprolol succinate (TOPROL-XL) 50 MG 24 hr tablet    Sig: Take 1 tablet (50 mg total) by mouth daily. Take with or immediately following a meal.    Dispense:  90 tablet    Refill:  3    Patient Instructions  Medication Instructions:  STOP DIGOXIN START Metoprolol Succinate 50 mg *If you need a refill on your cardiac medications before your next appointment, please call your pharmacy*  CALL IF PULSE IS:  LESS THAN 50 OR GREATER THAN 110  CALL IF THE TOP NUMBER OF  BP IS:  LESS THAN 100 OR GREATER THAN 180  (Check BP 3 times in a row and take average of the three= true BP)  Testing/Procedures: Your physician has requested that you have an abdominal aorta duplex. During this test, an ultrasound is used to evaluate the aorta. Allow 30 minutes for this exam. Do not eat after midnight the day before and avoid carbonated beverages.  Please note: We ask at that you not bring children with you during ultrasound (echo/ vascular) testing. Due to room size and safety concerns, children are not allowed in the ultrasound rooms during exams. Our front office staff cannot provide observation of children in our lobby area while testing is being conducted. An adult accompanying a patient to their appointment will only be allowed in the ultrasound room at the discretion of the ultrasound technician under special circumstances. We apologize for any inconvenience.    Follow-Up: At [facility], you and your health needs are our priority.  As part of our continuing mission to provide you with exceptional heart care, we have created designated Provider Care Teams.  These Care  Teams include your primary Cardiologist (physician) and Advanced Practice Providers (APPs -  Physician Assistants and Nurse Practitioners) who all work together to provide you with the care you need, when you need it.  We recommend signing up for the patient portal called "MyChart".  Sign up information is provided on this After Visit Summary.  MyChart is used to connect with patients for Virtual Visits (Telemedicine).  Patients are able to view lab/test results, encounter notes, upcoming appointments, etc.  Non-urgent messages can be sent to your provider as well.   To learn more about what you can do with MyChart, go to [email or web address].    Your next appointment:   APP in 6 weeks  Dr Royann Shivers 1 year          Signed, Thurmon Fair, MD  05/22/2023 10:02 AM    [facility]

## 2023-05-22 NOTE — Telephone Encounter (Signed)
Per chart - no one has tried to contact patient

## 2023-05-30 ENCOUNTER — Ambulatory Visit: Payer: Medicare PPO | Admitting: Family Medicine

## 2023-06-12 ENCOUNTER — Ambulatory Visit: Payer: Medicare PPO | Admitting: Family Medicine

## 2023-06-13 ENCOUNTER — Ambulatory Visit (HOSPITAL_COMMUNITY): Admission: RE | Admit: 2023-06-13 | Payer: Medicare PPO | Source: Ambulatory Visit

## 2023-06-17 ENCOUNTER — Ambulatory Visit (HOSPITAL_COMMUNITY)
Admission: RE | Admit: 2023-06-17 | Discharge: 2023-06-17 | Disposition: A | Discharge status: Home / Self Care | Payer: Medicare PPO | Source: Ambulatory Visit | Attending: Cardiovascular Disease | Admitting: Cardiovascular Disease

## 2023-06-17 ENCOUNTER — Other Ambulatory Visit (HOSPITAL_COMMUNITY): Payer: Self-pay | Admitting: Cardiovascular Disease

## 2023-06-17 DIAGNOSIS — I714 Abdominal aortic aneurysm, without rupture, unspecified: Secondary | ICD-10-CM

## 2023-06-17 DIAGNOSIS — I7143 Infrarenal abdominal aortic aneurysm, without rupture: Secondary | ICD-10-CM | POA: Diagnosis not present

## 2023-06-19 ENCOUNTER — Other Ambulatory Visit: Payer: Self-pay | Admitting: *Deleted

## 2023-06-19 DIAGNOSIS — I7143 Infrarenal abdominal aortic aneurysm, without rupture: Secondary | ICD-10-CM

## 2023-07-01 ENCOUNTER — Ambulatory Visit: Payer: Medicare PPO | Admitting: Adult Health

## 2023-07-05 ENCOUNTER — Ambulatory Visit: Payer: Medicare PPO

## 2023-07-05 VITALS — Ht 69.0 in | Wt 217.0 lb

## 2023-07-05 DIAGNOSIS — Z0000 Encounter for general adult medical examination without abnormal findings: Secondary | ICD-10-CM

## 2023-07-05 NOTE — Patient Instructions (Signed)
 Mr. Becraft , Thank you for taking time to come for your Medicare Wellness Visit. I appreciate your ongoing commitment to your health goals. Please review the following plan we discussed and let me know if I can assist you in the future.   Referrals/Orders/Follow-Ups/Clinician Recommendations: Aim for 30 minutes of exercise or brisk walking, 6-8 glasses of water, and 5 servings of fruits and vegetables each day.  This is a list of the screening recommended for you and due dates:  Health Maintenance  Topic Date Due   COVID-19 Vaccine (2 - Moderna risk series) 04/19/2024*   Medicare Annual Wellness Visit  07/04/2024   DTaP/Tdap/Td vaccine (2 - Td or Tdap) 11/24/2024   Pneumonia Vaccine  Completed   Flu Shot  Completed   Zoster (Shingles) Vaccine  Completed   HPV Vaccine  Aged Out  *Topic was postponed. The date shown is not the original due date.    Advanced directives: (ACP Link)Information on Advanced Care Planning can be found at [facility] Advance Health Care Directives Advance Health Care Directives. [email or web address]   Next Medicare Annual Wellness Visit scheduled for next year: Yes

## 2023-07-05 NOTE — Progress Notes (Signed)
 Subjective:   Jerry Fox is a 89 y.o. who presents for a Medicare Wellness preventive visit.  Visit Complete: Virtual I connected with  Lester Greeley on 07/05/23 by a audio enabled telemedicine application and verified that I am speaking with the correct person using two identifiers.  Patient Location: Home  Provider Location: Home Office  I discussed the limitations of evaluation and management by telemedicine. The patient expressed understanding and agreed to proceed.  Vital Signs: Because this visit was a virtual/telehealth visit, some criteria may be missing or patient reported. Any vitals not documented were not able to be obtained and vitals that have been documented are patient reported.  VideoDeclined- This patient declined Librarian, academic. Therefore the visit was completed with audio only.  AWV Questionnaire: No: Patient Medicare AWV questionnaire was not completed prior to this visit.  Cardiac Risk Factors include: advanced age (>41men, >57 women);male gender;hypertension;dyslipidemia     Objective:    Today's Vitals   07/05/23 1250  Weight: 217 lb (98.4 kg)  Height: 5\' 9"  (1.753 m)   Body mass index is 32.05 kg/m.     07/05/2023    2:01 PM 03/06/2023   10:50 PM 03/06/2023    9:32 PM 02/07/2023    1:29 PM 02/07/2023    1:06 PM 10/25/2022    2:59 PM 07/04/2022    1:46 PM  Advanced Directives  Does Patient Have a Medical Advance Directive? No No No No No No Yes  Type of Tax inspector;Living will  Copy of Healthcare Power of Attorney in Chart?       No - copy requested  Would patient like information on creating a medical advance directive? Yes (MAU/Ambulatory/Procedural Areas - Information given) No - Patient declined No - Patient declined  No - Patient declined No - Patient declined     Current Medications (verified) Outpatient Encounter Medications as of 07/05/2023  Medication Sig    acetaminophen (TYLENOL) 500 MG tablet Take 1,000 mg by mouth 3 (three) times daily.   apixaban (ELIQUIS) 5 MG TABS tablet Take 1 tablet (5 mg total) by mouth 2 (two) times daily.   Cholecalciferol 50 MCG (2000 UT) CAPS Take 1 capsule by mouth daily.   dorzolamide-timolol (COSOPT) 22.3-6.8 MG/ML ophthalmic solution Place 1 drop into both eyes 2 (two) times daily.   furosemide (LASIX) 20 MG tablet Take 1 tablet (20 mg total) by mouth daily.   gabapentin (NEURONTIN) 300 MG capsule Take 2 capsules (600 mg total) by mouth 2 (two) times daily.   latanoprost (XALATAN) 0.005 % ophthalmic solution Place 1 drop into both eyes at bedtime.   lisinopril (ZESTRIL) 10 MG tablet Take 1 tablet (10 mg total) by mouth daily.   metoprolol succinate (TOPROL-XL) 50 MG 24 hr tablet Take 1 tablet (50 mg total) by mouth daily. Take with or immediately following a meal.   potassium chloride (KLOR-CON) 10 MEQ tablet Take 1 tablet (10 mEq total) by mouth daily.   traZODone (DESYREL) 50 MG tablet Take 1 tablet (50 mg total) by mouth at bedtime.   triamcinolone cream (KENALOG) 0.1 % Apply 1 Application topically 2 (two) times daily. to affected area(s)   albuterol (VENTOLIN HFA) 108 (90 Base) MCG/ACT inhaler Inhale 2 puffs into the lungs every 4 (four) hours as needed for wheezing or shortness of breath. (Patient not taking: Reported on 05/20/2023)   loratadine (CLARITIN) 10 MG tablet Take 1 tablet (  10 mg total) by mouth daily for 7 days, then as needed thereafter. (Patient not taking: Reported on 05/20/2023)   No facility-administered encounter medications on file as of 07/05/2023.    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   LLE DVT   Past  Surgical History:  Procedure Laterality Date   TONSILLECTOMY     Family History  Problem Relation Age of Onset   Arthritis Mother    Heart disease Mother    Arthritis Father    Heart disease Father    Hypertension Father    Congestive Heart Failure Sister    Congestive Heart Failure Sister    Stroke Brother    Congestive Heart Failure Brother    Heart disease Paternal Grandfather    Social History   Socioeconomic History   Marital status: Married    Spouse name: Not on file   Number of children: 3   Years of education: Not on file   Highest education level: Not on file  Occupational History   Not on file  Tobacco Use   Smoking status: Former    Current packs/day: 0.00    Average packs/day: 1 pack/day for 10.0 years (10.0 ttl pk-yrs)    Types: Cigarettes    Start date: 02/01/1944    Quit date: 01/31/1954    Years since quitting: 69.4   Smokeless tobacco: Former    Types: Chew    Quit date: 02/01/1964  Vaping Use   Vaping status: Never Used  Substance and Sexual Activity   Alcohol use: No   Drug use: Never   Sexual activity: Not on file  Other Topics Concern   Not on file  Social History Narrative   Married & retired   3 sons + 5 grandchildren   Social Drivers of Corporate investment banker Strain: Low Risk  (07/05/2023)   Overall Financial Resource Strain (CARDIA)    Difficulty of Paying Living Expenses: Not hard at all  Food Insecurity: No Food Insecurity (07/05/2023)   Hunger Vital Sign    Worried About Running Out of Food in the Last Year: Never true    Ran Out of Food in the Last Year: Never true  Transportation Needs: No Transportation Needs (07/05/2023)   PRAPARE - Administrator, Civil Service (Medical): No    Lack of Transportation (Non-Medical): No  Physical Activity: Inactive (07/05/2023)   Exercise Vital Sign    Days of Exercise per Week: 0 days    Minutes of Exercise per Session: 0 min  Stress: No Stress Concern Present (07/04/2022)    [employer] of Occupational Health - Occupational Stress Questionnaire    Feeling of Stress : Not at all  Social Connections: Moderately Integrated (07/05/2023)   Social Connection and Isolation Panel [NHANES]    Frequency of Communication with Friends and Family: More than three times a week    Frequency of Social Gatherings with Friends and Family: Three times a week    Attends Religious Services: More than 4 times per year    Active Member of Clubs or Organizations: No    Attends Banker Meetings: Never  Marital Status: Married    Tobacco Counseling Counseling given: Not Answered    Clinical Intake:  Pre-visit preparation completed: Yes  Pain : No/denies pain     Diabetes: No  How often do you need to have someone help you when you read instructions, pamphlets, or other written materials from your doctor or pharmacy?: 1 - Never  Interpreter Needed?: No  Information entered by :: Kandis Fantasia LPN   Activities of Daily Living     07/05/2023    1:38 PM 10/25/2022   11:07 PM  In your present state of health, do you have any difficulty performing the following activities:  Hearing? 0 1  Vision? 0 0  Difficulty concentrating or making decisions? 0 0  Walking or climbing stairs? 0 1  Dressing or bathing? 0 0  Doing errands, shopping? 0 1  Preparing Food and eating ? N   Using the Toilet? N   In the past six months, have you accidently leaked urine? N   Do you have problems with loss of bowel control? N   Managing your Medications? N   Managing your Finances? N   Housekeeping or managing your Housekeeping? N     Patient Care Team: Mechele Claude, MD as PCP - General (Family Medicine)  Indicate any recent Medical Services you may have received from other than [facility] providers in the past year (date may be approximate).     Assessment:   This is a routine wellness examination for Kagen.  Hearing/Vision screen Hearing Screening - Comments::  Denies hearing difficulties   Vision Screening - Comments:: No vision problems; will schedule routine eye exam soon      Goals Addressed   None    Depression Screen     07/05/2023    1:40 PM 05/07/2023    3:31 PM 03/07/2023    3:11 PM 03/07/2023    2:59 PM 02/18/2023    3:53 PM 02/11/2023   10:10 AM 11/26/2022   11:01 AM  PHQ 2/9 Scores  PHQ - 2 Score 0 0 0 0 0 0 0  PHQ- 9 Score 0 0       Exception Documentation Patient refusal          Fall Risk     07/05/2023    2:06 PM 05/07/2023    3:31 PM 03/07/2023    3:09 PM 03/07/2023    2:59 PM 02/18/2023    3:53 PM  Fall Risk   Falls in the past year? 0 0 0 0 0  Number falls in past yr: 0  0    Injury with Fall? 0      Risk for fall due to : No Fall Risks No Fall Risks History of fall(s)    Follow up Falls prevention discussed;Education provided;Falls evaluation completed Falls evaluation completed Falls evaluation completed      MEDICARE RISK AT HOME:  Medicare Risk at Home Any stairs in or around the home?: No If so, are there any without handrails?: No Home free of loose throw rugs in walkways, pet beds, electrical cords, etc?: Yes Adequate lighting in your home to reduce risk of falls?: Yes Life alert?: No Use of a cane, walker or w/c?: Yes Grab bars in the bathroom?: Yes Shower chair or bench in shower?: Yes Elevated toilet seat or a handicapped toilet?: Yes  TIMED UP AND GO:  Was the test performed?  No  Cognitive Function: 6CIT completed        07/05/2023  2:06 PM 07/04/2022    1:46 PM 06/27/2021    2:09 PM 04/14/2019    2:09 PM  6CIT Screen  What Year? 0 points 0 points 0 points 0 points  What month? 0 points 0 points 0 points 0 points  What time? 0 points 0 points 0 points 0 points  Count back from 20 0 points 0 points 0 points 0 points  Months in reverse 0 points 0 points 0 points 2 points  Repeat phrase 0 points 0 points 0 points 2 points  Total Score 0 points 0 points 0 points 4 points     Immunizations Immunization History  Administered Date(s) Administered   Fluad Quad(high Dose 65+) 01/16/2019   Fluad Trivalent(High Dose 65+) 02/11/2023, 03/07/2023   Influenza Split 02/01/2011, 04/09/2012   Influenza, High Dose Seasonal PF 02/20/2013, 03/03/2014, 02/28/2016, 04/01/2017   Moderna Sars-Covid-2 Vaccination 06/20/2020   Pneumococcal Conjugate-13 11/25/2014   Pneumococcal Polysaccharide-23 11/28/2015   Tdap 11/25/2014   Zoster Recombinant(Shingrix) 10/07/2017, 12/14/2017    Screening Tests Health Maintenance  Topic Date Due   COVID-19 Vaccine (2 - Moderna risk series) 04/19/2024 (Originally 07/18/2020)   Medicare Annual Wellness (AWV)  07/04/2024   DTaP/Tdap/Td (2 - Td or Tdap) 11/24/2024   Pneumonia Vaccine 46+ Years old  Completed   INFLUENZA VACCINE  Completed   Zoster Vaccines- Shingrix  Completed   HPV VACCINES  Aged Out    Health Maintenance  There are no preventive care reminders to display for this patient.  Additional Screening:  Vision Screening: Recommended annual ophthalmology exams for early detection of glaucoma and other disorders of the eye.  Dental Screening: Recommended annual dental exams for proper oral hygiene  Community Resource Referral / Chronic Care Management: CRR required this visit?  No   CCM required this visit?  No     Plan:     I have personally reviewed and noted the following in the patient's chart:   Medical and social history Use of alcohol, tobacco or illicit drugs  Current medications and supplements including opioid prescriptions. Patient is not currently taking opioid prescriptions. Functional ability and status Nutritional status Physical activity Advanced directives List of other physicians Hospitalizations, surgeries, and ER visits in previous 12 months Vitals Screenings to include cognitive, depression, and falls Referrals and appointments  In addition, I have reviewed and discussed with patient  certain preventive protocols, quality metrics, and best practice recommendations. A written personalized care plan for preventive services as well as general preventive health recommendations were provided to patient.     Kandis Fantasia [place], [place]   05/05/1094   After Visit Summary: (Mail) Due to this being a telephonic visit, the after visit summary with patients personalized plan was offered to patient via mail   Notes: Nothing significant to report at this time.

## 2023-07-17 ENCOUNTER — Encounter: Payer: Self-pay | Admitting: *Deleted

## 2023-07-26 ENCOUNTER — Ambulatory Visit: Payer: Medicare PPO | Attending: Cardiovascular Disease | Admitting: Cardiovascular Disease

## 2023-08-02 ENCOUNTER — Other Ambulatory Visit (HOSPITAL_COMMUNITY): Payer: Self-pay | Admitting: Emergency Medicine

## 2023-08-05 ENCOUNTER — Ambulatory Visit (INDEPENDENT_AMBULATORY_CARE_PROVIDER_SITE_OTHER): Payer: Medicare PPO | Admitting: Family Medicine

## 2023-08-05 ENCOUNTER — Encounter: Payer: Self-pay | Admitting: Family Medicine

## 2023-08-05 ENCOUNTER — Telehealth: Payer: Self-pay

## 2023-08-05 VITALS — BP 138/69 | HR 107 | Temp 97.5°F | Ht 69.0 in | Wt 242.0 lb

## 2023-08-05 DIAGNOSIS — G8929 Other chronic pain: Secondary | ICD-10-CM | POA: Diagnosis not present

## 2023-08-05 DIAGNOSIS — I480 Paroxysmal atrial fibrillation: Secondary | ICD-10-CM

## 2023-08-05 DIAGNOSIS — M545 Low back pain, unspecified: Secondary | ICD-10-CM

## 2023-08-05 DIAGNOSIS — I10 Essential (primary) hypertension: Secondary | ICD-10-CM

## 2023-08-05 DIAGNOSIS — R600 Localized edema: Secondary | ICD-10-CM

## 2023-08-05 DIAGNOSIS — M47816 Spondylosis without myelopathy or radiculopathy, lumbar region: Secondary | ICD-10-CM

## 2023-08-05 DIAGNOSIS — E785 Hyperlipidemia, unspecified: Secondary | ICD-10-CM | POA: Diagnosis not present

## 2023-08-05 MED ORDER — FUROSEMIDE 40 MG PO TABS: 40.0000 mg | ORAL_TABLET | Freq: Every day | ORAL | 3 refills | Status: DC

## 2023-08-05 MED ORDER — GABAPENTIN 300 MG PO CAPS: 600.0000 mg | ORAL_CAPSULE | Freq: Two times a day (BID) | ORAL | 1 refills | Status: DC

## 2023-08-05 MED ORDER — POTASSIUM CHLORIDE ER 10 MEQ PO TBCR: 10.0000 meq | EXTENDED_RELEASE_TABLET | Freq: Every day | ORAL | 3 refills | Status: DC

## 2023-08-05 MED ORDER — APIXABAN 5 MG PO TABS: 5.0000 mg | ORAL_TABLET | Freq: Two times a day (BID) | ORAL | 3 refills | Status: DC

## 2023-08-05 NOTE — Telephone Encounter (Signed)
 Copied from CRM [id number]. Topic: Clinical - Medical Advice >> Aug 05, 2023  4:33 PM Melissa C wrote: Reason for CRM: patient applied for a handicap assistance placard and physician completed paperwork, however physician did not note reason for needing placard (difficulty walking, need oxygen etc) since patient was with representative I tried calling [employer] and they transferred to nurse, I stayed on the line to ensure connection and other line hung up therefore I let representative know I would send a message. Please call [employer] and let them know patient's reason for needing placard. Phone number is [phone]. Thank you

## 2023-08-05 NOTE — Progress Notes (Signed)
 Subjective:  Patient ID: Jerry Fox, male    DOB: 11/07/1933  Age: 89 y.o. MRN: [id number]  CC: Medical Management of Chronic Issues (Pt has been sleeping a lot. ), Edema (Leg swelling for a couple weeks. ), and Shortness of Breath (Sounds like he is SOB. Pt denys.)   HPI Jerry Fox presents for edema of the legs. He denies pain. He is taking furosemide 20 mg a day, but not elevating legs. He takes eliquis for previous PE.    presents for  follow-up of hypertension. Patient has no history of headache chest pain or shortness of breath or recent cough. Patient also denies symptoms of TIA such as focal numbness or weakness. Patient denies side effects from medication. States taking it regularly.  Atrial fibrillation follow up. Pt. is treated with rate control and anticoagulation. Pt.  denies palpitations, rapid rate, chest pain, dyspnea and edema. There has been no bleeding from nose or gums. Pt. has not noticed blood with urine or stool.  Although there is routine bruising easily, it is not excessive.       08/05/2023    3:26 PM 07/05/2023    1:40 PM 05/07/2023    3:31 PM  Depression screen PHQ 2/9  Decreased Interest 0 0 0  Down, Depressed, Hopeless 0 0 0  PHQ - 2 Score 0 0 0  Altered sleeping 0 0 0  Tired, decreased energy 0 0 0  Change in appetite 0 0 0  Feeling bad or failure about yourself  0 0 0  Trouble concentrating 0 0 0  Moving slowly or fidgety/restless 0 0 0  Suicidal thoughts 0 0 0  PHQ-9 Score 0 0 0  Difficult doing work/chores Not difficult at all Not difficult at all Not difficult at all    History Jerry Fox has a past medical history of AAA (abdominal aortic aneurysm) (HCC) (05/31/2012), Arthritis, Atrial fibrillation (HCC) (05/31/2012), Colon polyps, Hyperlipidemia, Hypertension, Murmur, cardiac, New onset atrial fibrillation (HCC) (06/10/2012), and Saddle pulmonary embolus (HCC) (05/31/2012).   He has a past surgical history that includes Tonsillectomy.    His family history includes Arthritis in his father and mother; Congestive Heart Failure in his brother, sister, and sister; Heart disease in his father, mother, and paternal grandfather; Hypertension in his father; Stroke in his brother.He reports that he quit smoking about 69 years ago. His smoking use included cigarettes. He started smoking about 79 years ago. He has a 10 pack-year smoking history. He quit smokeless tobacco use about 59 years ago.  His smokeless tobacco use included chew. He reports that he does not drink alcohol and does not use drugs.    ROS Review of Systems  Constitutional: Negative.   HENT: Negative.    Eyes:  Negative for visual disturbance.  Respiratory:  Negative for cough and shortness of breath.   Cardiovascular:  Positive for leg swelling. Negative for chest pain.  Gastrointestinal:  Negative for abdominal pain, diarrhea, nausea and vomiting.  Genitourinary:  Negative for difficulty urinating.  Musculoskeletal:  Negative for arthralgias and myalgias.  Skin:  Negative for rash.  Neurological:  Negative for headaches.  Psychiatric/Behavioral:  Negative for sleep disturbance.     Objective:  BP 138/69   Pulse (!) 107   Temp (!) 97.5 F (36.4 C)   Ht 5\' 9"  (1.753 m)   Wt 242 lb (109.8 kg)   SpO2 97%   BMI 35.74 kg/m   BP Readings from Last 3 Encounters:  08/05/23 138/69  05/20/23 131/70  05/07/23 136/71    Wt Readings from Last 3 Encounters:  08/05/23 242 lb (109.8 kg)  07/05/23 217 lb (98.4 kg)  05/20/23 217 lb 6.4 oz (98.6 kg)     Physical Exam Vitals reviewed.  Constitutional:      Appearance: He is well-developed.  HENT:     Head: Normocephalic and atraumatic.     Right Ear: External ear normal.     Left Ear: External ear normal.     Mouth/Throat:     Pharynx: No oropharyngeal exudate or posterior oropharyngeal erythema.  Eyes:     Pupils: Pupils are equal, round, and reactive to light.  Cardiovascular:     Rate and Rhythm:  Normal rate. Rhythm irregular.     Heart sounds: No murmur heard. Pulmonary:     Effort: No respiratory distress.     Breath sounds: Normal breath sounds.  Musculoskeletal:     Cervical back: Normal range of motion and neck supple.     Right lower leg: Edema (3+ to knees) present.     Left lower leg: Edema (3+ to knee) present.  Neurological:     Mental Status: He is alert and oriented to person, place, and time.      Assessment & Plan:  Chronic low back pain without sciatica, unspecified back pain laterality  Primary hypertension -     CBC with Differential/Platelet -     CMP14+EGFR -     Brain natriuretic peptide  Hyperlipidemia, unspecified hyperlipidemia type  Lumbar spondylosis  Paroxysmal atrial fibrillation (HCC) -     CBC with Differential/Platelet -     CMP14+EGFR -     Brain natriuretic peptide  Edema of both lower legs -     CBC with Differential/Platelet -     CMP14+EGFR -     Brain natriuretic peptide  Other orders -     Gabapentin; Take 2 capsules (600 mg total) by mouth 2 (two) times daily.  Dispense: 360 capsule; Refill: 1 -     Apixaban; Take 1 tablet (5 mg total) by mouth 2 (two) times daily.  Dispense: 180 tablet; Refill: 3 -     Furosemide; Take 1 tablet (40 mg total) by mouth daily. For swelling  Dispense: 90 tablet; Refill: 3 -     Potassium Chloride ER; Take 1 tablet (10 mEq total) by mouth daily.  Dispense: 90 tablet; Refill: 3     Follow-up: Return in about 6 weeks (around 09/16/2023) for edema.  Mechele Claude, M.D.

## 2023-08-05 NOTE — Telephone Encounter (Signed)
 Clarification given to them. LS

## 2023-08-06 LAB — CMP14+EGFR
ALT: 10 IU/L (ref 0–44)
AST: 16 IU/L (ref 0–40)
Albumin: 3.7 g/dL (ref 3.7–4.7)
Alkaline Phosphatase: 97 IU/L (ref 44–121)
BUN/Creatinine Ratio: 14 (ref 10–24)
BUN: 14 mg/dL (ref 8–27)
Bilirubin Total: 0.6 mg/dL (ref 0.0–1.2)
CO2: 24 mmol/L (ref 20–29)
Calcium: 9.1 mg/dL (ref 8.6–10.2)
Chloride: 103 mmol/L (ref 96–106)
Creatinine, Ser: 1 mg/dL (ref 0.76–1.27)
Globulin, Total: 2.7 g/dL (ref 1.5–4.5)
Glucose: 73 mg/dL (ref 70–99)
Potassium: 4.1 mmol/L (ref 3.5–5.2)
Sodium: 141 mmol/L (ref 134–144)
Total Protein: 6.4 g/dL (ref 6.0–8.5)
eGFR: 73 mL/min/{1.73_m2} (ref >59)

## 2023-08-06 LAB — CBC WITH DIFFERENTIAL/PLATELET
Basophils Absolute: 0.1 10*3/uL (ref 0.0–0.2)
Basos: 1 %
EOS (ABSOLUTE): 0 10*3/uL (ref 0.0–0.4)
Eos: 1 %
Hematocrit: 39.5 % (ref 37.5–51.0)
Hemoglobin: 13.4 g/dL (ref 13.0–17.7)
Immature Grans (Abs): 0 10*3/uL (ref 0.0–0.1)
Immature Granulocytes: 0 %
Lymphocytes Absolute: 1.7 10*3/uL (ref 0.7–3.1)
Lymphs: 22 %
MCH: 33.3 pg — ABNORMAL HIGH (ref 26.6–33.0)
MCHC: 33.9 g/dL (ref 31.5–35.7)
MCV: 98 fL — ABNORMAL HIGH (ref 79–97)
Monocytes Absolute: 0.7 10*3/uL (ref 0.1–0.9)
Monocytes: 10 %
Neutrophils Absolute: 5.2 10*3/uL (ref 1.4–7.0)
Neutrophils: 66 %
Platelets: 223 10*3/uL (ref 150–450)
RBC: 4.03 x10E6/uL — ABNORMAL LOW (ref 4.14–5.80)
RDW: 11.9 % (ref 11.6–15.4)
WBC: 7.7 10*3/uL (ref 3.4–10.8)

## 2023-08-06 LAB — BRAIN NATRIURETIC PEPTIDE: BNP: 284.2 pg/mL — ABNORMAL HIGH (ref 0.0–100.0)

## 2023-08-06 NOTE — Progress Notes (Signed)
Hello Keeghan,  Your lab result is normal and/or stable.Some minor variations that are not significant are commonly marked abnormal, but do not represent any medical problem for you.  Best regards, Maryah Marinaro, M.D.

## 2023-08-07 ENCOUNTER — Other Ambulatory Visit: Payer: Self-pay

## 2023-08-07 MED ORDER — TRAZODONE HCL 50 MG PO TABS: 50.0000 mg | ORAL_TABLET | Freq: Every day | ORAL | 3 refills | Status: DC

## 2023-08-07 NOTE — Telephone Encounter (Signed)
 NA/VM full Furosemide was sent in on 08/05/23 Asking PCP about his Trazodone

## 2023-08-07 NOTE — Telephone Encounter (Signed)
 Summary: Swelling high   Copied From CRM [id number]. Reason for Triage: Swelling high because he is out of furosemide  Best contact: [phone]        Reports that patient is out of furosemide.  Was seen on Monday; no new symptoms.

## 2023-08-13 ENCOUNTER — Encounter: Payer: Self-pay | Admitting: Family Medicine

## 2023-08-13 ENCOUNTER — Ambulatory Visit (INDEPENDENT_AMBULATORY_CARE_PROVIDER_SITE_OTHER): Admitting: Family Medicine

## 2023-08-13 VITALS — BP 127/87 | HR 115 | Temp 97.7°F | Ht 69.0 in | Wt 240.0 lb

## 2023-08-13 DIAGNOSIS — R601 Generalized edema: Secondary | ICD-10-CM

## 2023-08-13 MED ORDER — POTASSIUM CHLORIDE ER 10 MEQ PO TBCR: 10.0000 meq | EXTENDED_RELEASE_TABLET | Freq: Two times a day (BID) | ORAL | Status: DC

## 2023-08-13 MED ORDER — SULFAMETHOXAZOLE-TRIMETHOPRIM 800-160 MG PO TABS: 1.0000 | ORAL_TABLET | Freq: Two times a day (BID) | ORAL | 0 refills | Status: DC

## 2023-08-13 MED ORDER — FUROSEMIDE 40 MG PO TABS: 40.0000 mg | ORAL_TABLET | Freq: Three times a day (TID) | ORAL | Status: DC

## 2023-08-13 NOTE — Progress Notes (Signed)
 Subjective:  Patient ID: Jerry Fox, male    DOB: 03/21/1934  Age: 89 y.o. MRN: [id number]  CC: Edema (Swelling and redness in both legs. Pian in legs as well. Started 3 days ago. All throughout leg.)   HPI Jerry Fox presents for increasing redness and pain in the legs.  He has had some swelling chronically but had a marked increase over the last 3 days.  The redness is new as well.  It seems to be spreading from the right ankle up the medial aspect of the leg.  There is no shortness of breath.   Follow-up of hypertension. Patient has no history of headache chest pain or shortness of breath or recent cough. Patient also denies symptoms of TIA such as numbness weakness lateralizing. Patient checks  blood pressure at home and has not had any elevated readings recently. Patient denies side effects from his medication. States taking it regularly.      08/05/2023    3:26 PM 07/05/2023    1:40 PM 05/07/2023    3:31 PM  Depression screen PHQ 2/9  Decreased Interest 0 0 0  Down, Depressed, Hopeless 0 0 0  PHQ - 2 Score 0 0 0  Altered sleeping 0 0 0  Tired, decreased energy 0 0 0  Change in appetite 0 0 0  Feeling bad or failure about yourself  0 0 0  Trouble concentrating 0 0 0  Moving slowly or fidgety/restless 0 0 0  Suicidal thoughts 0 0 0  PHQ-9 Score 0 0 0  Difficult doing work/chores Not difficult at all Not difficult at all Not difficult at all    History Jerry Fox has a past medical history of AAA (abdominal aortic aneurysm) (HCC) (05/31/2012), Arthritis, Atrial fibrillation (HCC) (05/31/2012), Colon polyps, Hyperlipidemia, Hypertension, Murmur, cardiac, New onset atrial fibrillation (HCC) (06/10/2012), and Saddle pulmonary embolus (HCC) (05/31/2012).   He has a past surgical history that includes Tonsillectomy.   His family history includes Arthritis in his father and mother; Congestive Heart Failure in his brother, sister, and sister; Heart disease in his father,  mother, and paternal grandfather; Hypertension in his father; Stroke in his brother.He reports that he quit smoking about 69 years ago. His smoking use included cigarettes. He started smoking about 79 years ago. He has a 10 pack-year smoking history. He quit smokeless tobacco use about 59 years ago.  His smokeless tobacco use included chew. He reports that he does not drink alcohol and does not use drugs.    ROS Review of Systems  Constitutional:  Negative for fever.  Respiratory:  Negative for shortness of breath.   Cardiovascular:  Negative for chest pain.  Musculoskeletal:  Negative for arthralgias.  Skin:  Negative for rash.    Objective:  BP 127/87   Pulse (!) 115   Temp 97.7 F (36.5 C)   Ht 5\' 9"  (1.753 m)   Wt 240 lb (108.9 kg)   SpO2 97%   BMI 35.44 kg/m   BP Readings from Last 3 Encounters:  08/13/23 127/87  08/05/23 138/69  05/20/23 131/70    Wt Readings from Last 3 Encounters:  08/13/23 240 lb (108.9 kg)  08/05/23 242 lb (109.8 kg)  07/05/23 217 lb (98.4 kg)     Physical Exam Vitals reviewed.  Constitutional:      Appearance: He is well-developed.  HENT:     Head: Normocephalic and atraumatic.     Right Ear: External ear normal.     Left  Ear: External ear normal.     Mouth/Throat:     Pharynx: No oropharyngeal exudate or posterior oropharyngeal erythema.  Eyes:     Pupils: Pupils are equal, round, and reactive to light.  Cardiovascular:     Rate and Rhythm: Normal rate and regular rhythm.     Heart sounds: No murmur heard. Pulmonary:     Effort: No respiratory distress.     Breath sounds: Normal breath sounds.  Musculoskeletal:     Cervical back: Normal range of motion and neck supple.  Neurological:     Mental Status: He is alert and oriented to person, place, and time.      Assessment & Plan:  Generalized edema -     Brain natriuretic peptide -     CMP14+EGFR  Other orders -     Furosemide; Take 1 tablet (40 mg total) by mouth 3  (three) times daily. For swelling -     Sulfamethoxazole-Trimethoprim; Take 1 tablet by mouth 2 (two) times daily. Until gone, for infection  Dispense: 20 tablet; Refill: 0 -     Potassium Chloride ER; Take 1 tablet (10 mEq total) by mouth 2 (two) times daily.     Follow-up: Return in about 1 week (around 08/20/2023).  Roise Cleaver, M.D.

## 2023-08-14 LAB — CMP14+EGFR
ALT: 16 IU/L (ref 0–44)
AST: 23 IU/L (ref 0–40)
Albumin: 3.8 g/dL (ref 3.7–4.7)
Alkaline Phosphatase: 99 IU/L (ref 44–121)
BUN/Creatinine Ratio: 12 (ref 10–24)
BUN: 13 mg/dL (ref 8–27)
Bilirubin Total: 0.5 mg/dL (ref 0.0–1.2)
CO2: 26 mmol/L (ref 20–29)
Calcium: 9.2 mg/dL (ref 8.6–10.2)
Chloride: 99 mmol/L (ref 96–106)
Creatinine, Ser: 1.11 mg/dL (ref 0.76–1.27)
Globulin, Total: 2.6 g/dL (ref 1.5–4.5)
Glucose: 86 mg/dL (ref 70–99)
Potassium: 4.4 mmol/L (ref 3.5–5.2)
Sodium: 138 mmol/L (ref 134–144)
Total Protein: 6.4 g/dL (ref 6.0–8.5)
eGFR: 64 mL/min/{1.73_m2} (ref >59)

## 2023-08-14 LAB — BRAIN NATRIURETIC PEPTIDE: BNP: 315.5 pg/mL — ABNORMAL HIGH (ref 0.0–100.0)

## 2023-08-14 NOTE — Progress Notes (Signed)
Hello Jerry Fox,  Your lab result is normal and/or stable.Some minor variations that are not significant are commonly marked abnormal, but do not represent any medical problem for you.  Best regards, Maryah Marinaro, M.D.

## 2023-08-20 ENCOUNTER — Ambulatory Visit (INDEPENDENT_AMBULATORY_CARE_PROVIDER_SITE_OTHER): Admitting: Family Medicine

## 2023-08-20 ENCOUNTER — Encounter: Payer: Self-pay | Admitting: Family Medicine

## 2023-08-20 VITALS — BP 112/67 | HR 92 | Temp 97.9°F | Ht 69.0 in | Wt 218.0 lb

## 2023-08-20 DIAGNOSIS — G301 Alzheimer's disease with late onset: Secondary | ICD-10-CM

## 2023-08-20 DIAGNOSIS — F02A0 Dementia in other diseases classified elsewhere, mild, without behavioral disturbance, psychotic disturbance, mood disturbance, and anxiety: Secondary | ICD-10-CM

## 2023-08-20 DIAGNOSIS — R600 Localized edema: Secondary | ICD-10-CM

## 2023-08-20 DIAGNOSIS — I872 Venous insufficiency (chronic) (peripheral): Secondary | ICD-10-CM | POA: Diagnosis not present

## 2023-08-20 MED ORDER — FUROSEMIDE 40 MG PO TABS: 80.0000 mg | ORAL_TABLET | Freq: Two times a day (BID) | ORAL | 1 refills | Status: DC

## 2023-08-20 NOTE — Progress Notes (Signed)
 Subjective:  Patient ID: Jerry Fox, male    DOB: 07-Jan-1934  Age: 89 y.o. MRN: [id number]  CC: Follow-up (No concerns at this time. )   HPI Jerry Fox presents for continued swelling in the legs.  Patient has dementia and forgot that he was post to do this.  His wife tells me that he has not been elevating his legs.  They have been taking the Lasix  3 times a day as well as the potassium twice a day.  There is no shortness of breath.  He denies pain.  The redness has gone down significantly with the use of the sulfa  antibiotic from last week.     08/20/2023    3:22 PM 08/05/2023    3:26 PM 07/05/2023    1:40 PM  Depression screen PHQ 2/9  Decreased Interest 0 0 0  Down, Depressed, Hopeless 0 0 0  PHQ - 2 Score 0 0 0  Altered sleeping 0 0 0  Tired, decreased energy 0 0 0  Change in appetite 0 0 0  Feeling bad or failure about yourself  0 0 0  Trouble concentrating 0 0 0  Moving slowly or fidgety/restless 0 0 0  Suicidal thoughts 0 0 0  PHQ-9 Score 0 0 0  Difficult doing work/chores Not difficult at all Not difficult at all Not difficult at all    History Jerry Fox has a past medical history of AAA (abdominal aortic aneurysm) (HCC) (05/31/2012), Arthritis, Atrial fibrillation (HCC) (05/31/2012), Colon polyps, Hyperlipidemia, Hypertension, Murmur, cardiac, New onset atrial fibrillation (HCC) (06/10/2012), and Saddle pulmonary embolus (HCC) (05/31/2012).   He has a past surgical history that includes Tonsillectomy.   His family history includes Arthritis in his father and mother; Congestive Heart Failure in his brother, sister, and sister; Heart disease in his father, mother, and paternal grandfather; Hypertension in his father; Stroke in his brother.He reports that he quit smoking about 69 years ago. His smoking use included cigarettes. He started smoking about 79 years ago. He has a 10 pack-year smoking history. He quit smokeless tobacco use about 59 years ago.  His smokeless  tobacco use included chew. He reports that he does not drink alcohol and does not use drugs.    ROS Review of Systems  Objective:  BP 112/67   Pulse 92   Temp 97.9 F (36.6 C)   Ht 5\' 9"  (1.753 m)   Wt 218 lb (98.9 kg)   SpO2 98%   BMI 32.19 kg/m   BP Readings from Last 3 Encounters:  08/20/23 112/67  08/13/23 127/87  08/05/23 138/69    Wt Readings from Last 3 Encounters:  08/20/23 218 lb (98.9 kg)  08/13/23 240 lb (108.9 kg)  08/05/23 242 lb (109.8 kg)     Physical Exam Vitals reviewed.  Constitutional:      Appearance: He is well-developed.  HENT:     Head: Normocephalic and atraumatic.     Right Ear: External ear normal.     Left Ear: External ear normal.     Mouth/Throat:     Pharynx: No oropharyngeal exudate or posterior oropharyngeal erythema.  Eyes:     Pupils: Pupils are equal, round, and reactive to light.  Cardiovascular:     Rate and Rhythm: Normal rate and regular rhythm.     Heart sounds: No murmur heard. Pulmonary:     Effort: No respiratory distress.     Breath sounds: Normal breath sounds.  Musculoskeletal:     Cervical  back: Normal range of motion and neck supple.     Left lower leg: Edema (3+ pitting to the tibial plateau.  No erythema) present.  Skin:    General: Skin is warm and dry.     Findings: Erythema (Mild erythema noted at the right lower extremity with 2+ edema.) present.  Neurological:     Mental Status: He is alert and oriented to person, place, and time.      Assessment & Plan:  Edema of both lower legs -     BMP8+EGFR  Venous insufficiency of both lower extremities  Mild late onset Alzheimer's dementia without behavioral disturbance, psychotic disturbance, mood disturbance, or anxiety (HCC)  Other orders -     Furosemide ; Take 2 tablets (80 mg total) by mouth 2 (two) times daily. At breakfast and again in the early afternoonFor swelling  Dispense: 120 tablet; Refill: 1  Keep your legs elevated higher than your  heart most of the time, every day!  We had a long discussion about elevating the legs and ways that he could remember wife is going to help him remind him and work on make a sign that he can put up to remind him. Follow-up: Return in about 1 week (around 08/27/2023), or edema.  Jerry Fox, M.D.

## 2023-08-20 NOTE — Patient Instructions (Signed)
 Keep your legs elevated higher than your heart most of the time, every day!

## 2023-08-21 LAB — BMP8+EGFR
BUN/Creatinine Ratio: 12 (ref 10–24)
BUN: 15 mg/dL (ref 8–27)
CO2: 26 mmol/L (ref 20–29)
Calcium: 9 mg/dL (ref 8.6–10.2)
Chloride: 98 mmol/L (ref 96–106)
Creatinine, Ser: 1.3 mg/dL — ABNORMAL HIGH (ref 0.76–1.27)
Glucose: 97 mg/dL (ref 70–99)
Potassium: 4.4 mmol/L (ref 3.5–5.2)
Sodium: 138 mmol/L (ref 134–144)
eGFR: 53 mL/min/{1.73_m2} — ABNORMAL LOW (ref >59)

## 2023-08-22 ENCOUNTER — Telehealth: Payer: Self-pay

## 2023-08-22 NOTE — Telephone Encounter (Signed)
 Pt's wife called with questions regarding his medications in particularis furosemide  and potassium. Advised pt should be on Furosemide  80mg  twice daily and potassium 10meq twice daily per current med list and wife voiced understanding.

## 2023-08-22 NOTE — Telephone Encounter (Signed)
 Copied from CRM [id number]. Topic: Clinical - Medication Question >> Aug 22, 2023 10:46 AM Zipporah Him wrote: Reason for CRM: Patients wife has questions about patients dosages of medication, she states they were recently changed and just would like to clarify. Please call ASAP.

## 2023-08-28 ENCOUNTER — Encounter: Payer: Self-pay | Admitting: Family Medicine

## 2023-08-28 ENCOUNTER — Ambulatory Visit (INDEPENDENT_AMBULATORY_CARE_PROVIDER_SITE_OTHER): Admitting: Family Medicine

## 2023-08-28 VITALS — BP 106/69 | HR 89 | Temp 98.4°F | Ht 69.0 in | Wt 210.0 lb

## 2023-08-28 DIAGNOSIS — F02A0 Dementia in other diseases classified elsewhere, mild, without behavioral disturbance, psychotic disturbance, mood disturbance, and anxiety: Secondary | ICD-10-CM | POA: Diagnosis not present

## 2023-08-28 DIAGNOSIS — G301 Alzheimer's disease with late onset: Secondary | ICD-10-CM

## 2023-08-28 DIAGNOSIS — R600 Localized edema: Secondary | ICD-10-CM

## 2023-08-28 DIAGNOSIS — I10 Essential (primary) hypertension: Secondary | ICD-10-CM | POA: Diagnosis not present

## 2023-08-28 DIAGNOSIS — I872 Venous insufficiency (chronic) (peripheral): Secondary | ICD-10-CM | POA: Diagnosis not present

## 2023-08-28 NOTE — Progress Notes (Signed)
 Subjective:  Patient ID: Jerry Fox, male    DOB: 1934-02-17  Age: 89 y.o. MRN: [id number]  CC: Edema (Pt states edema is better but son says it is not. Legs appear swollen and tight. ) and Hypertension (Son brought BP readings and asked if you could calibrate BP monitor. )   HPI CHUNG SUAREZ presents for son is here today wondering why we cannot put him in the hospital to get rid of the fluid.  He also wonders why we cannot give him oxycodone  for his back.  He will lay down to get rid of the fluid because his back hurts so bad.  He states gabapentin  never did him any good so he can see why his dad does not get relief.  Mr. Hemp has just enough to mention that it is unclear if he is processing what we are talking about.  But he does not admit to back pain today.  He is known to have chronic back pain in the past however.  Additionally he is taking the gabapentin  it is difficult to assess just how effective it is.  Mr. Isobe denies pain but he endorses swelling that continues in the legs.     08/20/2023    3:22 PM 08/05/2023    3:26 PM 07/05/2023    1:40 PM  Depression screen PHQ 2/9  Decreased Interest 0 0 0  Down, Depressed, Hopeless 0 0 0  PHQ - 2 Score 0 0 0  Altered sleeping 0 0 0  Tired, decreased energy 0 0 0  Change in appetite 0 0 0  Feeling bad or failure about yourself  0 0 0  Trouble concentrating 0 0 0  Moving slowly or fidgety/restless 0 0 0  Suicidal thoughts 0 0 0  PHQ-9 Score 0 0 0  Difficult doing work/chores Not difficult at all Not difficult at all Not difficult at all    History Norvin has a past medical history of AAA (abdominal aortic aneurysm) (HCC) (05/31/2012), Arthritis, Atrial fibrillation (HCC) (05/31/2012), Colon polyps, Hyperlipidemia, Hypertension, Murmur, cardiac, New onset atrial fibrillation (HCC) (06/10/2012), and Saddle pulmonary embolus (HCC) (05/31/2012).   He has a past surgical history that includes Tonsillectomy.   His family  history includes Arthritis in his father and mother; Congestive Heart Failure in his brother, sister, and sister; Heart disease in his father, mother, and paternal grandfather; Hypertension in his father; Stroke in his brother.He reports that he quit smoking about 69 years ago. His smoking use included cigarettes. He started smoking about 79 years ago. He has a 10 pack-year smoking history. He quit smokeless tobacco use about 59 years ago.  His smokeless tobacco use included chew. He reports that he does not drink alcohol and does not use drugs.    ROS Review of Systems  Constitutional:  Negative for fever.  Respiratory:  Negative for shortness of breath.   Cardiovascular:  Positive for leg swelling. Negative for chest pain and palpitations.  Musculoskeletal:  Positive for back pain.  Skin:  Negative for rash.    Objective:  BP 106/69   Pulse 89   Temp 98.4 F (36.9 C)   Ht 5\' 9"  (1.753 m)   Wt 210 lb (95.3 kg)   SpO2 97%   BMI 31.01 kg/m   BP Readings from Last 3 Encounters:  08/28/23 106/69  08/20/23 112/67  08/13/23 127/87    Wt Readings from Last 3 Encounters:  08/28/23 210 lb (95.3 kg)  08/20/23 218 lb (  98.9 kg)  08/13/23 240 lb (108.9 kg)     Physical Exam Vitals reviewed.  Constitutional:      Appearance: He is well-developed.  HENT:     Head: Normocephalic and atraumatic.     Right Ear: External ear normal.     Left Ear: External ear normal.     Mouth/Throat:     Pharynx: No oropharyngeal exudate or posterior oropharyngeal erythema.  Eyes:     Pupils: Pupils are equal, round, and reactive to light.  Cardiovascular:     Rate and Rhythm: Normal rate and regular rhythm.     Heart sounds: No murmur heard. Pulmonary:     Effort: No respiratory distress.     Breath sounds: Normal breath sounds.  Musculoskeletal:        General: Swelling (2-3+ both lower extremities.  No erythema.) present.     Cervical back: Normal range of motion and neck supple.   Neurological:     General: No focal deficit present.     Mental Status: He is alert.     Comments: Oriented to person      Assessment & Plan:  Edema of both lower legs -     BMP8+EGFR  Venous insufficiency of both lower extremities -     BMP8+EGFR  Mild late onset Alzheimer's dementia without behavioral disturbance, psychotic disturbance, mood disturbance, or anxiety (HCC) -     BMP8+EGFR  Primary hypertension -     BMP8+EGFR  I think we have pushed the Lasix  about as high as we should for now.  Of note last week's BMP did show a bump in the creatinine.  Will repeat that today.  That result will determine if we press the Lasix  and potassium further.  Meanwhile reinforced with both his son and him that he needs to keep his legs elevated about 8 hours a day.  When he is in bed at night he needs to have the feet elevated on a couple of pillows.   Follow-up: Return in about 2 weeks (around 09/11/2023).  Roise Cleaver, M.D.

## 2023-08-29 ENCOUNTER — Telehealth: Payer: Self-pay

## 2023-08-29 LAB — BMP8+EGFR
BUN/Creatinine Ratio: 17 (ref 10–24)
BUN: 21 mg/dL (ref 8–27)
CO2: 28 mmol/L (ref 20–29)
Calcium: 9.3 mg/dL (ref 8.6–10.2)
Chloride: 96 mmol/L (ref 96–106)
Creatinine, Ser: 1.25 mg/dL (ref 0.76–1.27)
Glucose: 87 mg/dL (ref 70–99)
Potassium: 4.9 mmol/L (ref 3.5–5.2)
Sodium: 137 mmol/L (ref 134–144)
eGFR: 56 mL/min/{1.73_m2} — ABNORMAL LOW (ref >59)

## 2023-08-29 NOTE — Telephone Encounter (Signed)
 Clarified with patients wife. LS

## 2023-08-29 NOTE — Telephone Encounter (Signed)
 Copied from CRM [id number]. Topic: Clinical - Medication Question >> Aug 29, 2023  3:33 PM Shelby Dessert H wrote: Reason for CRM: Patient wife wants to know how much potassium he's suppose to be taking with his furosemide  (LASIX ) 40 MG tablet, he takes 4 pills, patients wife's callback number is [phone].

## 2023-09-01 NOTE — Progress Notes (Signed)
Hello Jerry Fox,  Your lab result is normal and/or stable.Some minor variations that are not significant are commonly marked abnormal, but do not represent any medical problem for you.  Best regards, Maryah Marinaro, M.D.

## 2023-09-12 ENCOUNTER — Other Ambulatory Visit: Payer: Self-pay | Admitting: Family Medicine

## 2023-09-12 MED ORDER — POTASSIUM CHLORIDE ER 10 MEQ PO TBCR: 10.0000 meq | EXTENDED_RELEASE_TABLET | Freq: Two times a day (BID) | ORAL | Status: DC

## 2023-09-12 MED ORDER — APIXABAN 5 MG PO TABS: 5.0000 mg | ORAL_TABLET | Freq: Two times a day (BID) | ORAL | 3 refills | Status: DC

## 2023-09-12 MED ORDER — TRAZODONE HCL 50 MG PO TABS: 50.0000 mg | ORAL_TABLET | Freq: Every day | ORAL | 3 refills | Status: DC

## 2023-09-12 MED ORDER — LISINOPRIL 10 MG PO TABS
10.0000 mg | ORAL_TABLET | Freq: Every day | ORAL | 3 refills | Status: DC
Start: 2023-09-12 — End: 2023-11-20

## 2023-09-12 MED ORDER — CHOLECALCIFEROL 50 MCG (2000 UT) PO CAPS: 1.0000 | ORAL_CAPSULE | Freq: Every day | ORAL | 11 refills | Status: AC

## 2023-09-12 MED ORDER — FUROSEMIDE 40 MG PO TABS: 80.0000 mg | ORAL_TABLET | Freq: Two times a day (BID) | ORAL | 1 refills | Status: DC

## 2023-09-12 MED ORDER — GABAPENTIN 300 MG PO CAPS: 600.0000 mg | ORAL_CAPSULE | Freq: Two times a day (BID) | ORAL | 1 refills | Status: AC

## 2023-09-12 NOTE — Telephone Encounter (Signed)
 Copied from CRM [id number]. Topic: Clinical - Medication Refill >> Sep 12, 2023  8:57 AM Blair Bumpers wrote: Medication: apixaban  (ELIQUIS ) 5 MG TABS tablet, amlodipine  5 mg, Cholecalciferol 50 MCG (2000 UT) CAPS, furosemide  (LASIX ) 40 MG tablet,  gabapentin  (NEURONTIN ) 300 MG capsule, lovastatin  40 mg, potassium chloride  (KLOR-CON ) 10 MEQ tablet, tizanidine  4 mg, valsartan  320 mg   Has the patient contacted their pharmacy? Yes (Agent: If no, request that the patient contact the pharmacy for the refill. If patient does not wish to contact the pharmacy document the reason why and proceed with request.) (Agent: If yes, when and what did the pharmacy advise?)  This is the patient's preferred pharmacy:  [facility] - [place], [place] - [address] [place] [address] Phone: [phone] Fax: [phone]  Is this the correct pharmacy for this prescription? Yes If no, delete pharmacy and type the correct one.   Has the prescription been filled recently? Yes  Is the patient out of the medication? Yes  Has the patient been seen for an appointment in the last year OR does the patient have an upcoming appointment? Yes  Can we respond through MyChart? No  Agent: Please be advised that Rx refills may take up to 3 business days. We ask that you follow-up with your pharmacy.

## 2023-09-12 NOTE — Telephone Encounter (Signed)
 The patient has mentioned several medications that are not currently on their medication list.

## 2023-09-16 ENCOUNTER — Ambulatory Visit: Admitting: Family Medicine

## 2023-09-17 ENCOUNTER — Ambulatory Visit (INDEPENDENT_AMBULATORY_CARE_PROVIDER_SITE_OTHER): Admitting: Family Medicine

## 2023-09-17 ENCOUNTER — Encounter: Payer: Self-pay | Admitting: Family Medicine

## 2023-09-17 VITALS — BP 110/69 | HR 73 | Temp 97.5°F | Ht 69.0 in

## 2023-09-17 DIAGNOSIS — G301 Alzheimer's disease with late onset: Secondary | ICD-10-CM | POA: Insufficient documentation

## 2023-09-17 DIAGNOSIS — I872 Venous insufficiency (chronic) (peripheral): Secondary | ICD-10-CM | POA: Insufficient documentation

## 2023-09-17 DIAGNOSIS — F02A0 Dementia in other diseases classified elsewhere, mild, without behavioral disturbance, psychotic disturbance, mood disturbance, and anxiety: Secondary | ICD-10-CM | POA: Insufficient documentation

## 2023-09-17 NOTE — Progress Notes (Signed)
 Subjective:  Patient ID: Jerry Fox, male    DOB: 12-Oct-1933  Age: 89 y.o. MRN: [id number]  CC: Leg Swelling (Swelling and redness has gone down. )   HPI Marylen Snowman presents for chronic edema of both legs.  His wife has been working diligently to remind him to elevate the legs putting pillows under his legs etc. making sure he stays in his recliner is much as possible.  However due to the patient's dementia he has not been highly cooperative and some edema remains.  They are currently using Lasix  80 mg twice daily.     08/20/2023    3:22 PM 08/05/2023    3:26 PM 07/05/2023    1:40 PM  Depression screen PHQ 2/9  Decreased Interest 0 0 0  Down, Depressed, Hopeless 0 0 0  PHQ - 2 Score 0 0 0  Altered sleeping 0 0 0  Tired, decreased energy 0 0 0  Change in appetite 0 0 0  Feeling bad or failure about yourself  0 0 0  Trouble concentrating 0 0 0  Moving slowly or fidgety/restless 0 0 0  Suicidal thoughts 0 0 0  PHQ-9 Score 0 0 0  Difficult doing work/chores Not difficult at all Not difficult at all Not difficult at all    History Jeffey has a past medical history of AAA (abdominal aortic aneurysm) (HCC) (05/31/2012), Arthritis, Atrial fibrillation (HCC) (05/31/2012), Colon polyps, Hyperlipidemia, Hypertension, Murmur, cardiac, New onset atrial fibrillation (HCC) (06/10/2012), and Saddle pulmonary embolus (HCC) (05/31/2012).   He has a past surgical history that includes Tonsillectomy.   His family history includes Arthritis in his father and mother; Congestive Heart Failure in his brother, sister, and sister; Heart disease in his father, mother, and paternal grandfather; Hypertension in his father; Stroke in his brother.He reports that he quit smoking about 69 years ago. His smoking use included cigarettes. He started smoking about 79 years ago. He has a 10 pack-year smoking history. He quit smokeless tobacco use about 59 years ago.  His smokeless tobacco use included chew.  He reports that he does not drink alcohol and does not use drugs.    ROS Review of Systems  Unable to perform ROS: Dementia  Constitutional:  Negative for fever.  Respiratory:  Negative for shortness of breath.   Cardiovascular:  Negative for chest pain.  Musculoskeletal:  Negative for arthralgias.  Skin:  Negative for rash.    Objective:  BP 110/69   Pulse 73   Temp (!) 97.5 F (36.4 C)   Ht 5\' 9"  (1.753 m)   SpO2 98%   BMI 31.01 kg/m   BP Readings from Last 3 Encounters:  09/17/23 110/69  08/28/23 106/69  08/20/23 112/67    Wt Readings from Last 3 Encounters:  08/28/23 210 lb (95.3 kg)  08/20/23 218 lb (98.9 kg)  08/13/23 240 lb (108.9 kg)     Physical Exam Vitals reviewed.  Constitutional:      Appearance: He is well-developed.  HENT:     Head: Normocephalic and atraumatic.     Right Ear: External ear normal.     Left Ear: External ear normal.     Mouth/Throat:     Pharynx: No oropharyngeal exudate or posterior oropharyngeal erythema.  Eyes:     Pupils: Pupils are equal, round, and reactive to light.  Cardiovascular:     Rate and Rhythm: Normal rate and regular rhythm.     Heart sounds: No murmur heard. Pulmonary:  Effort: No respiratory distress.     Breath sounds: Normal breath sounds.  Musculoskeletal:     Cervical back: Normal range of motion and neck supple.     Right lower leg: Edema (2+ to 3+ both lower extremities) present.     Left lower leg: Edema present.  Skin:    Findings: No erythema (Noted on previous visits has resolved).  Neurological:     Mental Status: He is alert and oriented to person, place, and time.      Assessment & Plan:  Venous insufficiency of both lower extremities -     BMP8+EGFR  Mild late onset Alzheimer's dementia without behavioral disturbance, psychotic disturbance, mood disturbance, or anxiety (HCC)   Patient needs supervision from his wife to continue treatment for the edema.  Additionally he  continues to take donepezil daily.  No side effects have been noted.  Follow-up: Return in about 6 weeks (around 10/29/2023).  Roise Cleaver, M.D.

## 2023-09-18 LAB — BMP8+EGFR
BUN/Creatinine Ratio: 15 (ref 10–24)
BUN: 14 mg/dL (ref 8–27)
CO2: 25 mmol/L (ref 20–29)
Calcium: 9.2 mg/dL (ref 8.6–10.2)
Chloride: 100 mmol/L (ref 96–106)
Creatinine, Ser: 0.93 mg/dL (ref 0.76–1.27)
Glucose: 98 mg/dL (ref 70–99)
Potassium: 4.3 mmol/L (ref 3.5–5.2)
Sodium: 139 mmol/L (ref 134–144)
eGFR: 79 mL/min/{1.73_m2} (ref >59)

## 2023-09-19 ENCOUNTER — Other Ambulatory Visit: Payer: Self-pay | Admitting: Family Medicine

## 2023-09-19 ENCOUNTER — Ambulatory Visit: Payer: Self-pay | Admitting: Family Medicine

## 2023-09-19 NOTE — Telephone Encounter (Signed)
 Copied from CRM [id number]. Topic: Clinical - Medication Refill >> Sep 19, 2023 11:02 AM Cynthia K wrote: Medication:  metoprolol  succinate (TOPROL -XL) 50 MG 24 hr tablet  Has the patient contacted their pharmacy? Yes (Agent: If no, request that the patient contact the pharmacy for the refill. If patient does not wish to contact the pharmacy document the reason why and proceed with request.) (Agent: If yes, when and what did the pharmacy advise?) Pharmacy needs order to refill  This is the patient's preferred pharmacy:  [facility] [place], [place] - [address] [place] [address] Phone: [phone] Fax: [phone]  Is this the correct pharmacy for this prescription? Yes If no, delete pharmacy and type the correct one.   Has the prescription been filled recently? No  Is the patient out of the medication? No - He is out of medication   Has the patient been seen for an appointment in the last year OR does the patient have an upcoming appointment? Yes  Can we respond through MyChart? No  Agent: Please be advised that Rx refills may take up to 3 business days. We ask that you follow-up with your pharmacy. >> Sep 19, 2023 11:26 AM Emylou G wrote: Patient called.. to please cancel doesn't need!!

## 2023-09-19 NOTE — Telephone Encounter (Signed)
Noted  -LS

## 2023-09-19 NOTE — Telephone Encounter (Signed)
 Copied from CRM [id number]. Topic: Clinical - Medication Refill >> Sep 19, 2023 11:26 AM Emylou G wrote: Medication: lisinopril  (ZESTRIL ) 10 MG tablet  Has the patient contacted their pharmacy? No (Agent: If no, request that the patient contact the pharmacy for the refill. If patient does not wish to contact the pharmacy document the reason why and proceed with request.) (Agent: If yes, when and what did the pharmacy advise?)  This is the patient's preferred pharmacy:  [facility] [place], [place] - [address] [place] [address] Phone: [phone] Fax: [phone]  Is this the correct pharmacy for this prescription? Yes If no, delete pharmacy and type the correct one.   Has the prescription been filled recently? No  Is the patient out of the medication? Yes  Has the patient been seen for an appointment in the last year OR does the patient have an upcoming appointment? Yes  Can we respond through MyChart? No  Agent: Please be advised that Rx refills may take up to 3 business days. We ask that you follow-up with your pharmacy.

## 2023-09-19 NOTE — Telephone Encounter (Signed)
 Copied from CRM [id number]. Topic: Clinical - Medication Refill >> Sep 19, 2023 11:36 AM Brynn Caras wrote: Medication: apixaban  (ELIQUIS ) 5 MG TABS tablet  Has the patient contacted their pharmacy? Yes (Agent: If no, request that the patient contact the pharmacy for the refill. If patient does not wish to contact the pharmacy document the reason why and proceed with request.) (Agent: If yes, when and what did the pharmacy advise?)  This is the patient's preferred pharmacy:   [facility] - [place], [place] - [address] [place] [address] Phone: [phone] Fax: [phone]  Is this the correct pharmacy for this prescription? Yes If no, delete pharmacy and type the correct one.   Has the prescription been filled recently? No  Is the patient out of the medication? Yes  Has the patient been seen for an appointment in the last year OR does the patient have an upcoming appointment? Yes  Can we respond through MyChart? No, callback preferred.  Agent: Please be advised that Rx refills may take up to 3 business days. We ask that you follow-up with your pharmacy.

## 2023-09-19 NOTE — Telephone Encounter (Signed)
 Copied from CRM [id number]. Topic: Clinical - Medication Refill >> Sep 19, 2023 11:02 AM Cynthia K wrote: Medication:  metoprolol  succinate (TOPROL -XL) 50 MG 24 hr tablet  Has the patient contacted their pharmacy? Yes (Agent: If no, request that the patient contact the pharmacy for the refill. If patient does not wish to contact the pharmacy document the reason why and proceed with request.) (Agent: If yes, when and what did the pharmacy advise?) Pharmacy needs order to refill  This is the patient's preferred pharmacy:  [facility] [place], [place] - [address] [place] [address] Phone: [phone] Fax: [phone]  Is this the correct pharmacy for this prescription? Yes If no, delete pharmacy and type the correct one.   Has the prescription been filled recently? No  Is the patient out of the medication? No - He is out of medication   Has the patient been seen for an appointment in the last year OR does the patient have an upcoming appointment? Yes  Can we respond through MyChart? No  Agent: Please be advised that Rx refills may take up to 3 business days. We ask that you follow-up with your pharmacy.

## 2023-09-19 NOTE — Progress Notes (Signed)
Hello Jerry Fox,  Your lab result is normal and/or stable.Some minor variations that are not significant are commonly marked abnormal, but do not represent any medical problem for you.  Best regards, Maryah Marinaro, M.D.

## 2023-10-29 ENCOUNTER — Encounter: Payer: Self-pay | Admitting: Family Medicine

## 2023-10-29 ENCOUNTER — Ambulatory Visit: Admitting: Family Medicine

## 2023-10-29 VITALS — BP 93/60 | HR 59 | Temp 98.2°F | Ht 69.0 in | Wt 206.0 lb

## 2023-10-29 DIAGNOSIS — F02A0 Dementia in other diseases classified elsewhere, mild, without behavioral disturbance, psychotic disturbance, mood disturbance, and anxiety: Secondary | ICD-10-CM

## 2023-10-29 DIAGNOSIS — G301 Alzheimer's disease with late onset: Secondary | ICD-10-CM

## 2023-10-29 DIAGNOSIS — I10 Essential (primary) hypertension: Secondary | ICD-10-CM

## 2023-10-29 DIAGNOSIS — R600 Localized edema: Secondary | ICD-10-CM

## 2023-10-29 DIAGNOSIS — I872 Venous insufficiency (chronic) (peripheral): Secondary | ICD-10-CM

## 2023-10-29 MED ORDER — POTASSIUM CHLORIDE ER 10 MEQ PO TBCR: 10.0000 meq | EXTENDED_RELEASE_TABLET | Freq: Two times a day (BID) | ORAL | Status: DC

## 2023-10-29 MED ORDER — FUROSEMIDE 40 MG PO TABS: 80.0000 mg | ORAL_TABLET | Freq: Two times a day (BID) | ORAL | 1 refills | Status: AC

## 2023-10-29 NOTE — Progress Notes (Signed)
 Subjective:  Patient ID: Jerry Fox, male    DOB: 1933-10-13  Age: 89 y.o. MRN: [id number]  CC: No chief complaint on file.   HPI Jerry Fox presents for  follow-up of hypertension. Patient has no history of headache chest pain or shortness of breath or recent cough. Patient also denies symptoms of TIA such as focal numbness or weakness. Patient denies side effects from medication. States taking it regularly. His wife gives much of his history.  He still struggles with dementia.  He is getting even more forgetful.  However both of them confirmed that he is putting his feet up more and his swelling has lessened.  History Jerry Fox has a past medical history of AAA (abdominal aortic aneurysm) (HCC) (05/31/2012), Arthritis, Atrial fibrillation (HCC) (05/31/2012), Colon polyps, Hyperlipidemia, Hypertension, Murmur, cardiac, New onset atrial fibrillation (HCC) (06/10/2012), and Saddle pulmonary embolus (HCC) (05/31/2012).   He has a past surgical history that includes Tonsillectomy.   His family history includes Arthritis in his father and mother; Congestive Heart Failure in his brother, sister, and sister; Heart disease in his father, mother, and paternal grandfather; Hypertension in his father; Stroke in his brother.He reports that he quit smoking about 69 years ago. His smoking use included cigarettes. He started smoking about 79 years ago. He has a 10 pack-year smoking history. He quit smokeless tobacco use about 59 years ago.  His smokeless tobacco use included chew. He reports that he does not drink alcohol and does not use drugs.  Current Outpatient Medications on File Prior to Visit  Medication Sig Dispense Refill   acetaminophen  (TYLENOL ) 500 MG tablet Take 1,000 mg by mouth 3 (three) times daily.     apixaban  (ELIQUIS ) 5 MG TABS tablet Take 1 tablet (5 mg total) by mouth 2 (two) times daily. 180 tablet 3   Cholecalciferol  50 MCG (2000 UT) CAPS Take 1 capsule (2,000 Units total)  by mouth daily. 30 capsule 11   dorzolamide -timolol  (COSOPT ) 22.3-6.8 MG/ML ophthalmic solution Place 1 drop into both eyes 2 (two) times daily.     gabapentin  (NEURONTIN ) 300 MG capsule Take 2 capsules (600 mg total) by mouth 2 (two) times daily. 360 capsule 1   hydrALAZINE  (APRESOLINE ) 50 MG tablet Take 50 mg by mouth 3 (three) times daily.     latanoprost  (XALATAN ) 0.005 % ophthalmic solution Place 1 drop into both eyes at bedtime.     lisinopril  (ZESTRIL ) 10 MG tablet Take 1 tablet (10 mg total) by mouth daily. 90 tablet 3   metoprolol  succinate (TOPROL -XL) 50 MG 24 hr tablet Take 1 tablet (50 mg total) by mouth daily. Take with or immediately following a meal. 90 tablet 3   traZODone  (DESYREL ) 50 MG tablet Take 1 tablet (50 mg total) by mouth at bedtime. 90 tablet 3   triamcinolone  cream (KENALOG ) 0.1 % Apply 1 Application topically 2 (two) times daily. to affected area(s)     albuterol  (VENTOLIN  HFA) 108 (90 Base) MCG/ACT inhaler Inhale 2 puffs into the lungs every 4 (four) hours as needed for wheezing or shortness of breath. (Patient not taking: Reported on 10/29/2023) 18 g 0   No current facility-administered medications on file prior to visit.    ROS Review of Systems  Constitutional:  Negative for fever.  Respiratory:  Negative for shortness of breath.   Cardiovascular:  Negative for chest pain.  Musculoskeletal:  Negative for arthralgias.  Skin:  Negative for rash.    Objective:  BP 93/60   Pulse ROLLEN)  59   Temp 98.2 F (36.8 C)   Ht 5' 9 (1.753 m)   Wt 206 lb (93.4 kg)   SpO2 96%   BMI 30.42 kg/m   BP Readings from Last 3 Encounters:  10/29/23 93/60  09/17/23 110/69  08/28/23 106/69    Wt Readings from Last 3 Encounters:  10/29/23 206 lb (93.4 kg)  08/28/23 210 lb (95.3 kg)  08/20/23 218 lb (98.9 kg)     Physical Exam Vitals reviewed.  Constitutional:      Appearance: He is well-developed.  HENT:     Head: Normocephalic and atraumatic.     Right Ear:  External ear normal.     Left Ear: External ear normal.     Mouth/Throat:     Pharynx: No oropharyngeal exudate or posterior oropharyngeal erythema.  Eyes:     Pupils: Pupils are equal, round, and reactive to light.  Cardiovascular:     Rate and Rhythm: Normal rate and regular rhythm.     Heart sounds: No murmur heard. Pulmonary:     Effort: No respiratory distress.     Breath sounds: Normal breath sounds.  Musculoskeletal:        General: Swelling (There is edema noted in each leg.  This is approximately 2+.  There is no stasis dermatitis currently.) present.     Cervical back: Normal range of motion and neck supple.  Neurological:     Mental Status: He is alert and oriented to person, place, and time.       Assessment & Plan:  Primary hypertension -     BMP8+EGFR  Mild late onset Alzheimer's dementia without behavioral disturbance, psychotic disturbance, mood disturbance, or anxiety (HCC) -     BMP8+EGFR  Venous insufficiency of both lower extremities -     BMP8+EGFR  Edema of both lower legs -     BMP8+EGFR  Other orders -     Furosemide ; Take 2 tablets (80 mg total) by mouth 2 (two) times daily. At breakfast and again in the early afternoonFor swelling  Dispense: 120 tablet; Refill: 1    Allergies as of 10/29/2023       Reactions   Diltiazem  Rash        Medication List        Accurate as of October 29, 2023 11:59 PM. If you have any questions, ask your nurse or doctor.          STOP taking these medications    sulfamethoxazole -trimethoprim  800-160 MG tablet Commonly known as: BACTRIM  DS Stopped by: Lafern Brinkley       TAKE these medications    acetaminophen  500 MG tablet Commonly known as: TYLENOL  Take 1,000 mg by mouth 3 (three) times daily.   apixaban  5 MG Tabs tablet Commonly known as: ELIQUIS  Take 1 tablet (5 mg total) by mouth 2 (two) times daily.   Cholecalciferol  50 MCG (2000 UT) Caps Take 1 capsule (2,000 Units total) by mouth  daily.   dorzolamide -timolol  2-0.5 % ophthalmic solution Commonly known as: COSOPT  Place 1 drop into both eyes 2 (two) times daily.   furosemide  40 MG tablet Commonly known as: LASIX  Take 2 tablets (80 mg total) by mouth 2 (two) times daily. At breakfast and again in the early afternoonFor swelling   gabapentin  300 MG capsule Commonly known as: Neurontin  Take 2 capsules (600 mg total) by mouth 2 (two) times daily.   hydrALAZINE  50 MG tablet Commonly known as: APRESOLINE  Take 50 mg by mouth 3 (  three) times daily.   latanoprost  0.005 % ophthalmic solution Commonly known as: XALATAN  Place 1 drop into both eyes at bedtime.   lisinopril  10 MG tablet Commonly known as: ZESTRIL  Take 1 tablet (10 mg total) by mouth daily.   metoprolol  succinate 50 MG 24 hr tablet Commonly known as: TOPROL -XL Take 1 tablet (50 mg total) by mouth daily. Take with or immediately following a meal.   potassium chloride  10 MEQ tablet Commonly known as: KLOR-CON  Take 1 tablet (10 mEq total) by mouth 2 (two) times daily.   traZODone  50 MG tablet Commonly known as: DESYREL  Take 1 tablet (50 mg total) by mouth at bedtime.   triamcinolone  cream 0.1 % Commonly known as: KENALOG  Apply 1 Application topically 2 (two) times daily. to affected area(s)   Ventolin  HFA 108 (90 Base) MCG/ACT inhaler Generic drug: albuterol  Inhale 2 puffs into the lungs every 4 (four) hours as needed for wheezing or shortness of breath.         Follow-up: Return in about 3 months (around 01/29/2024).  Butler Der, M.D.

## 2023-10-30 LAB — BMP8+EGFR
BUN/Creatinine Ratio: 18 (ref 10–24)
BUN: 20 mg/dL (ref 8–27)
CO2: 24 mmol/L (ref 20–29)
Calcium: 9.5 mg/dL (ref 8.6–10.2)
Chloride: 99 mmol/L (ref 96–106)
Creatinine, Ser: 1.11 mg/dL (ref 0.76–1.27)
Glucose: 85 mg/dL (ref 70–99)
Potassium: 4.5 mmol/L (ref 3.5–5.2)
Sodium: 139 mmol/L (ref 134–144)
eGFR: 64 mL/min/{1.73_m2} (ref >59)

## 2023-11-04 ENCOUNTER — Other Ambulatory Visit: Payer: Self-pay | Admitting: Family Medicine

## 2023-11-04 NOTE — Telephone Encounter (Unsigned)
 Copied from CRM [id number]. Topic: Clinical - Medication Refill >> Nov 04, 2023  9:42 AM Susanna ORN wrote: Medication: potassium chloride  (KLOR-CON ) 10 MEQ tablet  Has the patient contacted their pharmacy? Yes, patient's wife spoke with pharmacy. (Agent: If no, request that the patient contact the pharmacy for the refill. If patient does not wish to contact the pharmacy document the reason why and proceed with request.) (Agent: If yes, when and what did the pharmacy advise?)  This is the patient's preferred pharmacy:  [facility] [place], [place] - [address] [place] [address] Phone: [phone] Fax: [phone]  Is this the correct pharmacy for this prescription? Yes If no, delete pharmacy and type the correct one.   Has the prescription been filled recently? Yes  Is the patient out of the medication? Yes  Has the patient been seen for an appointment in the last year OR does the patient have an upcoming appointment? Yes  Can we respond through MyChart? No  Agent: Please be advised that Rx refills may take up to 3 business days. We ask that you follow-up with your pharmacy.

## 2023-11-05 ENCOUNTER — Ambulatory Visit: Payer: Self-pay | Admitting: Family Medicine

## 2023-11-05 ENCOUNTER — Encounter: Payer: Self-pay | Admitting: Family Medicine

## 2023-11-05 MED ORDER — POTASSIUM CHLORIDE ER 10 MEQ PO TBCR: 10.0000 meq | EXTENDED_RELEASE_TABLET | Freq: Two times a day (BID) | ORAL | Status: AC

## 2023-11-05 NOTE — Progress Notes (Signed)
Hello Jerry Fox,  Your lab result is normal and/or stable.Some minor variations that are not significant are commonly marked abnormal, but do not represent any medical problem for you.  Best regards, Maryah Marinaro, M.D.

## 2023-11-20 ENCOUNTER — Encounter: Payer: Self-pay | Admitting: Cardiovascular Disease

## 2023-11-20 ENCOUNTER — Ambulatory Visit: Attending: Cardiovascular Disease | Admitting: Cardiovascular Disease

## 2023-11-20 VITALS — BP 104/60 | HR 68 | Ht 70.0 in | Wt 214.0 lb

## 2023-11-20 DIAGNOSIS — D6869 Other thrombophilia: Secondary | ICD-10-CM | POA: Diagnosis not present

## 2023-11-20 DIAGNOSIS — I10 Essential (primary) hypertension: Secondary | ICD-10-CM

## 2023-11-20 DIAGNOSIS — Z86711 Personal history of pulmonary embolism: Secondary | ICD-10-CM | POA: Diagnosis not present

## 2023-11-20 DIAGNOSIS — I4821 Permanent atrial fibrillation: Secondary | ICD-10-CM | POA: Diagnosis not present

## 2023-11-20 DIAGNOSIS — I7143 Infrarenal abdominal aortic aneurysm, without rupture: Secondary | ICD-10-CM

## 2023-11-20 DIAGNOSIS — R7303 Prediabetes: Secondary | ICD-10-CM

## 2023-11-20 DIAGNOSIS — R600 Localized edema: Secondary | ICD-10-CM

## 2023-11-20 MED ORDER — LISINOPRIL 2.5 MG PO TABS: 2.5000 mg | ORAL_TABLET | Freq: Every day | ORAL | 3 refills | Status: DC

## 2023-11-20 NOTE — Progress Notes (Signed)
 Cardiology Office Note:    Date:  11/20/2023   ID:  Jerry Fox, DOB 1933-05-14, MRN [id number]  PCP:  Zollie Lowers, MD   [facility] Providers Cardiologist:  None     Referring MD: Zollie Lowers, MD   No chief complaint on file.   History of Present Illness:    Jerry Fox is a 89 y.o. male veteran with a hx of permanent atrial fibrillation, AAA, hypertension, remote history of saddle pulmonary embolism due to left lower extremity DVT in 2014, hypercholesterolemia, dementia.  He is accompanied as always by his wife, Jerry Fox.  They will soon be celebrating their 65th wedding anniversary.  History and review of systems is primarily obtained from his wife, since Jerry Fox has significant cognitive and memory issues.  He receives much of his care in the [facility] system.  He gets around with a walker, but walks fairly briskly to the bathroom in the office today and appeared pretty steady.  They have been concerned about his low blood pressure.  On July 1 his blood pressure was 93/60.  At home she has recorded a blood pressure as low as 84/42.  Today's blood pressure is borderline at 104/60.  Hydralazine  is listed on their medication list, but she believes this was stopped and he is no longer taking it.  He also takes metoprolol  for A-fib rate control and lisinopril .  He has not had syncope.  He does not report dizziness, but it is unclear whether he remembers previous symptoms.  He does not have problems with chest pain or shortness of breath.  He denies dizziness or palpitations and has not experienced any falls.  He has not had any serious bleeding problems on Eliquis  anticoagulation.  He denies abdominal discomfort.  He has not had any recent focal neurological complaints.  He has lost about 20 pounds in the last year.  Measurements have been a little variable.  Most recently measured at 4.8 cm, although in July 2023 it was measured at 5.2 cm.  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    Past Medical History:  Diagnosis Date   AAA (abdominal aortic aneurysm) (HCC) 05/31/2012   3.8 cm 2/14   Arthritis    Atrial fibrillation (HCC) 05/31/2012   acute pulmonary embolus   Colon polyps    Hyperlipidemia    Hypertension    Murmur, cardiac    New onset atrial fibrillation (HCC) 06/10/2012   Saddle pulmonary embolus (HCC) 05/31/2012   LLE DVT    Past Surgical History:  Procedure Laterality Date   TONSILLECTOMY      Current Medications: Current Meds  Medication Sig   acetaminophen  (TYLENOL ) 500 MG tablet Take 1,000 mg by mouth 3 (three) times daily.   apixaban  (ELIQUIS ) 5 MG TABS tablet Take 1 tablet (5 mg total) by mouth 2 (two) times daily.   Cholecalciferol  50 MCG (2000 UT) CAPS Take 1 capsule (2,000 Units total) by mouth daily.   dorzolamide -timolol  (COSOPT ) 22.3-6.8 MG/ML ophthalmic solution Place 1 drop into both eyes 2 (two) times daily.   furosemide  (LASIX ) 40 MG tablet Take 2 tablets (80 mg total) by mouth 2 (two) times daily. At breakfast and again in the early afternoonFor swelling   gabapentin  (NEURONTIN ) 300 MG capsule Take 2 capsules (600 mg total) by mouth 2 (two) times daily.   latanoprost  (XALATAN ) 0.005 % ophthalmic solution Place 1 drop into both eyes at bedtime.  metoprolol  succinate (TOPROL -XL) 50 MG 24 hr tablet Take 1 tablet (50 mg total) by mouth daily. Take with or immediately following a meal.   potassium chloride  (KLOR-CON ) 10 MEQ tablet Take 1 tablet (10 mEq total) by mouth 2 (two) times daily.   traZODone  (DESYREL ) 50 MG tablet Take 1 tablet (50 mg total) by mouth at bedtime.   [DISCONTINUED] lisinopril  (ZESTRIL ) 10 MG tablet Take 1 tablet (10 mg total) by mouth daily.     Allergies:   Diltiazem    Family History: The patient's family history includes Arthritis in his father and mother; Congestive Heart Failure in his brother, sister, and sister; Heart disease  in his father, mother, and paternal grandfather; Hypertension in his father; Stroke in his brother.  ROS:   Please see the history of present illness.     All other systems reviewed and are negative.  EKGs/Labs/Other Studies Reviewed:    The following studies were reviewed today:  EKG Interpretation Date/Time:  Wednesday November 20 2023 11:47:49 EDT Ventricular Rate:  68 PR Interval:    QRS Duration:  114 QT Interval:  436 QTC Calculation: 463 R Axis:   16  Text Interpretation: Atrial fibrillation When compared with ECG of 20-May-2023 14:48, No significant change was found Confirmed by Jashanti Clinkscale ([id number]) on 11/20/2023 12:04:03 PM    Recent Labs: 02/07/2023: Magnesium 2.1 08/05/2023: Hemoglobin 13.4; Platelets 223 08/13/2023: ALT 16; BNP 315.5 10/29/2023: BUN 20; Creatinine, Ser 1.11; Potassium 4.5; Sodium 139  Recent Lipid Panel    Component Value Date/Time   CHOL 104 11/26/2022 1238   TRIG 107 11/26/2022 1238   HDL 40 11/26/2022 1238   CHOLHDL 2.6 11/26/2022 1238   CHOLHDL 4 08/14/2017 1035   VLDL 21.6 08/14/2017 1035   LDLCALC 44 11/26/2022 1238     Risk Assessment/Calculations:    CHA2DS2-VASc Score = 4   This indicates a 4.8% annual risk of stroke. The patient's score is based upon: CHF History: 0 HTN History: 1 Diabetes History: 0 Stroke History: 0 Vascular Disease History: 1 Age Score: 2 Gender Score: 0           Physical Exam:    VS:  BP 104/60 (BP Location: Left Arm, Patient Position: Sitting)   Pulse 68   Ht 5' 10 (1.778 m)   Wt 214 lb (97.1 kg)   SpO2 98%   BMI 30.71 kg/m     Wt Readings from Last 3 Encounters:  11/20/23 214 lb (97.1 kg)  10/29/23 206 lb (93.4 kg)  08/28/23 210 lb (95.3 kg)      General: Alert, oriented x3, no distress, overweight, borderline obese Head: no evidence of trauma, PERRL, EOMI, no exophtalmos or lid lag, no myxedema, no xanthelasma; normal ears, nose and oropharynx Neck: normal jugular venous pulsations  and no hepatojugular reflux; brisk carotid pulses without delay and no carotid bruits Chest: clear to auscultation, no signs of consolidation by percussion or palpation, normal fremitus, symmetrical and full respiratory excursions Cardiovascular: normal position and quality of the apical impulse, irregular rhythm, normal first and second heart sounds, no murmurs, rubs or gallops Abdomen: no tenderness or distention, no masses by palpation, no abnormal pulsatility or arterial bruits, normal bowel sounds, no hepatosplenomegaly Extremities: Bilateral 2+ soft pitting pedal and ankle edema Neurological: grossly nonfocal Psych: Normal mood and affect   ASSESSMENT:    1. Permanent atrial fibrillation (HCC)   2. Acquired thrombophilia (HCC)   3. Infrarenal abdominal aortic aneurysm (AAA) without rupture (HCC)   4.  Primary hypertension   5. Prediabetes   6. Lower extremity edema   7. History of pulmonary embolism    PLAN:    In order of problems listed above:  AFib: Well rate controlled on metoprolol , appropriately anticoagulated. Anticoagulation: Not had falls or bleeding problems. AAA: Asymptomatic.  Most recent evaluation shows a slight decrease in diameter at 4.8 cm compared to 5.2 cm the year before.  Reevaluate next February.  If it reaches 5.5 cm we should consider EVAR. HTN: Excessively well-controlled.  He is not taking hydralazine  we will remove this from his list.  Reduce lisinopril  to 2.5 mg daily.  Continue the metoprolol  which is serving A-fib rate control.  Send a blood pressure log in about a week.  Once his blood pressure normalizes, avoid drinking Gatorade since this will worsen his lower extremity edema. PreDM: Very mild elevation in hemoglobin A 1C at 5.8% last year.  Avoid excessive intake of sugary and starchy foods. HLP: Excellent lipid profile last July.  Followed by PCP. Lower extremity edema: Drinking sodium rich fluids will help his blood pressure would worsen the  edema.  Retreats on blood pressure medicine so that we can avoid Gatorade.  Try to keep legs elevated.  He will not wear compression stockings.  May have some component of post thrombotic venous insufficiency. Remote history of saddle pulmonary embolism: No evidence of recurrence.  He will remain on lifelong anticoagulation for atrial fibrillation anyway.  No recurrence in the last 10 years. Dementia: Take this in consideration when planning invasive or aggressive therapies.           Medication Adjustments/Labs and Tests Ordered: Current medicines are reviewed at length with the patient today.  Concerns regarding medicines are outlined above.  Orders Placed This Encounter  Procedures   EKG 12-Lead   Meds ordered this encounter  Medications   lisinopril  (ZESTRIL ) 2.5 MG tablet    Sig: Take 1 tablet (2.5 mg total) by mouth daily.    Dispense:  90 tablet    Refill:  3    There are no Patient Instructions on file for this visit.   Signed, Jerel Balding, MD  11/20/2023 12:33 PM    [facility]

## 2023-11-20 NOTE — Patient Instructions (Addendum)
 Medication Instructions:  STOP taking Hydralazine  Decrease Lisinopril  to 2.5 mg daily *If you need a refill on your cardiac medications before your next appointment, please call your pharmacy*  Lab Work: None ordered If you have labs (blood work) drawn today and your tests are completely normal, you will receive your results only by: MyChart Message (if you have MyChart) OR A paper copy in the mail If you have any lab test that is abnormal or we need to change your treatment, we will call you to review the results.  Testing/Procedures: Get AAA scan in February 2026 Your physician has requested that you have an abdominal aorta duplex. During this test, an ultrasound is used to evaluate the aorta. Allow 30 minutes for this exam. Do not eat after midnight the day before and avoid carbonated beverages.  Please note: We ask at that you not bring children with you during ultrasound (echo/ vascular) testing. Due to room size and safety concerns, children are not allowed in the ultrasound rooms during exams. Our front office staff cannot provide observation of children in our lobby area while testing is being conducted. An adult accompanying a patient to their appointment will only be allowed in the ultrasound room at the discretion of the ultrasound technician under special circumstances. We apologize for any inconvenience.   Follow-Up: At [facility], you and your health needs are our priority.  As part of our continuing mission to provide you with exceptional heart care, our providers are all part of one team.  This team includes your primary Cardiologist (physician) and Advanced Practice Providers or APPs (Physician Assistants and Nurse Practitioners) who all work together to provide you with the care you need, when you need it.  Your next appointment:   1 year(s)  Provider:   Dr Francyne  We recommend signing up for the patient portal called MyChart.  Sign up information is  provided on this After Visit Summary.  MyChart is used to connect with patients for Virtual Visits (Telemedicine).  Patients are able to view lab/test results, encounter notes, upcoming appointments, etc.  Non-urgent messages can be sent to your provider as well.   To learn more about what you can do with MyChart, go to [email or web address].

## 2023-11-22 ENCOUNTER — Telehealth: Payer: Self-pay | Admitting: Family Medicine

## 2023-11-22 NOTE — Telephone Encounter (Signed)
 Copied from CRM [id number]. Topic: Clinical - Medical Advice >> Nov 22, 2023  2:50 PM Everette C wrote: Reason for CRM: The patient's wife would like to please be contacted by a member of staff to further discuss a renewal of their community care services

## 2023-11-25 NOTE — Telephone Encounter (Signed)
 NA/VM full, filling our [facility] provider request for service form, thinking this is what the pt is requesting that we have done in the past and placing on PCP's desk.

## 2023-12-03 NOTE — Telephone Encounter (Addendum)
 [facility] provider medical request for service form - tried to fax to [phone] which is were I sent it to last time, there was no answer, emailed to [email or web address] which is what I found on a form under media  Not able to reach pt on either phone #, no VM

## 2023-12-24 ENCOUNTER — Other Ambulatory Visit: Payer: Self-pay | Admitting: Family Medicine

## 2023-12-26 ENCOUNTER — Telehealth: Payer: Self-pay | Admitting: Cardiovascular Disease

## 2023-12-26 DIAGNOSIS — Z79899 Other long term (current) drug therapy: Secondary | ICD-10-CM

## 2023-12-26 DIAGNOSIS — R600 Localized edema: Secondary | ICD-10-CM

## 2023-12-26 NOTE — Telephone Encounter (Signed)
 Those blood pressure readings look great.  Okay to stay off the lisinopril .  Remember to avoid drinking sodium rich beverages such as Gatorade to avoid worsening the edema.  Please let us  know if the blood pressure gets too low on the current dose of diuretic.

## 2023-12-26 NOTE — Telephone Encounter (Signed)
 Pt c/o BP issue: STAT if pt c/o blurred vision, one-sided weakness or slurred speech.  STAT if BP is GREATER than 180/120 TODAY.  STAT if BP is LESS than 90/60 and SYMPTOMATIC TODAY  1. What is your BP concern? Pt's wife is calling to give BP readings per Dr. Fanny request  2. Have you taken any BP medication today? No  3. What are your last 5 BP readings? 129/77 124/62 117/64 102/66 110/73  4. Are you having any other symptoms (ex. Dizziness, headache, blurred vision, passed out)? No   Pt's wife is calling on pt's behalf Pt's edema has gone down Taking furosemide  and potassium to bring BP down Hasn't been taking lisinopril  because it brings BP down too much

## 2023-12-26 NOTE — Telephone Encounter (Signed)
 Returned call, voicemail box is full.  Unable to leave a message.

## 2024-01-06 NOTE — Telephone Encounter (Signed)
 Ginnie (dpr) says she has been Giving: Furosemide  80 mg day and potassium 10 meq a day and not giving lisinopril  bc it was dropping bp too low? And she was afraid of messing up his kidneys. The furosemide  and potassium is supposed to be twice daily, but she has only been giving it once daily. I asked if he is swollen and/or short of breath. Denies any sob, but she says he is swollen- feet, ankles, halfway up legs. Informed her that Dr C said it is ok to discontinue the Lisinopril . Instructed her to start giving him the correct dose- Furosemide  80 mg in morning and afternoon Potassium 10 meq morning and afternoon. She verbalized understanding.  Informed her that I will give this information to Dr Francyne and call with any recommendations. (Informed that he may need to get updated lab work)

## 2024-01-07 NOTE — Telephone Encounter (Signed)
 Yes, please check a BNP and a BMP in 1 week.

## 2024-01-09 NOTE — Telephone Encounter (Signed)
 Croitoru, Jerel, MD to Me (Selected Message)    01/07/24  9:05 AM Note Yes, please check a BNP and a BMP in 1 week.     Jerry Fox (dpr) the information above. Asked to come on Monday or Tuesday to get lab work completed. She will come to our office to get this done for the patient. No lab slip needed.   Orders placed.

## 2024-01-14 DIAGNOSIS — Z5181 Encounter for therapeutic drug level monitoring: Secondary | ICD-10-CM | POA: Diagnosis not present

## 2024-01-14 DIAGNOSIS — Z79899 Other long term (current) drug therapy: Secondary | ICD-10-CM | POA: Diagnosis not present

## 2024-01-14 DIAGNOSIS — R600 Localized edema: Secondary | ICD-10-CM | POA: Diagnosis not present

## 2024-01-17 ENCOUNTER — Ambulatory Visit: Payer: Self-pay | Admitting: Cardiovascular Disease

## 2024-01-17 LAB — BASIC METABOLIC PANEL WITH GFR
BUN/Creatinine Ratio: 19 (ref 10–24)
BUN: 18 mg/dL (ref 8–27)
CO2: 23 mmol/L (ref 20–29)
Calcium: 9 mg/dL (ref 8.6–10.2)
Chloride: 102 mmol/L (ref 96–106)
Creatinine, Ser: 0.95 mg/dL (ref 0.76–1.27)
Glucose: 81 mg/dL (ref 70–99)
Potassium: 4 mmol/L (ref 3.5–5.2)
Sodium: 142 mmol/L (ref 134–144)
eGFR: 77 mL/min/1.73 (ref >59)

## 2024-01-17 LAB — BRAIN NATRIURETIC PEPTIDE: BNP: 205.9 pg/mL — AB (ref 0.0–100.0)

## 2024-01-29 ENCOUNTER — Ambulatory Visit (INDEPENDENT_AMBULATORY_CARE_PROVIDER_SITE_OTHER): Admitting: Family Medicine

## 2024-01-29 ENCOUNTER — Encounter: Payer: Self-pay | Admitting: Family Medicine

## 2024-01-29 VITALS — BP 132/71 | HR 73 | Temp 98.2°F | Ht 70.0 in | Wt 218.0 lb

## 2024-01-29 DIAGNOSIS — M545 Low back pain, unspecified: Secondary | ICD-10-CM

## 2024-01-29 DIAGNOSIS — G301 Alzheimer's disease with late onset: Secondary | ICD-10-CM | POA: Diagnosis not present

## 2024-01-29 DIAGNOSIS — I480 Paroxysmal atrial fibrillation: Secondary | ICD-10-CM | POA: Diagnosis not present

## 2024-01-29 DIAGNOSIS — G8929 Other chronic pain: Secondary | ICD-10-CM

## 2024-01-29 DIAGNOSIS — E785 Hyperlipidemia, unspecified: Secondary | ICD-10-CM

## 2024-01-29 DIAGNOSIS — F02A0 Dementia in other diseases classified elsewhere, mild, without behavioral disturbance, psychotic disturbance, mood disturbance, and anxiety: Secondary | ICD-10-CM | POA: Diagnosis not present

## 2024-01-29 NOTE — Progress Notes (Signed)
 Subjective:  Patient ID: WIL SLAPE, male    DOB: 12-08-1933  Age: 89 y.o. MRN: [id number]  CC: Medical Management of Chronic Issues   HPI  Discussed the use of AI scribe software for clinical note transcription with the patient, who gave verbal consent to proceed.  History of Present Illness Jerry Fox is an 89 year old male who presents for medication review and follow-up.  He has a history of blood clots, which previously required a six-day hospitalization due to a significant clot causing severe breathing difficulties. He is currently on Eliquis  for anticoagulation. No current issues with chest pain or shortness of breath, and he is not using his Ventolin  or albuterol  inhaler at this time.  His current medication regimen includes metoprolol , furosemide , potassium supplements, vitamin D , methotrexate, gabapentin  (two in the morning and two at night), Tylenol  (two in the morning and two at night, with an additional dose in the afternoon if needed for pain), and trazodone  for sleep. He also uses eye drops, specifically Cosopt  and latanoprost , for his eyes.  He mentions a weight gain of about twelve pounds since July 4th, which he attributes to dietary habits, particularly high triglycerides. He is not very physically active, often sitting in a chair, and does not elevate his feet regularly.          01/29/2024   11:42 AM 10/29/2023    1:15 PM 08/20/2023    3:22 PM  Depression screen PHQ 2/9  Decreased Interest 0 0 0  Down, Depressed, Hopeless 0 0 0  PHQ - 2 Score 0 0 0  Altered sleeping  0 0  Tired, decreased energy  0 0  Change in appetite  0 0  Feeling bad or failure about yourself   0 0  Trouble concentrating  0 0  Moving slowly or fidgety/restless  0 0  Suicidal thoughts  0 0  PHQ-9 Score  0 0  Difficult doing work/chores  Not difficult at all Not difficult at all    History Jerry Fox has a past medical history of AAA (abdominal aortic aneurysm) (05/31/2012),  Arthritis, Atrial fibrillation (HCC) (05/31/2012), Colon polyps, Hyperlipidemia, Hypertension, Murmur, cardiac, New onset atrial fibrillation (HCC) (06/10/2012), and Saddle pulmonary embolus (HCC) (05/31/2012).   He has a past surgical history that includes Tonsillectomy.   His family history includes Arthritis in his father and mother; Congestive Heart Failure in his brother, sister, and sister; Heart disease in his father, mother, and paternal grandfather; Hypertension in his father; Stroke in his brother.He reports that he quit smoking about 70 years ago. His smoking use included cigarettes. He started smoking about 80 years ago. He has a 10 pack-year smoking history. He quit smokeless tobacco use about 60 years ago.  His smokeless tobacco use included chew. He reports that he does not drink alcohol and does not use drugs.    ROS Review of Systems  Constitutional:  Negative for fever.  Respiratory:  Negative for shortness of breath.   Cardiovascular:  Negative for chest pain.  Musculoskeletal:  Negative for arthralgias.  Skin:  Negative for rash.    Objective:  BP 132/71   Pulse 73   Temp 98.2 F (36.8 C)   Ht 5' 10 (1.778 m)   Wt 218 lb (98.9 kg)   SpO2 97%   BMI 31.28 kg/m   BP Readings from Last 3 Encounters:  01/29/24 132/71  11/20/23 104/60  10/29/23 93/60    Wt Readings from Last 3 Encounters:  01/29/24 218 lb (98.9 kg)  11/20/23 214 lb (97.1 kg)  10/29/23 206 lb (93.4 kg)     Physical Exam Musculoskeletal:     Right lower leg: Edema present.     Left lower leg: Edema (2+) present.    Physical Exam GENERAL: Alert, cooperative, well developed, no acute distress. HEENT: Normocephalic, normal oropharynx, moist mucous membranes. CHEST: Clear to auscultation bilaterally, no wheezes, rhonchi, or crackles. CARDIOVASCULAR: Normal heart rate and rhythm, S1 and S2 normal without murmurs. ABDOMEN: Soft, non-tender, non-distended, without organomegaly, normal bowel  sounds. EXTREMITIES: No cyanosis or edema. NEUROLOGICAL: Cranial nerves grossly intact, moves all extremities without gross motor or sensory deficit.   Assessment & Plan:  Mild late onset Alzheimer's dementia without behavioral disturbance, psychotic disturbance, mood disturbance, or anxiety (HCC)  Paroxysmal atrial fibrillation (HCC)  Chronic low back pain without sciatica, unspecified back pain laterality  Hyperlipidemia, unspecified hyperlipidemia type    Assessment and Plan Assessment & Plan Heart failure   Continue current management.  Atrial fibrillation   Continue current management.  Pulmonary embolism   Significant pulmonary embolism required hospitalization and is currently managed with Eliquis  for anticoagulation.  Hypertriglyceridemia   Triglycerides are above 300 mg/dL, with a recent weight gain of 12 pounds since midsummer. Discussed the risk of plaque buildup and cardiovascular events, noting age-related significance of past plaque accumulation. Encourage dietary changes and physical activity, and recommend foot elevation.  Chronic pain   Chronic pain is managed with gabapentin  and Tylenol . Continue both medications.  Insomnia   Insomnia is managed with trazodone . Continue trazodone .  Glaucoma   Glaucoma is managed with Cosopt  and latanoprost  eye drops. Continue both eye drops.       Follow-up: Return in about 3 months (around 04/30/2024).  Butler Der, M.D.

## 2024-02-21 ENCOUNTER — Encounter: Payer: Self-pay | Admitting: *Deleted

## 2024-04-10 ENCOUNTER — Telehealth: Payer: Self-pay | Admitting: Family Medicine

## 2024-04-10 NOTE — Telephone Encounter (Unsigned)
 Copied from CRM #[id number]. Topic: Clinical - Medication Refill >> Apr 10, 2024  3:07 PM Harlene ORN wrote: Medication: traZODone  (DESYREL ) 50 MG tablet, apixaban  (ELIQUIS ) 5 MG TABS tablet  Has the patient contacted their pharmacy? Yes (Agent: If no, request that the patient contact the pharmacy for the refill. If patient does not wish to contact the pharmacy document the reason why and proceed with request.) (Agent: If yes, when and what did the pharmacy advise?)  This is the patient's preferred pharmacy:   [facility] - [place], [place] - [address] [place] [address] Phone: [phone] Fax: [phone]  Is this the correct pharmacy for this prescription? Yes If no, delete pharmacy and type the correct one.   Has the prescription been filled recently? No  Is the patient out of the medication? Yes  Has the patient been seen for an appointment in the last year OR does the patient have an upcoming appointment? Yes  Can we respond through MyChart? Yes  Agent: Please be advised that Rx refills may take up to 3 business days. We ask that you follow-up with your pharmacy.

## 2024-04-13 ENCOUNTER — Other Ambulatory Visit: Payer: Self-pay | Admitting: Family Medicine

## 2024-04-13 MED ORDER — TRAZODONE HCL 50 MG PO TABS: 50.0000 mg | ORAL_TABLET | Freq: Every day | ORAL | 3 refills | Status: AC

## 2024-04-13 MED ORDER — APIXABAN 5 MG PO TABS: 5.0000 mg | ORAL_TABLET | Freq: Two times a day (BID) | ORAL | 3 refills | Status: AC

## 2024-04-13 NOTE — Telephone Encounter (Signed)
 Please let the patient know that I sent their prescription to their pharmacy. Thanks, WS

## 2024-04-13 NOTE — Telephone Encounter (Signed)
 Unable to lm on vm due to full mailbox. LS

## 2024-04-20 ENCOUNTER — Encounter: Payer: Self-pay | Admitting: Family Medicine

## 2024-04-20 ENCOUNTER — Ambulatory Visit: Payer: Self-pay | Admitting: Family Medicine

## 2024-04-20 VITALS — BP 119/74 | HR 77 | Temp 97.9°F | Ht 70.0 in | Wt 221.0 lb

## 2024-04-20 DIAGNOSIS — I10 Essential (primary) hypertension: Secondary | ICD-10-CM

## 2024-04-20 DIAGNOSIS — G301 Alzheimer's disease with late onset: Secondary | ICD-10-CM

## 2024-04-20 DIAGNOSIS — R600 Localized edema: Secondary | ICD-10-CM

## 2024-04-20 DIAGNOSIS — F02A0 Dementia in other diseases classified elsewhere, mild, without behavioral disturbance, psychotic disturbance, mood disturbance, and anxiety: Secondary | ICD-10-CM | POA: Diagnosis not present

## 2024-04-20 MED ORDER — DONEPEZIL HCL 5 MG PO TABS: 5.0000 mg | ORAL_TABLET | Freq: Every day | ORAL | 0 refills | Status: AC

## 2024-04-20 NOTE — Progress Notes (Signed)
 "  Subjective:  Patient ID: Jerry Fox, male    DOB: 01/11/1934  Age: 89 y.o. MRN: [id number]  CC: Medical Management of Chronic Issues   HPI  Discussed the use of AI scribe software for clinical note transcription with the patient, who gave verbal consent to proceed.  History of Present Illness Jerry Fox is an 89 year old male who presents for a routine check-up and medication review.  He is here to review his medications, particularly focusing on Eliquis , a blood thinner. He has no issues with excessive bleeding, although he notes that it 'takes forever to get it to stop' when it does occur.  He has been experiencing minimal swelling in his legs, which is a concern for his caregiver. He uses diabetic socks and has lotion available by his chair.  He is scheduled for a follow-up regarding his aneurysm in early February, which is being monitored by a heart specialist in [place]. This is a known condition and part of his ongoing medical care.  His current medications include metoprolol , furosemide , potassium, and gabapentin . He is not currently taking any memory medications. However he has a poor short term memory and cognition is limited  He uses a urinal at night, indicating nocturia, but there is no mention of significant sleep disturbances. His caregiver notes that sometimes the urinal is quite full by morning.          04/20/2024    1:05 PM 01/29/2024   11:42 AM 10/29/2023    1:15 PM  Depression screen PHQ 2/9  Decreased Interest 3 0 0  Down, Depressed, Hopeless 0 0 0  PHQ - 2 Score 3 0 0  Altered sleeping 0  0  Tired, decreased energy 0  0  Change in appetite 0  0  Feeling bad or failure about yourself  0  0  Trouble concentrating 0  0  Moving slowly or fidgety/restless 0  0  Suicidal thoughts 0  0  PHQ-9 Score 3  0   Difficult doing work/chores Not difficult at all  Not difficult at all     Data saved with a previous flowsheet row definition     History [name] has a past medical history of AAA (abdominal aortic aneurysm) (05/31/2012), Arthritis, Atrial fibrillation (HCC) (05/31/2012), Colon polyps, Hyperlipidemia, Hypertension, Murmur, cardiac, New onset atrial fibrillation (HCC) (06/10/2012), and Saddle pulmonary embolus (HCC) (05/31/2012).   He has a past surgical history that includes Tonsillectomy.   His family history includes Arthritis in his father and mother; Congestive Heart Failure in his brother, sister, and sister; Heart disease in his father, mother, and paternal grandfather; Hypertension in his father; Stroke in his brother.He reports that he quit smoking about 70 years ago. His smoking use included cigarettes. He started smoking about 80 years ago. He has a 10 pack-year smoking history. He quit smokeless tobacco use about 60 years ago.  His smokeless tobacco use included chew. He reports that he does not drink alcohol and does not use drugs.    ROS Review of Systems  Constitutional: Negative.   HENT: Negative.    Eyes:  Negative for visual disturbance.  Respiratory:  Negative for cough and shortness of breath.   Cardiovascular:  Negative for chest pain and leg swelling.  Gastrointestinal:  Negative for abdominal pain, diarrhea, nausea and vomiting.  Genitourinary:  Negative for difficulty urinating.  Musculoskeletal:  Negative for arthralgias and myalgias.  Skin:  Negative for rash.  Neurological:  Negative for headaches.  Psychiatric/Behavioral:  Positive for confusion. Negative for sleep disturbance.     Objective:  BP 119/74   Pulse 77   Temp 97.9 F (36.6 C)   Ht 5' 10 (1.778 m)   Wt 221 lb (100.2 kg)   SpO2 97%   BMI 31.71 kg/m   BP Readings from Last 3 Encounters:  04/20/24 119/74  01/29/24 132/71  11/20/23 104/60    Wt Readings from Last 3 Encounters:  04/20/24 221 lb (100.2 kg)  01/29/24 218 lb (98.9 kg)  11/20/23 214 lb (97.1 kg)     Physical Exam Psychiatric:        Behavior:  Behavior is cooperative.        Cognition and Memory: Cognition is impaired. Memory is impaired. He exhibits impaired recent memory.    Physical Exam GENERAL: Alert, cooperative, well developed, no acute distress. HEENT: Normocephalic, normal oropharynx, moist mucous membranes. CHEST: Clear to auscultation bilaterally, no wheezes, rhonchi, or crackles. CARDIOVASCULAR: Normal heart rate and rhythm, S1 and S2 normal without murmurs. ABDOMEN: Soft, non-tender, non-distended, without organomegaly, normal bowel sounds. EXTREMITIES: Minimal swelling and mild pinkish color, no cyanosis or edema. NEUROLOGICAL: Cranial nerves grossly intact, moves all extremities without gross motor or sensory deficit.   Assessment & Plan:  Primary hypertension  Mild late onset Alzheimer's dementia without behavioral disturbance, psychotic disturbance, mood disturbance, or anxiety (HCC)  Edema of both lower legs -     BMP8+EGFR -     Brain natriuretic peptide  Other orders -     Donepezil  HCl; Take 1 tablet (5 mg total) by mouth at bedtime.  Dispense: 90 tablet; Refill: 0    Assessment and Plan Assessment & Plan Mild late onset Alzheimer's dementia   He has mild late onset Alzheimer's dementia and was not previously on memory medication. Discussed the potential benefits of donepezil  for memory improvement. Initiated donepezil , one tablet at bedtime. Potential side effects include nausea and dizziness, especially early in treatment. Advised on potential nausea and suggested a small bedtime snack if nausea occurs after the first few nights. Instructed to monitor for dizziness, especially if active at night.  Lower extremity edema   He has mild lower extremity edema with minimal swelling and mild pinkish discoloration, but no significant pitting edema. Emphasized the importance of regular leg elevation to manage swelling.  Atrial fibrillation   His atrial fibrillation is managed with Eliquis . No excessive  bleeding reported and heart function appears stable. Continue Eliquis  as prescribed.  General Health Maintenance   Routine health maintenance was discussed, including monitoring kidney function due to swelling. Ordered blood work to monitor kidney function.       Follow-up: Return in about 3 months (around 07/19/2024) for dementia.  Butler Der, M.D. "

## 2024-04-21 ENCOUNTER — Ambulatory Visit: Payer: Self-pay | Admitting: Family Medicine

## 2024-04-21 LAB — BMP8+EGFR
BUN/Creatinine Ratio: 12 (ref 10–24)
BUN: 12 mg/dL (ref 8–27)
CO2: 25 mmol/L (ref 20–29)
Calcium: 8.9 mg/dL (ref 8.6–10.2)
Chloride: 101 mmol/L (ref 96–106)
Creatinine, Ser: 0.97 mg/dL (ref 0.76–1.27)
Glucose: 108 mg/dL — ABNORMAL HIGH (ref 70–99)
Potassium: 4.5 mmol/L (ref 3.5–5.2)
Sodium: 140 mmol/L (ref 134–144)
eGFR: 75 mL/min/1.73

## 2024-04-21 LAB — BRAIN NATRIURETIC PEPTIDE: BNP: 127.4 pg/mL — ABNORMAL HIGH (ref 0.0–100.0)

## 2024-04-21 NOTE — Progress Notes (Signed)
Hello Jerry Fox,  Your lab result is normal and/or stable.Some minor variations that are not significant are commonly marked abnormal, but do not represent any medical problem for you.  Best regards, Maryah Marinaro, M.D.

## 2024-04-26 NOTE — Progress Notes (Signed)
Hello Keeghan,  Your lab result is normal and/or stable.Some minor variations that are not significant are commonly marked abnormal, but do not represent any medical problem for you.  Best regards, Maryah Marinaro, M.D.

## 2024-05-04 ENCOUNTER — Ambulatory Visit: Admitting: Family Medicine

## 2024-05-22 ENCOUNTER — Other Ambulatory Visit: Payer: Self-pay | Admitting: Cardiovascular Disease

## 2024-06-01 ENCOUNTER — Ambulatory Visit (HOSPITAL_COMMUNITY)

## 2024-07-07 ENCOUNTER — Ambulatory Visit: Payer: Self-pay

## 2024-07-14 ENCOUNTER — Ambulatory Visit

## 2024-07-20 ENCOUNTER — Ambulatory Visit: Admitting: Family Medicine
